# Patient Record
Sex: Female | Born: 1955 | Race: White | Hispanic: No | Marital: Single | Smoking: Current some day smoker
Health system: Southern US, Community
[De-identification: ages and names within clinical notes are randomized; demographics above are authoritative.]

## PROBLEM LIST (undated history)

## (undated) DIAGNOSIS — E785 Hyperlipidemia, unspecified: Secondary | ICD-10-CM

## (undated) DIAGNOSIS — G459 Transient cerebral ischemic attack, unspecified: Secondary | ICD-10-CM

## (undated) DIAGNOSIS — Z720 Tobacco use: Secondary | ICD-10-CM

## (undated) DIAGNOSIS — J449 Chronic obstructive pulmonary disease, unspecified: Secondary | ICD-10-CM

## (undated) DIAGNOSIS — J189 Pneumonia, unspecified organism: Secondary | ICD-10-CM

## (undated) DIAGNOSIS — F419 Anxiety disorder, unspecified: Secondary | ICD-10-CM

## (undated) DIAGNOSIS — I10 Essential (primary) hypertension: Secondary | ICD-10-CM

## (undated) HISTORY — PX: OTHER SURGICAL HISTORY: SHX169

## (undated) HISTORY — DX: Pneumonia, unspecified organism: J18.9

---

## 2005-08-22 ENCOUNTER — Emergency Department: Payer: Self-pay | Admitting: Emergency Medicine

## 2005-08-22 ENCOUNTER — Other Ambulatory Visit: Payer: Self-pay

## 2006-09-15 ENCOUNTER — Emergency Department: Payer: Self-pay | Admitting: Emergency Medicine

## 2006-11-17 ENCOUNTER — Emergency Department: Payer: Self-pay | Admitting: Unknown Physician Specialty

## 2007-06-06 ENCOUNTER — Emergency Department: Payer: Self-pay | Admitting: Emergency Medicine

## 2013-07-24 DIAGNOSIS — F17200 Nicotine dependence, unspecified, uncomplicated: Secondary | ICD-10-CM | POA: Diagnosis not present

## 2013-07-24 DIAGNOSIS — Z8673 Personal history of transient ischemic attack (TIA), and cerebral infarction without residual deficits: Secondary | ICD-10-CM | POA: Diagnosis not present

## 2013-07-24 DIAGNOSIS — J441 Chronic obstructive pulmonary disease with (acute) exacerbation: Secondary | ICD-10-CM | POA: Diagnosis not present

## 2013-07-24 DIAGNOSIS — J449 Chronic obstructive pulmonary disease, unspecified: Secondary | ICD-10-CM | POA: Diagnosis not present

## 2013-07-24 DIAGNOSIS — R079 Chest pain, unspecified: Secondary | ICD-10-CM | POA: Diagnosis not present

## 2013-07-27 DIAGNOSIS — J441 Chronic obstructive pulmonary disease with (acute) exacerbation: Secondary | ICD-10-CM | POA: Diagnosis not present

## 2013-07-27 DIAGNOSIS — F17200 Nicotine dependence, unspecified, uncomplicated: Secondary | ICD-10-CM | POA: Diagnosis not present

## 2013-07-27 DIAGNOSIS — J449 Chronic obstructive pulmonary disease, unspecified: Secondary | ICD-10-CM | POA: Diagnosis not present

## 2013-07-27 DIAGNOSIS — Z86718 Personal history of other venous thrombosis and embolism: Secondary | ICD-10-CM | POA: Diagnosis not present

## 2013-07-27 DIAGNOSIS — Z8673 Personal history of transient ischemic attack (TIA), and cerebral infarction without residual deficits: Secondary | ICD-10-CM | POA: Diagnosis not present

## 2013-07-27 DIAGNOSIS — Z86711 Personal history of pulmonary embolism: Secondary | ICD-10-CM | POA: Diagnosis not present

## 2013-10-24 DIAGNOSIS — J441 Chronic obstructive pulmonary disease with (acute) exacerbation: Secondary | ICD-10-CM | POA: Diagnosis not present

## 2013-10-24 DIAGNOSIS — J440 Chronic obstructive pulmonary disease with acute lower respiratory infection: Secondary | ICD-10-CM | POA: Diagnosis not present

## 2013-10-24 DIAGNOSIS — J449 Chronic obstructive pulmonary disease, unspecified: Secondary | ICD-10-CM | POA: Diagnosis not present

## 2013-10-24 DIAGNOSIS — F17200 Nicotine dependence, unspecified, uncomplicated: Secondary | ICD-10-CM | POA: Diagnosis not present

## 2013-10-24 DIAGNOSIS — R079 Chest pain, unspecified: Secondary | ICD-10-CM | POA: Diagnosis not present

## 2013-10-24 DIAGNOSIS — Z8673 Personal history of transient ischemic attack (TIA), and cerebral infarction without residual deficits: Secondary | ICD-10-CM | POA: Diagnosis not present

## 2013-10-24 DIAGNOSIS — Z882 Allergy status to sulfonamides status: Secondary | ICD-10-CM | POA: Diagnosis not present

## 2013-12-11 ENCOUNTER — Emergency Department: Payer: Self-pay | Admitting: Internal Medicine

## 2013-12-11 DIAGNOSIS — J449 Chronic obstructive pulmonary disease, unspecified: Secondary | ICD-10-CM | POA: Diagnosis not present

## 2013-12-11 DIAGNOSIS — J441 Chronic obstructive pulmonary disease with (acute) exacerbation: Secondary | ICD-10-CM | POA: Diagnosis not present

## 2013-12-11 DIAGNOSIS — J440 Chronic obstructive pulmonary disease with acute lower respiratory infection: Secondary | ICD-10-CM | POA: Diagnosis not present

## 2013-12-11 DIAGNOSIS — J438 Other emphysema: Secondary | ICD-10-CM | POA: Diagnosis not present

## 2013-12-11 DIAGNOSIS — R0602 Shortness of breath: Secondary | ICD-10-CM | POA: Diagnosis not present

## 2013-12-11 DIAGNOSIS — J984 Other disorders of lung: Secondary | ICD-10-CM | POA: Diagnosis not present

## 2013-12-11 DIAGNOSIS — J45901 Unspecified asthma with (acute) exacerbation: Secondary | ICD-10-CM | POA: Diagnosis not present

## 2013-12-11 LAB — CBC
HCT: 43.4 % (ref 35.0–47.0)
HGB: 15.2 g/dL (ref 12.0–16.0)
MCH: 33.5 pg (ref 26.0–34.0)
MCHC: 34.9 g/dL (ref 32.0–36.0)
MCV: 96 fL (ref 80–100)
Platelet: 267 10*3/uL (ref 150–440)
RBC: 4.52 10*6/uL (ref 3.80–5.20)
RDW: 13.7 % (ref 11.5–14.5)
WBC: 10.7 10*3/uL (ref 3.6–11.0)

## 2013-12-11 LAB — COMPREHENSIVE METABOLIC PANEL
ANION GAP: 2 — AB (ref 7–16)
AST: 34 U/L (ref 15–37)
Albumin: 4 g/dL (ref 3.4–5.0)
Alkaline Phosphatase: 89 U/L
BUN: 11 mg/dL (ref 7–18)
Bilirubin,Total: 0.3 mg/dL (ref 0.2–1.0)
CHLORIDE: 107 mmol/L (ref 98–107)
CREATININE: 0.8 mg/dL (ref 0.60–1.30)
Calcium, Total: 8.8 mg/dL (ref 8.5–10.1)
Co2: 27 mmol/L (ref 21–32)
EGFR (African American): >60
GLUCOSE: 96 mg/dL (ref 65–99)
Osmolality: 271 (ref 275–301)
POTASSIUM: 4.3 mmol/L (ref 3.5–5.1)
SGPT (ALT): 19 U/L (ref 12–78)
SODIUM: 136 mmol/L (ref 136–145)
Total Protein: 7.5 g/dL (ref 6.4–8.2)

## 2013-12-11 LAB — TROPONIN I: Troponin-I: <0.02 ng/mL

## 2013-12-17 DIAGNOSIS — Z8673 Personal history of transient ischemic attack (TIA), and cerebral infarction without residual deficits: Secondary | ICD-10-CM | POA: Diagnosis not present

## 2013-12-17 DIAGNOSIS — J441 Chronic obstructive pulmonary disease with (acute) exacerbation: Secondary | ICD-10-CM | POA: Diagnosis not present

## 2013-12-17 DIAGNOSIS — F17200 Nicotine dependence, unspecified, uncomplicated: Secondary | ICD-10-CM | POA: Diagnosis not present

## 2014-01-02 DIAGNOSIS — J438 Other emphysema: Secondary | ICD-10-CM | POA: Diagnosis not present

## 2014-01-02 DIAGNOSIS — F17200 Nicotine dependence, unspecified, uncomplicated: Secondary | ICD-10-CM | POA: Diagnosis not present

## 2014-01-02 DIAGNOSIS — J441 Chronic obstructive pulmonary disease with (acute) exacerbation: Secondary | ICD-10-CM | POA: Diagnosis not present

## 2014-01-02 DIAGNOSIS — Z8673 Personal history of transient ischemic attack (TIA), and cerebral infarction without residual deficits: Secondary | ICD-10-CM | POA: Diagnosis not present

## 2014-01-13 DIAGNOSIS — J441 Chronic obstructive pulmonary disease with (acute) exacerbation: Secondary | ICD-10-CM | POA: Diagnosis not present

## 2014-01-13 DIAGNOSIS — F17200 Nicotine dependence, unspecified, uncomplicated: Secondary | ICD-10-CM | POA: Diagnosis present

## 2014-01-13 DIAGNOSIS — J438 Other emphysema: Secondary | ICD-10-CM | POA: Diagnosis not present

## 2014-01-13 DIAGNOSIS — J984 Other disorders of lung: Secondary | ICD-10-CM | POA: Diagnosis not present

## 2014-01-13 DIAGNOSIS — R062 Wheezing: Secondary | ICD-10-CM | POA: Diagnosis not present

## 2014-01-13 DIAGNOSIS — Z882 Allergy status to sulfonamides status: Secondary | ICD-10-CM | POA: Diagnosis not present

## 2014-01-13 DIAGNOSIS — F411 Generalized anxiety disorder: Secondary | ICD-10-CM | POA: Diagnosis not present

## 2014-01-13 DIAGNOSIS — Z8673 Personal history of transient ischemic attack (TIA), and cerebral infarction without residual deficits: Secondary | ICD-10-CM | POA: Diagnosis not present

## 2014-01-13 DIAGNOSIS — R0902 Hypoxemia: Secondary | ICD-10-CM | POA: Diagnosis not present

## 2014-01-14 DIAGNOSIS — R0902 Hypoxemia: Secondary | ICD-10-CM | POA: Insufficient documentation

## 2014-01-14 DIAGNOSIS — R0689 Other abnormalities of breathing: Secondary | ICD-10-CM | POA: Insufficient documentation

## 2014-02-10 ENCOUNTER — Emergency Department: Payer: Self-pay | Admitting: Internal Medicine

## 2014-02-10 DIAGNOSIS — F17200 Nicotine dependence, unspecified, uncomplicated: Secondary | ICD-10-CM | POA: Diagnosis not present

## 2014-02-10 DIAGNOSIS — J209 Acute bronchitis, unspecified: Secondary | ICD-10-CM | POA: Diagnosis not present

## 2014-02-10 DIAGNOSIS — J441 Chronic obstructive pulmonary disease with (acute) exacerbation: Secondary | ICD-10-CM | POA: Diagnosis not present

## 2014-02-10 DIAGNOSIS — I10 Essential (primary) hypertension: Secondary | ICD-10-CM | POA: Diagnosis not present

## 2014-02-10 DIAGNOSIS — R0602 Shortness of breath: Secondary | ICD-10-CM | POA: Diagnosis not present

## 2014-02-10 DIAGNOSIS — J45909 Unspecified asthma, uncomplicated: Secondary | ICD-10-CM | POA: Diagnosis not present

## 2014-02-10 DIAGNOSIS — Z882 Allergy status to sulfonamides status: Secondary | ICD-10-CM | POA: Diagnosis not present

## 2014-02-10 DIAGNOSIS — J449 Chronic obstructive pulmonary disease, unspecified: Secondary | ICD-10-CM | POA: Diagnosis not present

## 2014-02-10 LAB — COMPREHENSIVE METABOLIC PANEL
ALBUMIN: 3.3 g/dL — AB (ref 3.4–5.0)
ANION GAP: 7 (ref 7–16)
Alkaline Phosphatase: 58 U/L
BUN: 11 mg/dL (ref 7–18)
Bilirubin,Total: 0.2 mg/dL (ref 0.2–1.0)
CALCIUM: 8.6 mg/dL (ref 8.5–10.1)
CO2: 26 mmol/L (ref 21–32)
CREATININE: 0.87 mg/dL (ref 0.60–1.30)
Chloride: 110 mmol/L — ABNORMAL HIGH (ref 98–107)
EGFR (African American): >60
GLUCOSE: 85 mg/dL (ref 65–99)
OSMOLALITY: 284 (ref 275–301)
POTASSIUM: 3.5 mmol/L (ref 3.5–5.1)
SGOT(AST): 15 U/L (ref 15–37)
SGPT (ALT): 17 U/L (ref 12–78)
Sodium: 143 mmol/L (ref 136–145)
TOTAL PROTEIN: 6.1 g/dL — AB (ref 6.4–8.2)

## 2014-02-10 LAB — CBC
HCT: 43 % (ref 35.0–47.0)
HGB: 14 g/dL (ref 12.0–16.0)
MCH: 32.1 pg (ref 26.0–34.0)
MCHC: 32.6 g/dL (ref 32.0–36.0)
MCV: 99 fL (ref 80–100)
Platelet: 306 10*3/uL (ref 150–440)
RBC: 4.36 10*6/uL (ref 3.80–5.20)
RDW: 13.6 % (ref 11.5–14.5)
WBC: 15.9 10*3/uL — ABNORMAL HIGH (ref 3.6–11.0)

## 2014-02-10 LAB — TROPONIN I

## 2014-02-20 DIAGNOSIS — F17200 Nicotine dependence, unspecified, uncomplicated: Secondary | ICD-10-CM | POA: Diagnosis not present

## 2014-02-20 DIAGNOSIS — Z8673 Personal history of transient ischemic attack (TIA), and cerebral infarction without residual deficits: Secondary | ICD-10-CM | POA: Diagnosis not present

## 2014-02-20 DIAGNOSIS — J438 Other emphysema: Secondary | ICD-10-CM | POA: Diagnosis not present

## 2014-02-20 DIAGNOSIS — J441 Chronic obstructive pulmonary disease with (acute) exacerbation: Secondary | ICD-10-CM | POA: Diagnosis not present

## 2014-03-24 ENCOUNTER — Emergency Department: Payer: Self-pay | Admitting: Emergency Medicine

## 2014-03-24 DIAGNOSIS — R079 Chest pain, unspecified: Secondary | ICD-10-CM | POA: Diagnosis not present

## 2014-03-24 DIAGNOSIS — F17200 Nicotine dependence, unspecified, uncomplicated: Secondary | ICD-10-CM | POA: Diagnosis not present

## 2014-03-24 DIAGNOSIS — R0602 Shortness of breath: Secondary | ICD-10-CM | POA: Diagnosis not present

## 2014-03-24 DIAGNOSIS — J441 Chronic obstructive pulmonary disease with (acute) exacerbation: Secondary | ICD-10-CM | POA: Diagnosis not present

## 2014-03-24 DIAGNOSIS — R062 Wheezing: Secondary | ICD-10-CM | POA: Diagnosis not present

## 2014-03-24 DIAGNOSIS — R0609 Other forms of dyspnea: Secondary | ICD-10-CM | POA: Diagnosis not present

## 2014-03-24 LAB — CBC
HCT: 41.6 % (ref 35.0–47.0)
HGB: 14.3 g/dL (ref 12.0–16.0)
MCH: 33.1 pg (ref 26.0–34.0)
MCHC: 34.3 g/dL (ref 32.0–36.0)
MCV: 97 fL (ref 80–100)
Platelet: 280 10*3/uL (ref 150–440)
RBC: 4.31 10*6/uL (ref 3.80–5.20)
RDW: 13.2 % (ref 11.5–14.5)
WBC: 10.4 10*3/uL (ref 3.6–11.0)

## 2014-03-24 LAB — BASIC METABOLIC PANEL
Anion Gap: 7 (ref 7–16)
BUN: 9 mg/dL (ref 7–18)
Calcium, Total: 8.9 mg/dL (ref 8.5–10.1)
Chloride: 107 mmol/L (ref 98–107)
Co2: 24 mmol/L (ref 21–32)
Creatinine: 0.72 mg/dL (ref 0.60–1.30)
Glucose: 114 mg/dL — ABNORMAL HIGH (ref 65–99)
Osmolality: 275 (ref 275–301)
POTASSIUM: 3.8 mmol/L (ref 3.5–5.1)
Sodium: 138 mmol/L (ref 136–145)

## 2014-03-24 LAB — TROPONIN I: Troponin-I: <0.02 ng/mL

## 2014-03-24 LAB — CK TOTAL AND CKMB (NOT AT ARMC)
CK, TOTAL: 96 U/L
CK-MB: 0.7 ng/mL (ref 0.5–3.6)

## 2014-04-09 ENCOUNTER — Inpatient Hospital Stay: Payer: Self-pay | Admitting: Internal Medicine

## 2014-04-09 DIAGNOSIS — F411 Generalized anxiety disorder: Secondary | ICD-10-CM | POA: Diagnosis not present

## 2014-04-09 DIAGNOSIS — Z6825 Body mass index (BMI) 25.0-25.9, adult: Secondary | ICD-10-CM | POA: Diagnosis not present

## 2014-04-09 DIAGNOSIS — R0609 Other forms of dyspnea: Secondary | ICD-10-CM | POA: Diagnosis not present

## 2014-04-09 DIAGNOSIS — R030 Elevated blood-pressure reading, without diagnosis of hypertension: Secondary | ICD-10-CM | POA: Diagnosis not present

## 2014-04-09 DIAGNOSIS — J9620 Acute and chronic respiratory failure, unspecified whether with hypoxia or hypercapnia: Secondary | ICD-10-CM | POA: Diagnosis not present

## 2014-04-09 DIAGNOSIS — J441 Chronic obstructive pulmonary disease with (acute) exacerbation: Secondary | ICD-10-CM | POA: Diagnosis not present

## 2014-04-09 DIAGNOSIS — J438 Other emphysema: Secondary | ICD-10-CM | POA: Diagnosis not present

## 2014-04-09 DIAGNOSIS — F17200 Nicotine dependence, unspecified, uncomplicated: Secondary | ICD-10-CM | POA: Diagnosis present

## 2014-04-09 DIAGNOSIS — Z23 Encounter for immunization: Secondary | ICD-10-CM | POA: Diagnosis not present

## 2014-04-09 DIAGNOSIS — E669 Obesity, unspecified: Secondary | ICD-10-CM | POA: Diagnosis not present

## 2014-04-09 DIAGNOSIS — R0602 Shortness of breath: Secondary | ICD-10-CM | POA: Diagnosis not present

## 2014-04-09 DIAGNOSIS — J9600 Acute respiratory failure, unspecified whether with hypoxia or hypercapnia: Secondary | ICD-10-CM | POA: Diagnosis not present

## 2014-04-09 LAB — CBC
HCT: 44.5 % (ref 35.0–47.0)
HGB: 15 g/dL (ref 12.0–16.0)
MCH: 32.5 pg (ref 26.0–34.0)
MCHC: 33.7 g/dL (ref 32.0–36.0)
MCV: 97 fL (ref 80–100)
PLATELETS: 287 10*3/uL (ref 150–440)
RBC: 4.61 10*6/uL (ref 3.80–5.20)
RDW: 13.1 % (ref 11.5–14.5)
WBC: 14.4 10*3/uL — AB (ref 3.6–11.0)

## 2014-04-09 LAB — COMPREHENSIVE METABOLIC PANEL
ALBUMIN: 3.9 g/dL (ref 3.4–5.0)
ALK PHOS: 74 U/L
ANION GAP: 10 (ref 7–16)
BILIRUBIN TOTAL: 0.3 mg/dL (ref 0.2–1.0)
BUN: 9 mg/dL (ref 7–18)
CHLORIDE: 108 mmol/L — AB (ref 98–107)
CREATININE: 0.91 mg/dL (ref 0.60–1.30)
Calcium, Total: 9.2 mg/dL (ref 8.5–10.1)
Co2: 21 mmol/L (ref 21–32)
EGFR (African American): >60
EGFR (Non-African Amer.): >60
Glucose: 115 mg/dL — ABNORMAL HIGH (ref 65–99)
Osmolality: 277 (ref 275–301)
POTASSIUM: 4.6 mmol/L (ref 3.5–5.1)
SGOT(AST): 19 U/L (ref 15–37)
SGPT (ALT): 22 U/L (ref 12–78)
SODIUM: 139 mmol/L (ref 136–145)
Total Protein: 7.7 g/dL (ref 6.4–8.2)

## 2014-04-09 LAB — CK TOTAL AND CKMB (NOT AT ARMC)
CK, Total: 104 U/L
CK-MB: 1.4 ng/mL (ref 0.5–3.6)

## 2014-04-09 LAB — TROPONIN I: Troponin-I: <0.02 ng/mL

## 2014-04-09 LAB — PRO B NATRIURETIC PEPTIDE: B-Type Natriuretic Peptide: 58 pg/mL (ref 0–125)

## 2014-04-14 LAB — CULTURE, BLOOD (SINGLE)

## 2014-05-22 DIAGNOSIS — F411 Generalized anxiety disorder: Secondary | ICD-10-CM | POA: Diagnosis not present

## 2014-05-22 DIAGNOSIS — J449 Chronic obstructive pulmonary disease, unspecified: Secondary | ICD-10-CM | POA: Diagnosis not present

## 2014-06-30 DIAGNOSIS — J40 Bronchitis, not specified as acute or chronic: Secondary | ICD-10-CM | POA: Diagnosis not present

## 2014-07-04 ENCOUNTER — Emergency Department: Payer: Self-pay | Admitting: Emergency Medicine

## 2014-07-04 DIAGNOSIS — R0602 Shortness of breath: Secondary | ICD-10-CM | POA: Diagnosis not present

## 2014-07-04 DIAGNOSIS — R5383 Other fatigue: Secondary | ICD-10-CM | POA: Diagnosis not present

## 2014-07-04 DIAGNOSIS — Z792 Long term (current) use of antibiotics: Secondary | ICD-10-CM | POA: Diagnosis not present

## 2014-07-04 DIAGNOSIS — J441 Chronic obstructive pulmonary disease with (acute) exacerbation: Secondary | ICD-10-CM | POA: Diagnosis not present

## 2014-07-04 DIAGNOSIS — J449 Chronic obstructive pulmonary disease, unspecified: Secondary | ICD-10-CM | POA: Diagnosis not present

## 2014-07-04 DIAGNOSIS — Z79899 Other long term (current) drug therapy: Secondary | ICD-10-CM | POA: Diagnosis not present

## 2014-07-04 DIAGNOSIS — IMO0002 Reserved for concepts with insufficient information to code with codable children: Secondary | ICD-10-CM | POA: Diagnosis not present

## 2014-07-04 DIAGNOSIS — R5381 Other malaise: Secondary | ICD-10-CM | POA: Diagnosis not present

## 2014-07-04 DIAGNOSIS — R079 Chest pain, unspecified: Secondary | ICD-10-CM | POA: Diagnosis not present

## 2014-07-04 DIAGNOSIS — J45909 Unspecified asthma, uncomplicated: Secondary | ICD-10-CM | POA: Diagnosis not present

## 2014-07-04 DIAGNOSIS — F17200 Nicotine dependence, unspecified, uncomplicated: Secondary | ICD-10-CM | POA: Diagnosis not present

## 2014-07-04 LAB — COMPREHENSIVE METABOLIC PANEL
ALK PHOS: 69 U/L
Albumin: 3.8 g/dL (ref 3.4–5.0)
Anion Gap: 9 (ref 7–16)
BILIRUBIN TOTAL: 0.2 mg/dL (ref 0.2–1.0)
BUN: 18 mg/dL (ref 7–18)
CREATININE: 0.97 mg/dL (ref 0.60–1.30)
Calcium, Total: 8.8 mg/dL (ref 8.5–10.1)
Chloride: 102 mmol/L (ref 98–107)
Co2: 24 mmol/L (ref 21–32)
EGFR (African American): >60
Glucose: 107 mg/dL — ABNORMAL HIGH (ref 65–99)
OSMOLALITY: 272 (ref 275–301)
POTASSIUM: 4 mmol/L (ref 3.5–5.1)
SGOT(AST): 11 U/L — ABNORMAL LOW (ref 15–37)
SGPT (ALT): 17 U/L
SODIUM: 135 mmol/L — AB (ref 136–145)
Total Protein: 7.4 g/dL (ref 6.4–8.2)

## 2014-07-04 LAB — CBC
HCT: 42.7 % (ref 35.0–47.0)
HGB: 13.5 g/dL (ref 12.0–16.0)
MCH: 30.4 pg (ref 26.0–34.0)
MCHC: 31.6 g/dL — AB (ref 32.0–36.0)
MCV: 96 fL (ref 80–100)
Platelet: 324 10*3/uL (ref 150–440)
RBC: 4.44 10*6/uL (ref 3.80–5.20)
RDW: 13.6 % (ref 11.5–14.5)
WBC: 17.9 10*3/uL — ABNORMAL HIGH (ref 3.6–11.0)

## 2014-07-04 LAB — PRO B NATRIURETIC PEPTIDE: B-TYPE NATIURETIC PEPTID: 219 pg/mL — AB (ref 0–125)

## 2014-07-04 LAB — CK TOTAL AND CKMB (NOT AT ARMC)
CK, Total: 141 U/L
CK-MB: 0.9 ng/mL (ref 0.5–3.6)

## 2014-07-04 LAB — TROPONIN I: Troponin-I: <0.02 ng/mL

## 2014-08-17 DIAGNOSIS — E785 Hyperlipidemia, unspecified: Secondary | ICD-10-CM | POA: Diagnosis not present

## 2014-08-17 DIAGNOSIS — R0602 Shortness of breath: Secondary | ICD-10-CM | POA: Diagnosis not present

## 2014-08-17 DIAGNOSIS — D6481 Anemia due to antineoplastic chemotherapy: Secondary | ICD-10-CM | POA: Diagnosis not present

## 2014-08-17 DIAGNOSIS — F17210 Nicotine dependence, cigarettes, uncomplicated: Secondary | ICD-10-CM | POA: Diagnosis not present

## 2014-08-17 DIAGNOSIS — I10 Essential (primary) hypertension: Secondary | ICD-10-CM | POA: Diagnosis not present

## 2014-08-17 DIAGNOSIS — J441 Chronic obstructive pulmonary disease with (acute) exacerbation: Secondary | ICD-10-CM | POA: Diagnosis not present

## 2014-08-17 DIAGNOSIS — Z23 Encounter for immunization: Secondary | ICD-10-CM | POA: Diagnosis not present

## 2014-08-30 DIAGNOSIS — R0602 Shortness of breath: Secondary | ICD-10-CM | POA: Diagnosis not present

## 2014-09-05 DIAGNOSIS — F17210 Nicotine dependence, cigarettes, uncomplicated: Secondary | ICD-10-CM | POA: Diagnosis not present

## 2014-09-05 DIAGNOSIS — E785 Hyperlipidemia, unspecified: Secondary | ICD-10-CM | POA: Diagnosis not present

## 2014-09-05 DIAGNOSIS — F411 Generalized anxiety disorder: Secondary | ICD-10-CM | POA: Diagnosis not present

## 2014-09-05 DIAGNOSIS — R0602 Shortness of breath: Secondary | ICD-10-CM | POA: Diagnosis not present

## 2014-09-05 DIAGNOSIS — I10 Essential (primary) hypertension: Secondary | ICD-10-CM | POA: Diagnosis not present

## 2014-09-21 DIAGNOSIS — R0602 Shortness of breath: Secondary | ICD-10-CM | POA: Diagnosis not present

## 2014-10-12 DIAGNOSIS — Z1231 Encounter for screening mammogram for malignant neoplasm of breast: Secondary | ICD-10-CM | POA: Diagnosis not present

## 2014-10-20 DIAGNOSIS — R928 Other abnormal and inconclusive findings on diagnostic imaging of breast: Secondary | ICD-10-CM | POA: Diagnosis not present

## 2014-10-20 DIAGNOSIS — R922 Inconclusive mammogram: Secondary | ICD-10-CM | POA: Diagnosis not present

## 2014-10-24 DIAGNOSIS — F17210 Nicotine dependence, cigarettes, uncomplicated: Secondary | ICD-10-CM | POA: Diagnosis not present

## 2014-10-24 DIAGNOSIS — R87613 High grade squamous intraepithelial lesion on cytologic smear of cervix (HGSIL): Secondary | ICD-10-CM | POA: Diagnosis not present

## 2014-10-24 DIAGNOSIS — R300 Dysuria: Secondary | ICD-10-CM | POA: Diagnosis not present

## 2014-10-24 DIAGNOSIS — F411 Generalized anxiety disorder: Secondary | ICD-10-CM | POA: Diagnosis not present

## 2014-10-24 DIAGNOSIS — Z0001 Encounter for general adult medical examination with abnormal findings: Secondary | ICD-10-CM | POA: Diagnosis not present

## 2014-10-24 DIAGNOSIS — J441 Chronic obstructive pulmonary disease with (acute) exacerbation: Secondary | ICD-10-CM | POA: Diagnosis not present

## 2014-10-24 DIAGNOSIS — Z124 Encounter for screening for malignant neoplasm of cervix: Secondary | ICD-10-CM | POA: Diagnosis not present

## 2014-10-24 DIAGNOSIS — R87810 Cervical high risk human papillomavirus (HPV) DNA test positive: Secondary | ICD-10-CM | POA: Diagnosis not present

## 2014-11-09 DIAGNOSIS — M8580 Other specified disorders of bone density and structure, unspecified site: Secondary | ICD-10-CM | POA: Diagnosis not present

## 2014-11-09 DIAGNOSIS — M859 Disorder of bone density and structure, unspecified: Secondary | ICD-10-CM | POA: Diagnosis not present

## 2014-12-03 DIAGNOSIS — Z7951 Long term (current) use of inhaled steroids: Secondary | ICD-10-CM | POA: Diagnosis not present

## 2014-12-03 DIAGNOSIS — R21 Rash and other nonspecific skin eruption: Secondary | ICD-10-CM | POA: Diagnosis not present

## 2014-12-03 DIAGNOSIS — J441 Chronic obstructive pulmonary disease with (acute) exacerbation: Secondary | ICD-10-CM | POA: Diagnosis not present

## 2014-12-03 DIAGNOSIS — F17210 Nicotine dependence, cigarettes, uncomplicated: Secondary | ICD-10-CM | POA: Diagnosis not present

## 2014-12-11 DIAGNOSIS — F17210 Nicotine dependence, cigarettes, uncomplicated: Secondary | ICD-10-CM | POA: Diagnosis not present

## 2014-12-11 DIAGNOSIS — J449 Chronic obstructive pulmonary disease, unspecified: Secondary | ICD-10-CM | POA: Diagnosis not present

## 2014-12-11 DIAGNOSIS — J069 Acute upper respiratory infection, unspecified: Secondary | ICD-10-CM | POA: Diagnosis not present

## 2014-12-11 DIAGNOSIS — F411 Generalized anxiety disorder: Secondary | ICD-10-CM | POA: Diagnosis not present

## 2014-12-11 DIAGNOSIS — R05 Cough: Secondary | ICD-10-CM | POA: Diagnosis not present

## 2014-12-11 DIAGNOSIS — I10 Essential (primary) hypertension: Secondary | ICD-10-CM | POA: Diagnosis not present

## 2014-12-29 DIAGNOSIS — R05 Cough: Secondary | ICD-10-CM | POA: Diagnosis not present

## 2014-12-29 DIAGNOSIS — R0602 Shortness of breath: Secondary | ICD-10-CM | POA: Diagnosis not present

## 2014-12-29 DIAGNOSIS — R079 Chest pain, unspecified: Secondary | ICD-10-CM | POA: Diagnosis not present

## 2014-12-29 DIAGNOSIS — J449 Chronic obstructive pulmonary disease, unspecified: Secondary | ICD-10-CM | POA: Diagnosis not present

## 2014-12-29 DIAGNOSIS — M940 Chondrocostal junction syndrome [Tietze]: Secondary | ICD-10-CM | POA: Diagnosis not present

## 2014-12-29 DIAGNOSIS — F17210 Nicotine dependence, cigarettes, uncomplicated: Secondary | ICD-10-CM | POA: Diagnosis not present

## 2015-01-27 NOTE — H&P (Signed)
PATIENT NAME:  Alicia Rivera, Alicia Rivera MR#:  [id number] DATE OF BIRTH:  05/13/1956  DATE OF ADMISSION:  04/09/2014.  PRIMARY CARE PHYSICIAN: None.   CHIEF COMPLAINT: Shortness of breath and wheezing. Alicia Rivera is a 59 year old Caucasian female with history of COPD, asthma, ongoing tobacco abuse, comes to the Emergency Room after she started having increasing shortness of breath today. She came in with severe shortness of breath, chest tightness and wheezing, was placed on BiPAP in the Emergency Room.  Her blood pressure on arrival was 235/133. The patient received IV Solu-Medrol, nebulizer treatment, and IV magnesium, as well. During my evaluation she was feeling better. She was placed on oxygen 4 liters and her oxygen saturation was 96%. She was able to complete a whole sentence. She denies any productive cough or any phlegm. She is being admitted for acute on chronic respiratory failure secondary to COPD exacerbation.   PAST MEDICAL HISTORY:  COPD, asthma.    ALLERGIES: SULFA DRUGS.   FAMILY HISTORY: Positive for hypertension.   SOCIAL HISTORY: She smokes about 1 pack a day. She used to smoke 3 packs a day. Denies any alcohol or drug use.   MEDICATIONS:  1.  DuoNebs 3 mL every six hours as needed.  2.  Ventolin HFA 2 puffs 4 times a day.  3.  Klonopin 0.1 - 1 mg one daily as needed for anxiety.   REVIEW OF SYSTEMS.  CONSTITUTIONAL: No fever, fatigue, weakness.  EYES: No blurred or double vision. No blurred or double vision, glaucoma or cataracts.  ENT: No tinnitus, ear pain, hearing loss or dysphagia.  RESPIRATORY: Positive for chest tightness, COPD/asthma. No productive phlegm. CARDIOVASCULAR: No chest pain, orthopnea, edema or hypertension.  GASTROINTESTINAL: No nausea, vomiting, diarrhea, abdominal pain or GERD.  GENITOURINARY: No dysuria, hematuria, frequency.  ENDOCRINE: No polyuria, nocturia or thyroid problems.  HEMATOLOGY: No anemia or easy bruising.  SKIN: No acne, rash or lesion.   MUSCULOSKELETAL: Positive for arthritis. No swelling or gout.  NEUROLOGIC: No CVA, transient ischemic attack, dysarthria or tremors.  PSYCHIATRIC: No anxiety, depression or bipolar disorder.   All other systems reviewed are negative.   EKG showed sinus tachycardia LAD.    LABORATORY DATA:  White count is 14.4. The rest of the hemogram within normal limits. Cardiac enzymes negative. Comprehensive metabolic panel within normal limits.   Chest x-ray shows no acute cardiopulmonary disease. Positive for emphysema.   ASSESSMENT: Alicia Rivera is a 59 year old with history of chronic obstructive pulmonary disease and asthma with ongoing tobacco abuse, comes in with:  1.  Acute on chronic respiratory failure secondary to chronic obstructive pulmonary disease exacerbation. The patient will be admitted on the medical floor. We will get her off the BiPAP. Continue her on nasal cannula oxygen. We will continue nebulizers around-the-clock along with oral inhalers and IV Solu-Medrol. Empiric antibiotics with IV Levaquin. Wean oxygen as tolerated.  2.  Tobacco abuse. The patient was counseled on smoking cessation, about 4 minutes spent. Does not seem patient is much motivated. She used to smoke 3 packs per day. She is asked to request for nicotine patch if she desires to do so.  3.  Elevated blood pressure without diagnosis of hypertension, likely due to chronic obstructive pulmonary disease exacerbation. Blood pressure during my evaluation was within normal limits. Continue to monitor blood pressure and consider antihypertensives as needed.  4.  DVT prophylaxis with subcutaneous heparin.   The above was discussed with the patient.   TIME SPENT: 50 minutes.  ____________________________ Wylie HailSona A. Allena KatzPatel, MD sap:lt D: 04/09/2014 13:52:15 ET T: 04/09/2014 15:11:58 ET JOB#: [id number]  cc: Isabella Roemmich A. Allena KatzPatel, MD, <Dictator> Willow OraSONA A Skylin Kennerson MD ELECTRONICALLY SIGNED 04/10/2014 10:43

## 2015-01-27 NOTE — Discharge Summary (Signed)
PATIENT NAME:  Alicia Rivera, Carroll A MR#:  [id number] DATE OF BIRTH:  13-Sep-1956  DATE OF ADMISSION:  04/09/2014 DATE OF DISCHARGE:  04/11/2014  PRIMARY CARE PHYSICIAN: None local.   DISCHARGE DIAGNOSES:  1.  Acute respiratory failure due to chronic obstructive pulmonary disease exacerbation.  2.  Tobacco abuse.   CONDITION: Stable.   CODE STATUS: FULL CODE.   HOME MEDICATIONS: Please refer to the medication reconciliation list.   DIET: Low-fat, low-cholesterol diet.   ACTIVITY: As tolerated.   FOLLOW-UP CARE: Follow-up with PCP within 1-2 weeks.   REASON FOR ADMISSION: Increasing shortness of breath with chest tightness and wheezing.   HOSPITAL COURSE: The patient is a 59 year old Caucasian female with a history of COPD,  asthma, and tobacco abuse who came to ED due to increasing shortness of breath, chest tightness, and wheezing. The patient was placed on BiPAP. In the ED, her blood pressure was high at 235 to 133. The patient received IV Solu Medrol, nebulizer, and magnesium, and admitted to medical floor.   For detailed history and physical examination, please refer to the admission note dictated by Dr. Enedina FinnerSona Patel. On admission date, the patient's WBC was 14.4.  Rest of hemogram was within normal limits.  Cardiac enzymes negative. CMP was within normal limits. The chest x-ray did not show any acute cardiopulmonary disease, but positive for emphysema.   1.  Acute respiratory failure secondary to COPD exacerbation. After admission, the patient was put on BiPAP and treated with IV steroids and nebulizer. The patient was off BiPAP yesterday and on home oxygen today. Today, patient is off oxygen by nasal cannula. The patient only has a mild cough. Denies any wheezing or shortness of breath.   2.  Elevated blood pressure is possibly due to COPD exacerbation. Blood pressure is under control.   3.  Patient's blood pressure has been in normal range after treatment of COPD exacerbation. The  patient is not on any hypertension medication.   4.  Patient's symptoms have much improved. Vital signs are stable. She is clinically stable and will be discharged to home today. I discussed the patient's discharge plan with the patient, nurse, case manager.   TIME SPENT: About 35 minutes.    ____________________________ Shaune PollackQing Alyzae Hawkey, MD qc:ts D: 04/11/2014 13:46:28 ET T: 04/11/2014 16:14:41 ET JOB#: [id number]  cc: Shaune PollackQing Edric Fetterman, MD, <Dictator> Shaune PollackQING Quinnetta Roepke MD ELECTRONICALLY SIGNED 04/11/2014 18:03

## 2015-01-29 DIAGNOSIS — F3342 Major depressive disorder, recurrent, in full remission: Secondary | ICD-10-CM | POA: Diagnosis not present

## 2015-01-29 DIAGNOSIS — I10 Essential (primary) hypertension: Secondary | ICD-10-CM | POA: Diagnosis not present

## 2015-01-29 DIAGNOSIS — J449 Chronic obstructive pulmonary disease, unspecified: Secondary | ICD-10-CM | POA: Diagnosis not present

## 2015-01-29 DIAGNOSIS — J309 Allergic rhinitis, unspecified: Secondary | ICD-10-CM | POA: Diagnosis not present

## 2015-04-03 DIAGNOSIS — I6521 Occlusion and stenosis of right carotid artery: Secondary | ICD-10-CM | POA: Diagnosis not present

## 2015-04-03 DIAGNOSIS — J449 Chronic obstructive pulmonary disease, unspecified: Secondary | ICD-10-CM | POA: Diagnosis not present

## 2015-04-03 DIAGNOSIS — R0602 Shortness of breath: Secondary | ICD-10-CM | POA: Diagnosis not present

## 2015-04-03 DIAGNOSIS — I10 Essential (primary) hypertension: Secondary | ICD-10-CM | POA: Diagnosis not present

## 2015-04-26 DIAGNOSIS — Z882 Allergy status to sulfonamides status: Secondary | ICD-10-CM | POA: Diagnosis not present

## 2015-04-26 DIAGNOSIS — J449 Chronic obstructive pulmonary disease, unspecified: Secondary | ICD-10-CM | POA: Diagnosis not present

## 2015-04-26 DIAGNOSIS — R0602 Shortness of breath: Secondary | ICD-10-CM | POA: Diagnosis not present

## 2015-04-26 DIAGNOSIS — I10 Essential (primary) hypertension: Secondary | ICD-10-CM | POA: Diagnosis not present

## 2015-04-26 DIAGNOSIS — F17210 Nicotine dependence, cigarettes, uncomplicated: Secondary | ICD-10-CM | POA: Diagnosis not present

## 2015-04-26 DIAGNOSIS — F419 Anxiety disorder, unspecified: Secondary | ICD-10-CM | POA: Diagnosis not present

## 2015-04-26 DIAGNOSIS — R079 Chest pain, unspecified: Secondary | ICD-10-CM | POA: Diagnosis not present

## 2015-04-26 DIAGNOSIS — Z7951 Long term (current) use of inhaled steroids: Secondary | ICD-10-CM | POA: Diagnosis not present

## 2015-04-26 DIAGNOSIS — Z8673 Personal history of transient ischemic attack (TIA), and cerebral infarction without residual deficits: Secondary | ICD-10-CM | POA: Diagnosis not present

## 2015-04-26 DIAGNOSIS — F410 Panic disorder [episodic paroxysmal anxiety] without agoraphobia: Secondary | ICD-10-CM | POA: Diagnosis not present

## 2015-05-30 DIAGNOSIS — I6523 Occlusion and stenosis of bilateral carotid arteries: Secondary | ICD-10-CM | POA: Diagnosis not present

## 2015-10-04 DIAGNOSIS — J449 Chronic obstructive pulmonary disease, unspecified: Secondary | ICD-10-CM | POA: Diagnosis not present

## 2015-10-04 DIAGNOSIS — F17210 Nicotine dependence, cigarettes, uncomplicated: Secondary | ICD-10-CM | POA: Diagnosis not present

## 2015-10-04 DIAGNOSIS — F411 Generalized anxiety disorder: Secondary | ICD-10-CM | POA: Diagnosis not present

## 2015-10-04 DIAGNOSIS — F3342 Major depressive disorder, recurrent, in full remission: Secondary | ICD-10-CM | POA: Diagnosis not present

## 2015-10-04 DIAGNOSIS — I6523 Occlusion and stenosis of bilateral carotid arteries: Secondary | ICD-10-CM | POA: Diagnosis not present

## 2016-01-26 DIAGNOSIS — R000 Tachycardia, unspecified: Secondary | ICD-10-CM | POA: Diagnosis not present

## 2016-01-26 DIAGNOSIS — F410 Panic disorder [episodic paroxysmal anxiety] without agoraphobia: Secondary | ICD-10-CM | POA: Diagnosis not present

## 2016-01-26 DIAGNOSIS — Z8673 Personal history of transient ischemic attack (TIA), and cerebral infarction without residual deficits: Secondary | ICD-10-CM | POA: Diagnosis not present

## 2016-01-26 DIAGNOSIS — J449 Chronic obstructive pulmonary disease, unspecified: Secondary | ICD-10-CM | POA: Diagnosis not present

## 2016-01-26 DIAGNOSIS — Z7951 Long term (current) use of inhaled steroids: Secondary | ICD-10-CM | POA: Diagnosis not present

## 2016-01-26 DIAGNOSIS — I10 Essential (primary) hypertension: Secondary | ICD-10-CM | POA: Diagnosis not present

## 2016-01-26 DIAGNOSIS — R0789 Other chest pain: Secondary | ICD-10-CM | POA: Diagnosis not present

## 2016-01-26 DIAGNOSIS — F17210 Nicotine dependence, cigarettes, uncomplicated: Secondary | ICD-10-CM | POA: Diagnosis not present

## 2016-01-29 DIAGNOSIS — F411 Generalized anxiety disorder: Secondary | ICD-10-CM | POA: Diagnosis not present

## 2016-01-29 DIAGNOSIS — F3342 Major depressive disorder, recurrent, in full remission: Secondary | ICD-10-CM | POA: Diagnosis not present

## 2016-01-29 DIAGNOSIS — I10 Essential (primary) hypertension: Secondary | ICD-10-CM | POA: Diagnosis not present

## 2016-01-29 DIAGNOSIS — F17210 Nicotine dependence, cigarettes, uncomplicated: Secondary | ICD-10-CM | POA: Diagnosis not present

## 2016-01-29 DIAGNOSIS — E860 Dehydration: Secondary | ICD-10-CM | POA: Diagnosis not present

## 2016-01-29 DIAGNOSIS — J449 Chronic obstructive pulmonary disease, unspecified: Secondary | ICD-10-CM | POA: Diagnosis not present

## 2016-02-28 DIAGNOSIS — Z23 Encounter for immunization: Secondary | ICD-10-CM | POA: Diagnosis not present

## 2016-02-28 DIAGNOSIS — F17210 Nicotine dependence, cigarettes, uncomplicated: Secondary | ICD-10-CM | POA: Diagnosis not present

## 2016-02-28 DIAGNOSIS — J449 Chronic obstructive pulmonary disease, unspecified: Secondary | ICD-10-CM | POA: Diagnosis not present

## 2016-02-28 DIAGNOSIS — F3342 Major depressive disorder, recurrent, in full remission: Secondary | ICD-10-CM | POA: Diagnosis not present

## 2016-02-28 DIAGNOSIS — I10 Essential (primary) hypertension: Secondary | ICD-10-CM | POA: Diagnosis not present

## 2016-02-28 DIAGNOSIS — Z0001 Encounter for general adult medical examination with abnormal findings: Secondary | ICD-10-CM | POA: Diagnosis not present

## 2016-02-28 DIAGNOSIS — F411 Generalized anxiety disorder: Secondary | ICD-10-CM | POA: Diagnosis not present

## 2016-04-07 DIAGNOSIS — Z1231 Encounter for screening mammogram for malignant neoplasm of breast: Secondary | ICD-10-CM | POA: Diagnosis not present

## 2016-07-09 DIAGNOSIS — F17210 Nicotine dependence, cigarettes, uncomplicated: Secondary | ICD-10-CM | POA: Diagnosis not present

## 2016-07-09 DIAGNOSIS — J441 Chronic obstructive pulmonary disease with (acute) exacerbation: Secondary | ICD-10-CM | POA: Diagnosis not present

## 2016-07-09 DIAGNOSIS — F3342 Major depressive disorder, recurrent, in full remission: Secondary | ICD-10-CM | POA: Diagnosis not present

## 2016-07-09 DIAGNOSIS — F411 Generalized anxiety disorder: Secondary | ICD-10-CM | POA: Diagnosis not present

## 2016-07-10 ENCOUNTER — Ambulatory Visit
Admission: RE | Admit: 2016-07-10 | Discharge: 2016-07-10 | Disposition: A | Discharge status: Home / Self Care | Payer: Medicare Other | Source: Ambulatory Visit | Attending: Physician Assistant | Admitting: Physician Assistant

## 2016-07-10 ENCOUNTER — Other Ambulatory Visit: Payer: Self-pay | Admitting: Physician Assistant

## 2016-07-10 DIAGNOSIS — R05 Cough: Secondary | ICD-10-CM | POA: Insufficient documentation

## 2016-07-10 DIAGNOSIS — R062 Wheezing: Secondary | ICD-10-CM

## 2016-07-10 DIAGNOSIS — R059 Cough, unspecified: Secondary | ICD-10-CM

## 2016-07-10 DIAGNOSIS — R0602 Shortness of breath: Secondary | ICD-10-CM | POA: Diagnosis not present

## 2016-07-17 DIAGNOSIS — R0602 Shortness of breath: Secondary | ICD-10-CM | POA: Diagnosis not present

## 2016-07-17 DIAGNOSIS — Z23 Encounter for immunization: Secondary | ICD-10-CM | POA: Diagnosis not present

## 2016-07-17 DIAGNOSIS — F17210 Nicotine dependence, cigarettes, uncomplicated: Secondary | ICD-10-CM | POA: Diagnosis not present

## 2016-07-17 DIAGNOSIS — J449 Chronic obstructive pulmonary disease, unspecified: Secondary | ICD-10-CM | POA: Diagnosis not present

## 2016-08-06 DIAGNOSIS — R0602 Shortness of breath: Secondary | ICD-10-CM | POA: Diagnosis not present

## 2016-08-18 DIAGNOSIS — F17210 Nicotine dependence, cigarettes, uncomplicated: Secondary | ICD-10-CM | POA: Diagnosis not present

## 2016-08-18 DIAGNOSIS — J449 Chronic obstructive pulmonary disease, unspecified: Secondary | ICD-10-CM | POA: Diagnosis not present

## 2016-08-18 DIAGNOSIS — R0602 Shortness of breath: Secondary | ICD-10-CM | POA: Diagnosis not present

## 2016-11-04 DIAGNOSIS — F17210 Nicotine dependence, cigarettes, uncomplicated: Secondary | ICD-10-CM | POA: Diagnosis not present

## 2016-11-04 DIAGNOSIS — R0602 Shortness of breath: Secondary | ICD-10-CM | POA: Diagnosis not present

## 2016-11-04 DIAGNOSIS — J449 Chronic obstructive pulmonary disease, unspecified: Secondary | ICD-10-CM | POA: Diagnosis not present

## 2016-12-11 DIAGNOSIS — Z79899 Other long term (current) drug therapy: Secondary | ICD-10-CM | POA: Diagnosis not present

## 2016-12-11 DIAGNOSIS — Z7951 Long term (current) use of inhaled steroids: Secondary | ICD-10-CM | POA: Diagnosis not present

## 2016-12-11 DIAGNOSIS — F410 Panic disorder [episodic paroxysmal anxiety] without agoraphobia: Secondary | ICD-10-CM | POA: Diagnosis not present

## 2016-12-11 DIAGNOSIS — I10 Essential (primary) hypertension: Secondary | ICD-10-CM | POA: Diagnosis not present

## 2016-12-11 DIAGNOSIS — R0789 Other chest pain: Secondary | ICD-10-CM | POA: Diagnosis not present

## 2016-12-11 DIAGNOSIS — F319 Bipolar disorder, unspecified: Secondary | ICD-10-CM | POA: Diagnosis not present

## 2016-12-11 DIAGNOSIS — R531 Weakness: Secondary | ICD-10-CM | POA: Diagnosis not present

## 2016-12-11 DIAGNOSIS — J449 Chronic obstructive pulmonary disease, unspecified: Secondary | ICD-10-CM | POA: Diagnosis not present

## 2016-12-11 DIAGNOSIS — F17210 Nicotine dependence, cigarettes, uncomplicated: Secondary | ICD-10-CM | POA: Diagnosis not present

## 2016-12-11 DIAGNOSIS — Z8673 Personal history of transient ischemic attack (TIA), and cerebral infarction without residual deficits: Secondary | ICD-10-CM | POA: Diagnosis not present

## 2017-03-04 DIAGNOSIS — Z79899 Other long term (current) drug therapy: Secondary | ICD-10-CM | POA: Diagnosis not present

## 2017-03-04 DIAGNOSIS — J441 Chronic obstructive pulmonary disease with (acute) exacerbation: Secondary | ICD-10-CM | POA: Diagnosis not present

## 2017-03-04 DIAGNOSIS — I10 Essential (primary) hypertension: Secondary | ICD-10-CM | POA: Diagnosis not present

## 2017-03-04 DIAGNOSIS — Z8673 Personal history of transient ischemic attack (TIA), and cerebral infarction without residual deficits: Secondary | ICD-10-CM | POA: Diagnosis not present

## 2017-03-04 DIAGNOSIS — F17210 Nicotine dependence, cigarettes, uncomplicated: Secondary | ICD-10-CM | POA: Diagnosis not present

## 2017-03-04 DIAGNOSIS — R0602 Shortness of breath: Secondary | ICD-10-CM | POA: Diagnosis not present

## 2017-03-16 DIAGNOSIS — J449 Chronic obstructive pulmonary disease, unspecified: Secondary | ICD-10-CM | POA: Diagnosis not present

## 2017-03-16 DIAGNOSIS — J309 Allergic rhinitis, unspecified: Secondary | ICD-10-CM | POA: Diagnosis not present

## 2017-03-16 DIAGNOSIS — F17210 Nicotine dependence, cigarettes, uncomplicated: Secondary | ICD-10-CM | POA: Diagnosis not present

## 2017-04-08 DIAGNOSIS — R0600 Dyspnea, unspecified: Secondary | ICD-10-CM | POA: Diagnosis not present

## 2017-04-08 DIAGNOSIS — R251 Tremor, unspecified: Secondary | ICD-10-CM | POA: Diagnosis not present

## 2017-04-08 DIAGNOSIS — J441 Chronic obstructive pulmonary disease with (acute) exacerbation: Secondary | ICD-10-CM | POA: Diagnosis not present

## 2017-04-08 DIAGNOSIS — Z882 Allergy status to sulfonamides status: Secondary | ICD-10-CM | POA: Diagnosis not present

## 2017-04-08 DIAGNOSIS — F17210 Nicotine dependence, cigarettes, uncomplicated: Secondary | ICD-10-CM | POA: Diagnosis not present

## 2017-04-08 DIAGNOSIS — Z7951 Long term (current) use of inhaled steroids: Secondary | ICD-10-CM | POA: Diagnosis not present

## 2017-04-08 DIAGNOSIS — F410 Panic disorder [episodic paroxysmal anxiety] without agoraphobia: Secondary | ICD-10-CM | POA: Diagnosis not present

## 2017-04-08 DIAGNOSIS — Z8673 Personal history of transient ischemic attack (TIA), and cerebral infarction without residual deficits: Secondary | ICD-10-CM | POA: Diagnosis not present

## 2017-04-08 DIAGNOSIS — I10 Essential (primary) hypertension: Secondary | ICD-10-CM | POA: Diagnosis not present

## 2017-04-08 DIAGNOSIS — R0602 Shortness of breath: Secondary | ICD-10-CM | POA: Diagnosis not present

## 2017-04-12 DIAGNOSIS — Z8673 Personal history of transient ischemic attack (TIA), and cerebral infarction without residual deficits: Secondary | ICD-10-CM | POA: Diagnosis not present

## 2017-04-12 DIAGNOSIS — R911 Solitary pulmonary nodule: Secondary | ICD-10-CM | POA: Diagnosis not present

## 2017-04-12 DIAGNOSIS — R079 Chest pain, unspecified: Secondary | ICD-10-CM | POA: Diagnosis not present

## 2017-04-12 DIAGNOSIS — F419 Anxiety disorder, unspecified: Secondary | ICD-10-CM | POA: Diagnosis not present

## 2017-04-12 DIAGNOSIS — R0602 Shortness of breath: Secondary | ICD-10-CM | POA: Diagnosis not present

## 2017-04-12 DIAGNOSIS — J441 Chronic obstructive pulmonary disease with (acute) exacerbation: Secondary | ICD-10-CM | POA: Diagnosis not present

## 2017-04-12 DIAGNOSIS — Z7951 Long term (current) use of inhaled steroids: Secondary | ICD-10-CM | POA: Diagnosis not present

## 2017-04-12 DIAGNOSIS — I10 Essential (primary) hypertension: Secondary | ICD-10-CM | POA: Diagnosis not present

## 2017-04-12 DIAGNOSIS — R0600 Dyspnea, unspecified: Secondary | ICD-10-CM | POA: Diagnosis not present

## 2017-04-12 DIAGNOSIS — R0781 Pleurodynia: Secondary | ICD-10-CM | POA: Diagnosis not present

## 2017-04-12 DIAGNOSIS — F17210 Nicotine dependence, cigarettes, uncomplicated: Secondary | ICD-10-CM | POA: Diagnosis not present

## 2017-04-12 DIAGNOSIS — Z882 Allergy status to sulfonamides status: Secondary | ICD-10-CM | POA: Diagnosis not present

## 2017-04-13 DIAGNOSIS — J449 Chronic obstructive pulmonary disease, unspecified: Secondary | ICD-10-CM | POA: Diagnosis not present

## 2017-04-13 DIAGNOSIS — Z882 Allergy status to sulfonamides status: Secondary | ICD-10-CM | POA: Diagnosis not present

## 2017-04-13 DIAGNOSIS — I10 Essential (primary) hypertension: Secondary | ICD-10-CM | POA: Diagnosis not present

## 2017-04-13 DIAGNOSIS — R0602 Shortness of breath: Secondary | ICD-10-CM | POA: Diagnosis not present

## 2017-04-13 DIAGNOSIS — R0789 Other chest pain: Secondary | ICD-10-CM | POA: Diagnosis not present

## 2017-04-13 DIAGNOSIS — F17210 Nicotine dependence, cigarettes, uncomplicated: Secondary | ICD-10-CM | POA: Diagnosis not present

## 2017-04-13 DIAGNOSIS — F419 Anxiety disorder, unspecified: Secondary | ICD-10-CM | POA: Diagnosis not present

## 2017-04-13 DIAGNOSIS — Z8673 Personal history of transient ischemic attack (TIA), and cerebral infarction without residual deficits: Secondary | ICD-10-CM | POA: Diagnosis not present

## 2017-04-13 DIAGNOSIS — I4581 Long QT syndrome: Secondary | ICD-10-CM | POA: Diagnosis not present

## 2017-04-15 ENCOUNTER — Encounter: Payer: Self-pay | Admitting: Emergency Medicine

## 2017-04-15 ENCOUNTER — Inpatient Hospital Stay
Admission: EM | Admit: 2017-04-15 | Discharge: 2017-04-23 | DRG: 208 | Disposition: A | Discharge status: Home Health | Payer: Medicare Other | Attending: Specialist | Admitting: Specialist

## 2017-04-15 DIAGNOSIS — N179 Acute kidney failure, unspecified: Secondary | ICD-10-CM | POA: Diagnosis present

## 2017-04-15 DIAGNOSIS — D72829 Elevated white blood cell count, unspecified: Secondary | ICD-10-CM | POA: Diagnosis present

## 2017-04-15 DIAGNOSIS — I10 Essential (primary) hypertension: Secondary | ICD-10-CM | POA: Diagnosis present

## 2017-04-15 DIAGNOSIS — Z01818 Encounter for other preprocedural examination: Secondary | ICD-10-CM

## 2017-04-15 DIAGNOSIS — I161 Hypertensive emergency: Secondary | ICD-10-CM | POA: Diagnosis present

## 2017-04-15 DIAGNOSIS — T4275XA Adverse effect of unspecified antiepileptic and sedative-hypnotic drugs, initial encounter: Secondary | ICD-10-CM | POA: Diagnosis not present

## 2017-04-15 DIAGNOSIS — J9601 Acute respiratory failure with hypoxia: Secondary | ICD-10-CM | POA: Diagnosis not present

## 2017-04-15 DIAGNOSIS — Z825 Family history of asthma and other chronic lower respiratory diseases: Secondary | ICD-10-CM

## 2017-04-15 DIAGNOSIS — Z7189 Other specified counseling: Secondary | ICD-10-CM

## 2017-04-15 DIAGNOSIS — Z978 Presence of other specified devices: Secondary | ICD-10-CM

## 2017-04-15 DIAGNOSIS — K219 Gastro-esophageal reflux disease without esophagitis: Secondary | ICD-10-CM | POA: Diagnosis present

## 2017-04-15 DIAGNOSIS — E861 Hypovolemia: Secondary | ICD-10-CM | POA: Diagnosis present

## 2017-04-15 DIAGNOSIS — Z452 Encounter for adjustment and management of vascular access device: Secondary | ICD-10-CM

## 2017-04-15 DIAGNOSIS — Z79899 Other long term (current) drug therapy: Secondary | ICD-10-CM

## 2017-04-15 DIAGNOSIS — E873 Alkalosis: Secondary | ICD-10-CM | POA: Diagnosis present

## 2017-04-15 DIAGNOSIS — Z792 Long term (current) use of antibiotics: Secondary | ICD-10-CM

## 2017-04-15 DIAGNOSIS — G92 Toxic encephalopathy: Secondary | ICD-10-CM | POA: Diagnosis not present

## 2017-04-15 DIAGNOSIS — J9622 Acute and chronic respiratory failure with hypercapnia: Secondary | ICD-10-CM | POA: Diagnosis not present

## 2017-04-15 DIAGNOSIS — J9621 Acute and chronic respiratory failure with hypoxia: Secondary | ICD-10-CM | POA: Diagnosis not present

## 2017-04-15 DIAGNOSIS — K5900 Constipation, unspecified: Secondary | ICD-10-CM

## 2017-04-15 DIAGNOSIS — F419 Anxiety disorder, unspecified: Secondary | ICD-10-CM | POA: Diagnosis present

## 2017-04-15 DIAGNOSIS — E876 Hypokalemia: Secondary | ICD-10-CM | POA: Diagnosis present

## 2017-04-15 DIAGNOSIS — E222 Syndrome of inappropriate secretion of antidiuretic hormone: Secondary | ICD-10-CM | POA: Diagnosis present

## 2017-04-15 DIAGNOSIS — G9340 Encephalopathy, unspecified: Secondary | ICD-10-CM

## 2017-04-15 DIAGNOSIS — R0602 Shortness of breath: Secondary | ICD-10-CM | POA: Diagnosis not present

## 2017-04-15 DIAGNOSIS — J9801 Acute bronchospasm: Secondary | ICD-10-CM | POA: Diagnosis present

## 2017-04-15 DIAGNOSIS — J441 Chronic obstructive pulmonary disease with (acute) exacerbation: Secondary | ICD-10-CM | POA: Diagnosis not present

## 2017-04-15 DIAGNOSIS — Z7951 Long term (current) use of inhaled steroids: Secondary | ICD-10-CM

## 2017-04-15 DIAGNOSIS — Y92009 Unspecified place in unspecified non-institutional (private) residence as the place of occurrence of the external cause: Secondary | ICD-10-CM

## 2017-04-15 DIAGNOSIS — F17200 Nicotine dependence, unspecified, uncomplicated: Secondary | ICD-10-CM

## 2017-04-15 DIAGNOSIS — R739 Hyperglycemia, unspecified: Secondary | ICD-10-CM | POA: Diagnosis present

## 2017-04-15 DIAGNOSIS — R4182 Altered mental status, unspecified: Secondary | ICD-10-CM

## 2017-04-15 DIAGNOSIS — Z4659 Encounter for fitting and adjustment of other gastrointestinal appliance and device: Secondary | ICD-10-CM

## 2017-04-15 DIAGNOSIS — Z7952 Long term (current) use of systemic steroids: Secondary | ICD-10-CM

## 2017-04-15 DIAGNOSIS — Y92230 Patient room in hospital as the place of occurrence of the external cause: Secondary | ICD-10-CM | POA: Diagnosis not present

## 2017-04-15 DIAGNOSIS — Z6831 Body mass index (BMI) 31.0-31.9, adult: Secondary | ICD-10-CM

## 2017-04-15 DIAGNOSIS — F329 Major depressive disorder, single episode, unspecified: Secondary | ICD-10-CM | POA: Diagnosis present

## 2017-04-15 DIAGNOSIS — Z515 Encounter for palliative care: Secondary | ICD-10-CM

## 2017-04-15 DIAGNOSIS — F17210 Nicotine dependence, cigarettes, uncomplicated: Secondary | ICD-10-CM | POA: Diagnosis present

## 2017-04-15 DIAGNOSIS — E669 Obesity, unspecified: Secondary | ICD-10-CM | POA: Diagnosis present

## 2017-04-15 DIAGNOSIS — J9690 Respiratory failure, unspecified, unspecified whether with hypoxia or hypercapnia: Secondary | ICD-10-CM

## 2017-04-15 DIAGNOSIS — Z882 Allergy status to sulfonamides status: Secondary | ICD-10-CM

## 2017-04-15 DIAGNOSIS — T380X5A Adverse effect of glucocorticoids and synthetic analogues, initial encounter: Secondary | ICD-10-CM | POA: Diagnosis present

## 2017-04-15 HISTORY — DX: Essential (primary) hypertension: I10

## 2017-04-15 HISTORY — DX: Chronic obstructive pulmonary disease, unspecified: J44.9

## 2017-04-15 HISTORY — DX: Tobacco use: Z72.0

## 2017-04-15 MED ORDER — ALBUTEROL SULFATE (2.5 MG/3ML) 0.083% IN NEBU
2.5000 mg | INHALATION_SOLUTION | Freq: Once | RESPIRATORY_TRACT | Status: AC
  Administered 2017-04-16: 2.5 mg via RESPIRATORY_TRACT

## 2017-04-15 MED ORDER — NITROGLYCERIN IN D5W 200-5 MCG/ML-% IV SOLN
0.0000 ug/min | Freq: Once | INTRAVENOUS | Status: AC
  Administered 2017-04-15: 100 ug/min via INTRAVENOUS

## 2017-04-15 MED ORDER — ALBUTEROL SULFATE (2.5 MG/3ML) 0.083% IN NEBU
INHALATION_SOLUTION | RESPIRATORY_TRACT | Status: AC
  Administered 2017-04-16: 2.5 mg via RESPIRATORY_TRACT
  Filled 2017-04-15: qty 3

## 2017-04-15 MED ORDER — NITROGLYCERIN IN D5W 200-5 MCG/ML-% IV SOLN
INTRAVENOUS | Status: AC
  Administered 2017-04-15: 100 ug/min via INTRAVENOUS
  Filled 2017-04-15: qty 250

## 2017-04-15 NOTE — ED Triage Notes (Signed)
Pt arrived to the ED [facility] from home for complaints of SOB since the 4th of July. According to EMS the Pt was in severe respiratory distress upon arrival and they treated the Pt with 125mg  of Solumedrol, 7.5mg  of Albuterol, 1mg  of Ipatropium and 1in of Nitro paste With BIPAP. Pt is AOx4 in moderate respiratory distress upon arrival, Dr. York CeriseForbach at bedside.

## 2017-04-16 ENCOUNTER — Inpatient Hospital Stay: Payer: Medicare Other

## 2017-04-16 ENCOUNTER — Encounter: Payer: Self-pay | Admitting: Internal Medicine

## 2017-04-16 ENCOUNTER — Emergency Department: Payer: Medicare Other

## 2017-04-16 DIAGNOSIS — G9340 Encephalopathy, unspecified: Secondary | ICD-10-CM | POA: Diagnosis not present

## 2017-04-16 DIAGNOSIS — E876 Hypokalemia: Secondary | ICD-10-CM | POA: Diagnosis present

## 2017-04-16 DIAGNOSIS — J9622 Acute and chronic respiratory failure with hypercapnia: Secondary | ICD-10-CM | POA: Diagnosis not present

## 2017-04-16 DIAGNOSIS — Y92009 Unspecified place in unspecified non-institutional (private) residence as the place of occurrence of the external cause: Secondary | ICD-10-CM | POA: Diagnosis not present

## 2017-04-16 DIAGNOSIS — I161 Hypertensive emergency: Secondary | ICD-10-CM | POA: Diagnosis present

## 2017-04-16 DIAGNOSIS — R4182 Altered mental status, unspecified: Secondary | ICD-10-CM | POA: Diagnosis not present

## 2017-04-16 DIAGNOSIS — R0602 Shortness of breath: Secondary | ICD-10-CM | POA: Diagnosis not present

## 2017-04-16 DIAGNOSIS — Z4682 Encounter for fitting and adjustment of non-vascular catheter: Secondary | ICD-10-CM | POA: Diagnosis not present

## 2017-04-16 DIAGNOSIS — J9600 Acute respiratory failure, unspecified whether with hypoxia or hypercapnia: Secondary | ICD-10-CM | POA: Diagnosis not present

## 2017-04-16 DIAGNOSIS — J9621 Acute and chronic respiratory failure with hypoxia: Secondary | ICD-10-CM | POA: Diagnosis not present

## 2017-04-16 DIAGNOSIS — F17200 Nicotine dependence, unspecified, uncomplicated: Secondary | ICD-10-CM | POA: Diagnosis not present

## 2017-04-16 DIAGNOSIS — Z515 Encounter for palliative care: Secondary | ICD-10-CM | POA: Diagnosis not present

## 2017-04-16 DIAGNOSIS — J9690 Respiratory failure, unspecified, unspecified whether with hypoxia or hypercapnia: Secondary | ICD-10-CM | POA: Diagnosis not present

## 2017-04-16 DIAGNOSIS — R410 Disorientation, unspecified: Secondary | ICD-10-CM | POA: Diagnosis not present

## 2017-04-16 DIAGNOSIS — N179 Acute kidney failure, unspecified: Secondary | ICD-10-CM | POA: Diagnosis not present

## 2017-04-16 DIAGNOSIS — J9601 Acute respiratory failure with hypoxia: Secondary | ICD-10-CM | POA: Diagnosis not present

## 2017-04-16 DIAGNOSIS — Z79899 Other long term (current) drug therapy: Secondary | ICD-10-CM | POA: Diagnosis not present

## 2017-04-16 DIAGNOSIS — G92 Toxic encephalopathy: Secondary | ICD-10-CM | POA: Diagnosis not present

## 2017-04-16 DIAGNOSIS — J441 Chronic obstructive pulmonary disease with (acute) exacerbation: Secondary | ICD-10-CM | POA: Diagnosis not present

## 2017-04-16 DIAGNOSIS — J9602 Acute respiratory failure with hypercapnia: Secondary | ICD-10-CM | POA: Diagnosis not present

## 2017-04-16 DIAGNOSIS — E871 Hypo-osmolality and hyponatremia: Secondary | ICD-10-CM | POA: Diagnosis not present

## 2017-04-16 DIAGNOSIS — Z825 Family history of asthma and other chronic lower respiratory diseases: Secondary | ICD-10-CM | POA: Diagnosis not present

## 2017-04-16 DIAGNOSIS — J449 Chronic obstructive pulmonary disease, unspecified: Secondary | ICD-10-CM | POA: Diagnosis not present

## 2017-04-16 DIAGNOSIS — Z792 Long term (current) use of antibiotics: Secondary | ICD-10-CM | POA: Diagnosis not present

## 2017-04-16 DIAGNOSIS — T380X5A Adverse effect of glucocorticoids and synthetic analogues, initial encounter: Secondary | ICD-10-CM | POA: Diagnosis present

## 2017-04-16 DIAGNOSIS — Z01818 Encounter for other preprocedural examination: Secondary | ICD-10-CM

## 2017-04-16 DIAGNOSIS — Z7952 Long term (current) use of systemic steroids: Secondary | ICD-10-CM | POA: Diagnosis not present

## 2017-04-16 DIAGNOSIS — Z7951 Long term (current) use of inhaled steroids: Secondary | ICD-10-CM | POA: Diagnosis not present

## 2017-04-16 DIAGNOSIS — Y92230 Patient room in hospital as the place of occurrence of the external cause: Secondary | ICD-10-CM | POA: Diagnosis not present

## 2017-04-16 DIAGNOSIS — E873 Alkalosis: Secondary | ICD-10-CM | POA: Diagnosis present

## 2017-04-16 DIAGNOSIS — G9341 Metabolic encephalopathy: Secondary | ICD-10-CM | POA: Diagnosis not present

## 2017-04-16 DIAGNOSIS — F329 Major depressive disorder, single episode, unspecified: Secondary | ICD-10-CM | POA: Diagnosis present

## 2017-04-16 DIAGNOSIS — Z882 Allergy status to sulfonamides status: Secondary | ICD-10-CM | POA: Diagnosis not present

## 2017-04-16 DIAGNOSIS — I10 Essential (primary) hypertension: Secondary | ICD-10-CM | POA: Diagnosis present

## 2017-04-16 DIAGNOSIS — K219 Gastro-esophageal reflux disease without esophagitis: Secondary | ICD-10-CM | POA: Diagnosis present

## 2017-04-16 DIAGNOSIS — Z7189 Other specified counseling: Secondary | ICD-10-CM | POA: Diagnosis not present

## 2017-04-16 DIAGNOSIS — Z452 Encounter for adjustment and management of vascular access device: Secondary | ICD-10-CM | POA: Diagnosis not present

## 2017-04-16 DIAGNOSIS — E861 Hypovolemia: Secondary | ICD-10-CM | POA: Diagnosis present

## 2017-04-16 DIAGNOSIS — F419 Anxiety disorder, unspecified: Secondary | ICD-10-CM | POA: Diagnosis present

## 2017-04-16 DIAGNOSIS — J9801 Acute bronchospasm: Secondary | ICD-10-CM | POA: Diagnosis present

## 2017-04-16 DIAGNOSIS — D72829 Elevated white blood cell count, unspecified: Secondary | ICD-10-CM | POA: Diagnosis present

## 2017-04-16 DIAGNOSIS — E222 Syndrome of inappropriate secretion of antidiuretic hormone: Secondary | ICD-10-CM | POA: Diagnosis present

## 2017-04-16 DIAGNOSIS — R109 Unspecified abdominal pain: Secondary | ICD-10-CM | POA: Diagnosis not present

## 2017-04-16 DIAGNOSIS — R739 Hyperglycemia, unspecified: Secondary | ICD-10-CM | POA: Diagnosis present

## 2017-04-16 LAB — BLOOD GAS, ARTERIAL
ACID-BASE DEFICIT: 2.2 mmol/L — AB (ref 0.0–2.0)
ACID-BASE EXCESS: 0 mmol/L (ref 0.0–2.0)
ALLENS TEST (PASS/FAIL): POSITIVE — AB
Bicarbonate: 21.8 mmol/L (ref 20.0–28.0)
Bicarbonate: 26 mmol/L (ref 20.0–28.0)
Delivery systems: POSITIVE
Expiratory PAP: 5
FIO2: 0.3
FIO2: 0.4
INSPIRATORY PAP: 15
MECHVT: 450 mL
Mechanical Rate: 8
O2 SAT: 97.4 %
O2 SAT: 99.6 %
PATIENT TEMPERATURE: 37
PCO2 ART: 28 mmHg — AB (ref 32.0–48.0)
PCO2 ART: 58 mmHg — AB (ref 32.0–48.0)
PEEP: 5 cmH2O
PH ART: 7.26 — AB (ref 7.350–7.450)
PH ART: 7.5 — AB (ref 7.350–7.450)
PO2 ART: 169 mmHg — AB (ref 83.0–108.0)
Patient temperature: 37
RATE: 15 resp/min
pO2, Arterial: 108 mmHg (ref 83.0–108.0)

## 2017-04-16 LAB — CBC WITH DIFFERENTIAL/PLATELET
Basophils Absolute: 0.1 10*3/uL (ref 0–0.1)
Basophils Relative: 1 %
Eosinophils Absolute: 0 10*3/uL (ref 0–0.7)
Eosinophils Relative: 0 %
HCT: 44.9 % (ref 35.0–47.0)
HEMOGLOBIN: 15.7 g/dL (ref 12.0–16.0)
LYMPHS PCT: 17 %
Lymphs Abs: 2.8 10*3/uL (ref 1.0–3.6)
MCH: 31.7 pg (ref 26.0–34.0)
MCHC: 34.8 g/dL (ref 32.0–36.0)
MCV: 90.9 fL (ref 80.0–100.0)
Monocytes Absolute: 0.8 10*3/uL (ref 0.2–0.9)
Monocytes Relative: 5 %
NEUTROS ABS: 12.8 10*3/uL — AB (ref 1.4–6.5)
NEUTROS PCT: 77 %
Platelets: 389 10*3/uL (ref 150–440)
RBC: 4.94 MIL/uL (ref 3.80–5.20)
RDW: 13.1 % (ref 11.5–14.5)
WBC: 16.5 10*3/uL — AB (ref 3.6–11.0)

## 2017-04-16 LAB — PROTIME-INR
INR: 0.91
Prothrombin Time: 12.2 seconds (ref 11.4–15.2)

## 2017-04-16 LAB — COMPREHENSIVE METABOLIC PANEL
ALT: 21 U/L (ref 14–54)
AST: 36 U/L (ref 15–41)
Albumin: 4.4 g/dL (ref 3.5–5.0)
Alkaline Phosphatase: 80 U/L (ref 38–126)
Anion gap: 15 (ref 5–15)
BUN: 7 mg/dL (ref 6–20)
CO2: 22 mmol/L (ref 22–32)
Calcium: 9.5 mg/dL (ref 8.9–10.3)
Chloride: 86 mmol/L — ABNORMAL LOW (ref 101–111)
Creatinine, Ser: 0.74 mg/dL (ref 0.44–1.00)
Glucose, Bld: 136 mg/dL — ABNORMAL HIGH (ref 65–99)
POTASSIUM: 3.8 mmol/L (ref 3.5–5.1)
Sodium: 123 mmol/L — ABNORMAL LOW (ref 135–145)
Total Bilirubin: 0.6 mg/dL (ref 0.3–1.2)
Total Protein: 7.8 g/dL (ref 6.5–8.1)

## 2017-04-16 LAB — GLUCOSE, CAPILLARY
Glucose-Capillary: 133 mg/dL — ABNORMAL HIGH (ref 65–99)
Glucose-Capillary: 136 mg/dL — ABNORMAL HIGH (ref 65–99)
Glucose-Capillary: 123 mg/dL — ABNORMAL HIGH (ref 65–99)

## 2017-04-16 LAB — BASIC METABOLIC PANEL
Anion gap: 13 (ref 5–15)
BUN: 12 mg/dL (ref 6–20)
CHLORIDE: 85 mmol/L — AB (ref 101–111)
CO2: 23 mmol/L (ref 22–32)
Calcium: 9.1 mg/dL (ref 8.9–10.3)
Creatinine, Ser: 0.77 mg/dL (ref 0.44–1.00)
GFR calc non Af Amer: >60 mL/min (ref >60)
Glucose, Bld: 140 mg/dL — ABNORMAL HIGH (ref 65–99)
POTASSIUM: 3.7 mmol/L (ref 3.5–5.1)
SODIUM: 121 mmol/L — AB (ref 135–145)

## 2017-04-16 LAB — TRIGLYCERIDES: Triglycerides: 76 mg/dL (ref <150)

## 2017-04-16 LAB — TSH: TSH: 0.926 u[IU]/mL (ref 0.350–4.500)

## 2017-04-16 LAB — TROPONIN I

## 2017-04-16 LAB — BRAIN NATRIURETIC PEPTIDE: B NATRIURETIC PEPTIDE 5: 61 pg/mL (ref 0.0–100.0)

## 2017-04-16 LAB — MAGNESIUM
Magnesium: 1.8 mg/dL (ref 1.7–2.4)
Magnesium: 1.8 mg/dL (ref 1.7–2.4)

## 2017-04-16 LAB — MRSA PCR SCREENING: MRSA by PCR: NEGATIVE

## 2017-04-16 LAB — PHOSPHORUS: Phosphorus: 4.9 mg/dL — ABNORMAL HIGH (ref 2.5–4.6)

## 2017-04-16 MED ORDER — MORPHINE SULFATE (PF) 2 MG/ML IV SOLN
INTRAVENOUS | Status: AC
  Filled 2017-04-16: qty 1

## 2017-04-16 MED ORDER — VECURONIUM BROMIDE 10 MG IV SOLR
10.0000 mg | Freq: Once | INTRAVENOUS | Status: AC
  Administered 2017-04-16: 10 mg via INTRAVENOUS

## 2017-04-16 MED ORDER — FENTANYL CITRATE (PF) 100 MCG/2ML IJ SOLN: 50.0000 ug | Freq: Once | INTRAMUSCULAR | Status: DC

## 2017-04-16 MED ORDER — TIOTROPIUM BROMIDE MONOHYDRATE 18 MCG IN CAPS
1.0000 | ORAL_CAPSULE | Freq: Every day | RESPIRATORY_TRACT | Status: DC
  Administered 2017-04-16: 18 ug via RESPIRATORY_TRACT
  Filled 2017-04-16: qty 5

## 2017-04-16 MED ORDER — BUSPIRONE HCL 10 MG PO TABS
10.0000 mg | ORAL_TABLET | Freq: Every day | ORAL | Status: DC
  Filled 2017-04-16: qty 1

## 2017-04-16 MED ORDER — DULOXETINE HCL 20 MG PO CPEP
40.0000 mg | ORAL_CAPSULE | ORAL | Status: DC
  Administered 2017-04-16 – 2017-04-17 (×2): 40 mg via ORAL
  Filled 2017-04-16 (×2): qty 2

## 2017-04-16 MED ORDER — SODIUM CHLORIDE 0.9 % IV BOLUS (SEPSIS)
250.0000 mL | Freq: Once | INTRAVENOUS | Status: AC
  Administered 2017-04-16: 250 mL via INTRAVENOUS

## 2017-04-16 MED ORDER — DOCUSATE SODIUM 50 MG/5ML PO LIQD
100.0000 mg | Freq: Two times a day (BID) | ORAL | Status: DC | PRN
  Filled 2017-04-16: qty 10

## 2017-04-16 MED ORDER — STERILE WATER FOR INJECTION IJ SOLN
INTRAMUSCULAR | Status: AC
  Administered 2017-04-16: 10 mL
  Filled 2017-04-16: qty 10

## 2017-04-16 MED ORDER — IPRATROPIUM-ALBUTEROL 0.5-2.5 (3) MG/3ML IN SOLN
3.0000 mL | Freq: Once | RESPIRATORY_TRACT | Status: AC
  Administered 2017-04-16: 3 mL via RESPIRATORY_TRACT

## 2017-04-16 MED ORDER — LORAZEPAM 2 MG/ML IJ SOLN: 1.0000 mg | Freq: Once | INTRAMUSCULAR | Status: DC

## 2017-04-16 MED ORDER — ACETAMINOPHEN 325 MG PO TABS
650.0000 mg | ORAL_TABLET | Freq: Four times a day (QID) | ORAL | Status: DC | PRN
  Administered 2017-04-22 – 2017-04-23 (×2): 650 mg via ORAL
  Filled 2017-04-16 (×2): qty 2

## 2017-04-16 MED ORDER — VECURONIUM BROMIDE 10 MG IV SOLR
INTRAVENOUS | Status: AC
  Filled 2017-04-16: qty 10

## 2017-04-16 MED ORDER — DEXTROSE 5 % IV SOLN
500.0000 mg | INTRAVENOUS | Status: DC
  Administered 2017-04-16: 500 mg via INTRAVENOUS
  Filled 2017-04-16 (×2): qty 500

## 2017-04-16 MED ORDER — DEXMEDETOMIDINE HCL IN NACL 400 MCG/100ML IV SOLN
0.4000 ug/kg/h | INTRAVENOUS | Status: DC
  Administered 2017-04-16: 0.5 ug/kg/h via INTRAVENOUS
  Filled 2017-04-16: qty 100

## 2017-04-16 MED ORDER — VECURONIUM BROMIDE 10 MG IV SOLR
INTRAVENOUS | Status: AC
  Administered 2017-04-16: 10 mg via INTRAVENOUS
  Filled 2017-04-16: qty 10

## 2017-04-16 MED ORDER — FAMOTIDINE IN NACL 20-0.9 MG/50ML-% IV SOLN
20.0000 mg | INTRAVENOUS | Status: DC
  Administered 2017-04-16: 20 mg via INTRAVENOUS
  Filled 2017-04-16: qty 50

## 2017-04-16 MED ORDER — MIDAZOLAM HCL 2 MG/2ML IJ SOLN
4.0000 mg | Freq: Once | INTRAMUSCULAR | Status: AC
  Administered 2017-04-16: 4 mg via INTRAVENOUS

## 2017-04-16 MED ORDER — ENOXAPARIN SODIUM 40 MG/0.4ML ~~LOC~~ SOLN: 40.0000 mg | SUBCUTANEOUS | Status: DC

## 2017-04-16 MED ORDER — LISINOPRIL 20 MG PO TABS
20.0000 mg | ORAL_TABLET | Freq: Every day | ORAL | Status: DC
  Administered 2017-04-16: 20 mg via ORAL
  Filled 2017-04-16: qty 1

## 2017-04-16 MED ORDER — AZITHROMYCIN 250 MG PO TABS
250.0000 mg | ORAL_TABLET | Freq: Every day | ORAL | Status: DC
  Administered 2017-04-16: 250 mg via ORAL
  Filled 2017-04-16: qty 1

## 2017-04-16 MED ORDER — ONDANSETRON HCL 4 MG PO TABS: 4.0000 mg | ORAL_TABLET | Freq: Four times a day (QID) | ORAL | Status: DC | PRN

## 2017-04-16 MED ORDER — HYDRALAZINE HCL 20 MG/ML IJ SOLN: 10.0000 mg | Freq: Four times a day (QID) | INTRAMUSCULAR | Status: DC | PRN

## 2017-04-16 MED ORDER — FENTANYL CITRATE (PF) 100 MCG/2ML IJ SOLN
INTRAMUSCULAR | Status: AC
  Filled 2017-04-16: qty 4

## 2017-04-16 MED ORDER — PROPOFOL 1000 MG/100ML IV EMUL
0.0000 ug/kg/min | INTRAVENOUS | Status: DC
Start: 2017-04-16 — End: 2017-04-19
  Administered 2017-04-16: 10 ug/kg/min via INTRAVENOUS
  Administered 2017-04-17: 30 ug/kg/min via INTRAVENOUS
  Administered 2017-04-17 (×3): 40 ug/kg/min via INTRAVENOUS
  Administered 2017-04-18 – 2017-04-19 (×3): 20 ug/kg/min via INTRAVENOUS
  Filled 2017-04-16 (×9): qty 100

## 2017-04-16 MED ORDER — MONTELUKAST SODIUM 10 MG PO TABS
10.0000 mg | ORAL_TABLET | Freq: Every day | ORAL | Status: DC
  Administered 2017-04-16: 10 mg via ORAL
  Filled 2017-04-16: qty 1

## 2017-04-16 MED ORDER — LORAZEPAM 2 MG/ML IJ SOLN
INTRAMUSCULAR | Status: AC
  Filled 2017-04-16: qty 1

## 2017-04-16 MED ORDER — MORPHINE SULFATE (PF) 2 MG/ML IV SOLN
1.0000 mg | INTRAVENOUS | Status: AC
  Administered 2017-04-16: 1 mg via INTRAVENOUS

## 2017-04-16 MED ORDER — MORPHINE SULFATE (PF) 2 MG/ML IV SOLN
1.0000 mg | INTRAVENOUS | Status: DC | PRN
  Administered 2017-04-16: 1 mg via INTRAVENOUS
  Filled 2017-04-16: qty 1

## 2017-04-16 MED ORDER — ROFLUMILAST 500 MCG PO TABS
500.0000 ug | ORAL_TABLET | Freq: Every day | ORAL | Status: DC
  Administered 2017-04-16: 500 ug via ORAL
  Filled 2017-04-16 (×2): qty 1

## 2017-04-16 MED ORDER — VITAL HIGH PROTEIN PO LIQD: 1000.0000 mL | ORAL | Status: DC

## 2017-04-16 MED ORDER — LORAZEPAM 1 MG PO TABS
1.0000 mg | ORAL_TABLET | Freq: Every day | ORAL | Status: DC | PRN
  Filled 2017-04-16: qty 1

## 2017-04-16 MED ORDER — DOCUSATE SODIUM 100 MG PO CAPS
100.0000 mg | ORAL_CAPSULE | Freq: Two times a day (BID) | ORAL | Status: DC
  Administered 2017-04-16: 100 mg via ORAL
  Filled 2017-04-16: qty 1

## 2017-04-16 MED ORDER — MIDAZOLAM HCL 2 MG/2ML IJ SOLN
INTRAMUSCULAR | Status: AC
  Filled 2017-04-16: qty 4

## 2017-04-16 MED ORDER — BUPROPION HCL ER (XL) 150 MG PO TB24
150.0000 mg | ORAL_TABLET | Freq: Two times a day (BID) | ORAL | Status: DC
Start: 2017-04-16 — End: 2017-04-16
  Administered 2017-04-16: 150 mg via ORAL
  Filled 2017-04-16 (×2): qty 1

## 2017-04-16 MED ORDER — ACETAMINOPHEN 650 MG RE SUPP: 650.0000 mg | Freq: Four times a day (QID) | RECTAL | Status: DC | PRN

## 2017-04-16 MED ORDER — SODIUM CHLORIDE 0.9 % IV SOLN
0.0000 ug/min | INTRAVENOUS | Status: DC
  Administered 2017-04-17: 70 ug/min via INTRAVENOUS
  Administered 2017-04-17 (×2): 80 ug/min via INTRAVENOUS
  Administered 2017-04-18: 70 ug/min via INTRAVENOUS
  Filled 2017-04-16 (×2): qty 4
  Filled 2017-04-16 (×2): qty 40

## 2017-04-16 MED ORDER — LORAZEPAM 1 MG PO TABS
1.0000 mg | ORAL_TABLET | Freq: Four times a day (QID) | ORAL | Status: DC | PRN
  Administered 2017-04-16: 1 mg via ORAL

## 2017-04-16 MED ORDER — ALBUTEROL SULFATE (2.5 MG/3ML) 0.083% IN NEBU
2.5000 mg | INHALATION_SOLUTION | RESPIRATORY_TRACT | Status: DC | PRN
  Administered 2017-04-17: 2.5 mg via RESPIRATORY_TRACT
  Filled 2017-04-16: qty 3

## 2017-04-16 MED ORDER — PRO-STAT SUGAR FREE PO LIQD: 30.0000 mL | Freq: Two times a day (BID) | ORAL | Status: DC

## 2017-04-16 MED ORDER — VITAL AF 1.2 CAL PO LIQD
1000.0000 mL | ORAL | Status: DC
  Administered 2017-04-16 – 2017-04-18 (×3): 1000 mL

## 2017-04-16 MED ORDER — BUPROPION HCL ER (SR) 150 MG PO TB12
150.0000 mg | ORAL_TABLET | Freq: Two times a day (BID) | ORAL | Status: DC
  Filled 2017-04-16: qty 1

## 2017-04-16 MED ORDER — SODIUM CHLORIDE 0.9 % IV SOLN
INTRAVENOUS | Status: DC
  Administered 2017-04-16 – 2017-04-17 (×2): via INTRAVENOUS

## 2017-04-16 MED ORDER — FENTANYL 2500MCG IN NS 250ML (10MCG/ML) PREMIX INFUSION
25.0000 ug/h | INTRAVENOUS | Status: DC
  Administered 2017-04-16: 400 ug/h via INTRAVENOUS
  Administered 2017-04-16: 50 ug/h via INTRAVENOUS
  Administered 2017-04-17 – 2017-04-18 (×4): 350 ug/h via INTRAVENOUS
  Filled 2017-04-16 (×7): qty 250

## 2017-04-16 MED ORDER — FENTANYL BOLUS VIA INFUSION
50.0000 ug | INTRAVENOUS | Status: DC | PRN
  Filled 2017-04-16: qty 50

## 2017-04-16 MED ORDER — SODIUM CHLORIDE 0.9 % IV SOLN
0.0000 ug/min | INTRAVENOUS | Status: DC
  Administered 2017-04-16: 50 ug/min via INTRAVENOUS
  Administered 2017-04-16: 10 ug/min via INTRAVENOUS
  Administered 2017-04-16: 90 ug/min via INTRAVENOUS
  Administered 2017-04-16: 100 ug/min via INTRAVENOUS
  Filled 2017-04-16 (×2): qty 1
  Filled 2017-04-16 (×2): qty 10

## 2017-04-16 MED ORDER — BUDESONIDE 0.25 MG/2ML IN SUSP
0.2500 mg | Freq: Two times a day (BID) | RESPIRATORY_TRACT | Status: DC
  Administered 2017-04-16 – 2017-04-17 (×3): 0.25 mg via RESPIRATORY_TRACT
  Filled 2017-04-16 (×3): qty 2

## 2017-04-16 MED ORDER — MIDAZOLAM HCL 2 MG/2ML IJ SOLN: 2.0000 mg | INTRAMUSCULAR | Status: DC | PRN

## 2017-04-16 MED ORDER — IPRATROPIUM-ALBUTEROL 0.5-2.5 (3) MG/3ML IN SOLN
RESPIRATORY_TRACT | Status: AC
  Filled 2017-04-16: qty 9

## 2017-04-16 MED ORDER — ONDANSETRON HCL 4 MG/2ML IJ SOLN: 4.0000 mg | Freq: Four times a day (QID) | INTRAMUSCULAR | Status: DC | PRN

## 2017-04-16 MED ORDER — CLONAZEPAM 1 MG PO TABS
1.0000 mg | ORAL_TABLET | Freq: Two times a day (BID) | ORAL | Status: DC
  Administered 2017-04-16: 1 mg via ORAL
  Filled 2017-04-16 (×2): qty 1

## 2017-04-16 MED ORDER — FENTANYL CITRATE (PF) 100 MCG/2ML IJ SOLN
200.0000 ug | Freq: Once | INTRAMUSCULAR | Status: AC
  Administered 2017-04-16: 200 ug via INTRAVENOUS

## 2017-04-16 MED ORDER — VECURONIUM BROMIDE 10 MG IV SOLR
10.0000 mg | INTRAVENOUS | Status: DC | PRN
  Administered 2017-04-16: 10 mg via INTRAVENOUS
  Filled 2017-04-16: qty 10

## 2017-04-16 MED ORDER — ENOXAPARIN SODIUM 40 MG/0.4ML ~~LOC~~ SOLN
40.0000 mg | SUBCUTANEOUS | Status: DC
  Administered 2017-04-16: 40 mg via SUBCUTANEOUS
  Filled 2017-04-16: qty 0.4

## 2017-04-16 MED ORDER — FAMOTIDINE IN NACL 20-0.9 MG/50ML-% IV SOLN: 20.0000 mg | INTRAVENOUS | Status: DC

## 2017-04-16 MED ORDER — PREDNISONE 20 MG PO TABS
40.0000 mg | ORAL_TABLET | Freq: Every day | ORAL | Status: DC
  Administered 2017-04-16: 40 mg via ORAL
  Filled 2017-04-16: qty 2

## 2017-04-16 MED ORDER — SODIUM CHLORIDE 0.9 % IV SOLN
0.4000 ug/kg/h | INTRAVENOUS | Status: DC
  Filled 2017-04-16: qty 2

## 2017-04-16 MED ORDER — MIDAZOLAM HCL 2 MG/2ML IJ SOLN
2.0000 mg | INTRAMUSCULAR | Status: DC | PRN
  Administered 2017-04-16 (×2): 2 mg via INTRAVENOUS
  Filled 2017-04-16 (×3): qty 2

## 2017-04-16 MED ORDER — HYDROCHLOROTHIAZIDE 25 MG PO TABS: 12.5000 mg | ORAL_TABLET | Freq: Every day | ORAL | Status: DC

## 2017-04-16 MED ORDER — METHYLPREDNISOLONE SODIUM SUCC 40 MG IJ SOLR
40.0000 mg | Freq: Two times a day (BID) | INTRAMUSCULAR | Status: DC
  Administered 2017-04-16: 40 mg via INTRAVENOUS
  Filled 2017-04-16: qty 1

## 2017-04-16 MED ORDER — SODIUM CHLORIDE 0.9 % IV SOLN: INTRAVENOUS | Status: DC

## 2017-04-16 MED ORDER — MOMETASONE FURO-FORMOTEROL FUM 200-5 MCG/ACT IN AERO
2.0000 | INHALATION_SPRAY | Freq: Two times a day (BID) | RESPIRATORY_TRACT | Status: DC
  Administered 2017-04-16: 2 via RESPIRATORY_TRACT
  Filled 2017-04-16: qty 8.8

## 2017-04-16 MED ORDER — IPRATROPIUM-ALBUTEROL 0.5-2.5 (3) MG/3ML IN SOLN
3.0000 mL | RESPIRATORY_TRACT | Status: DC
  Administered 2017-04-16 – 2017-04-18 (×12): 3 mL via RESPIRATORY_TRACT
  Filled 2017-04-16 (×12): qty 3

## 2017-04-16 NOTE — Consult Note (Signed)
Consultation Note Date: 04/16/2017   Patient Name: Alicia Rivera  DOB: [birth year]  MRN: [id number]  Age / Sex: 61 y.o., female  PCP: [facility] [place] Referring Physician: Hillary Bow, MD  Reason for Consultation: Establishing goals of care  HPI/Patient Profile: 61 y.o. female  with past medical history of COPD, depression, and anxiety admitted on 04/15/2017 with shortness of breath x1 week. In ED, patient with severe respiratory distress with hypoxemia and placed on BiPAP. Hypertension requiring nitro gtt. Also c/o chest pain. EKG and troponin negative. Chest xray negative to acute cardiopulmonary process. Reveals emphysema. Patient receiving nebulizers, inhalers, and steroids. Palliative medicine consultation for goals of care.   Clinical Assessment and Goals of Care: I have reviewed medical records and met with patient and son Alicia Rivera) at bedside to discuss diagnosis and GOC. Upon arrival to room, patient c/o anxiety and shortness of breath. "I need something for my nerves." Dyspnea at rest. RN notified to give prn ativan.   Introduced Palliative Medicine as specialized medical care for people living with serious illness. It focuses on providing relief from the symptoms and stress of a serious illness. The goal is to improve quality of life for both the patient and the family.  We discussed a brief life review of the patient. No spouse. Six adult children who are supportive and in the area. Prior to hospitalization, living independently. Prior to SOB x1 week, patient able to perform all ADL's independently and driving. She tells me she has had COPD for 2 years and follows pulmonology at [facility] (no notes found). She tells me she has never had an exacerbation.   Discussed hospital diagnoses and interventions. Educated on chronic, progressive nature of COPD.   Advanced directives and concepts  specific to code status were discussed. Patient tells me she has a living will and documented HCPOA--her son Alicia Rivera. No documentation on file. Encouraged her to have family bring documentation to hospital to copy to chart. Asked patient if she decompensated to the point of requiring intubation/mechanical ventilation, would she want this? Also if her heart stopped, would she want to be resuscitated? Patient tells me YES to all life-prolonging measures.   Patient anxious/irritable throughout the conversation. Short answers. Encouraged her to ask for ativan/morphine as needed for anxiety and SOB. Patient agreeable with fan at bedside, which may provide some relief.     SUMMARY OF RECOMMENDATIONS    FULL code/FULL scope treatment including intubation/mechanical ventilation if she decompensated.   May need pulmonary consult.  Fan to bedside.  Encouraged patient/family bring advanced directives/HCPOA documentation to hospital.   PMT will continue to support patient/family through hospitalization.   Code Status/Advance Care Planning:  Full code  Symptom Management:   Ativan 26m PO q6h prn anxiety  Palliative Prophylaxis:   Aspiration, Delirium Protocol, Frequent Pain Assessment and Oral Care  Additional Recommendations (Limitations, Scope, Preferences):  Full Scope Treatment  Psycho-social/Spiritual:   Desire for further Chaplaincy support: no  Additional Recommendations: Caregiving  Support/Resources  Prognosis:   Unable  to determine  Discharge Planning: To Be Determined      Primary Diagnoses: Present on Admission: . Acute on chronic respiratory failure with hypoxemia ([place])   I have reviewed the medical record, interviewed the patient and family, and examined the patient. The following aspects are pertinent.  Past Medical History:  Diagnosis Date  . COPD (chronic obstructive pulmonary disease) ([place])    Social History   Social History  . Marital status: Single     Spouse name: N/A  . Number of children: N/A  . Years of education: N/A   Social History Main Topics  . Smoking status: Light Tobacco Smoker  . Smokeless tobacco: Never Used  . Alcohol use No  . Drug use: No  . Sexual activity: No   Other Topics Concern  . None   Social History Narrative  . None   Family History  Problem Relation Age of Onset  . Emphysema Brother    Scheduled Meds: . azithromycin  250 mg Oral Daily  . buPROPion  150 mg Oral BID  . busPIRone  10 mg Oral QHS  . clonazePAM  1 mg Oral BID  . docusate sodium  100 mg Oral BID  . DULoxetine  40 mg Oral BH-q7a  . enoxaparin (LOVENOX) injection  40 mg Subcutaneous Q24H  . lisinopril  20 mg Oral Daily  . LORazepam  1 mg Intravenous Once  . mometasone-formoterol  2 puff Inhalation BID  . montelukast  10 mg Oral Daily  . predniSONE  40 mg Oral Daily  . roflumilast  500 mcg Oral Daily  . tiotropium  1 capsule Inhalation Daily   Continuous Infusions: PRN Meds:.acetaminophen **OR** acetaminophen, albuterol, hydrALAZINE, LORazepam, morphine injection, ondansetron **OR** ondansetron (ZOFRAN) IV Medications Prior to Admission:  Prior to Admission medications   Medication Sig Start Date End Date Taking? Authorizing Provider  buPROPion (WELLBUTRIN XL) 150 MG 24 hr tablet Take 1 tablet by mouth 2 (two) times daily. 04/01/17  Yes [provider]  busPIRone (BUSPAR) 10 MG tablet Take 1 tablet by mouth at bedtime. 04/09/17  Yes [provider]  clonazePAM (KLONOPIN) 1 MG tablet Take 1 tablet by mouth 2 (two) times daily. 03/06/17  Yes [provider]  DALIRESP 500 MCG TABS tablet Take 1 tablet by mouth daily. 04/02/17  Yes [provider]  DULoxetine (CYMBALTA) 20 MG capsule Take 2 capsules by mouth every morning. 04/10/17  Yes [provider]  hydrochlorothiazide (HYDRODIURIL) 12.5 MG tablet Take 1 tablet by mouth daily. 02/03/17  Yes [provider]  LORazepam (ATIVAN) 1 MG tablet  Take 1 tablet by mouth daily as needed. 04/13/17  Yes [provider]  montelukast (SINGULAIR) 10 MG tablet Take 1 tablet by mouth daily. 04/09/17  Yes [provider]  predniSONE (DELTASONE) 20 MG tablet Take 2 tablets by mouth daily. 04/12/17  Yes [provider]  PROAIR HFA 108 (90 Base) MCG/ACT inhaler Inhale 1 puff into the lungs every 4 (four) hours as needed. 03/16/17  Yes [provider]  SPIRIVA HANDIHALER 18 MCG inhalation capsule Place 1 capsule into inhaler and inhale daily. 04/02/17  Yes [provider]  SYMBICORT 160-4.5 MCG/ACT inhaler Inhale 1 puff into the lungs 2 (two) times daily. 03/06/17  Yes [provider]  azithromycin (ZITHROMAX) 250 MG tablet Take 1 tablet by mouth daily. 04/12/17   [provider]   Allergies  Allergen Reactions  . Sulfa Antibiotics    Review of Systems  Constitutional: Positive for  activity change, appetite change and fatigue.  Respiratory: Positive for chest tightness, shortness of breath and wheezing.    Physical Exam  Constitutional: She is oriented to person, place, and time. She is cooperative. She appears ill.  HENT:  Head: Normocephalic and atraumatic.  Cardiovascular: Regular rhythm.   Pulmonary/Chest: Accessory muscle usage present. She has wheezes.  Dyspnea at rest/anxiety-RN to give prn ativan  Abdominal: Normal appearance. There is no tenderness.  Neurological: She is alert and oriented to person, place, and time.  Skin: Skin is warm and dry. There is pallor.  Psychiatric: Her speech is normal and behavior is normal. Her mood appears anxious. Cognition and memory are normal.  Nursing note and vitals reviewed.  Vital Signs: BP 136/78 (BP Location: Right Arm)   Pulse 96   Temp 97.6 F (36.4 C) (Oral)   Resp (!) 24   Ht 5' 5" (1.651 m)   Wt 78.8 kg (173 lb 11.6 oz)   SpO2 96%   BMI 28.91 kg/m  Pain Assessment: No/denies pain   Pain Score: 0-No pain  SpO2: SpO2: 96 % O2  Device:SpO2: 96 % O2 Flow Rate: .O2 Flow Rate (L/min): 5 L/min  IO: Intake/output summary: No intake or output data in the 24 hours ending 04/16/17 1012  LBM:   Baseline Weight: Weight: 85.2 kg (187 lb 13.3 oz) Most recent weight: Weight: 78.8 kg (173 lb 11.6 oz)     Palliative Assessment/Data: PPS 50%   Flowsheet Rows     Most Recent Value  Intake Tab  Referral Department  Hospitalist  Unit at Time of Referral  Cardiac/Telemetry Unit  Palliative Care Primary Diagnosis  Pulmonary  Palliative Care Type  New Palliative care  Reason for referral  Clarify Goals of Care  Date first seen by Palliative Care  04/16/17  Clinical Assessment  Palliative Performance Scale Score  50%  Psychosocial & Spiritual Assessment  Palliative Care Outcomes  Patient/Family meeting held?  Yes  Who was at the meeting?  patient and son Alicia Rivera)  Palliative Care Outcomes  Clarified goals of care, Provided psychosocial or spiritual support, Improved non-pain symptom therapy, ACP counseling assistance      Time In: 0935 Time Out: 1025 Time Total: 68mn Greater than 50%  of this time was spent counseling and coordinating care related to the above assessment and plan.  Signed by:  MIhor Dow FNP-C Palliative Medicine Team  Phone: [phone]: [phone]  Please contact Palliative Medicine Team phone at [phone] questions and concerns.  For individual provider: See AShea Evans

## 2017-04-16 NOTE — Progress Notes (Signed)
[facility] - [place] [facility]   PATIENT NAME: Alicia Rivera    MR#:  [id number]  DATE OF BIRTH:  1956/09/10  SUBJECTIVE:  CHIEF COMPLAINT:   Chief Complaint  Patient presents with  . Shortness of Breath   Patient continues to have shortness of breath. Says that she is getting tired. Son at bedside.  REVIEW OF SYSTEMS:    Review of Systems  Constitutional: Positive for malaise/fatigue. Negative for chills and fever.  HENT: Negative for sore throat.   Eyes: Negative for blurred vision, double vision and pain.  Respiratory: Positive for cough and shortness of breath. Negative for hemoptysis and wheezing.   Cardiovascular: Negative for chest pain, palpitations, orthopnea and leg swelling.  Gastrointestinal: Positive for nausea. Negative for abdominal pain, constipation, diarrhea, heartburn and vomiting.  Genitourinary: Negative for dysuria and hematuria.  Musculoskeletal: Negative for back pain and joint pain.  Skin: Negative for rash.  Neurological: Positive for weakness. Negative for sensory change, speech change, focal weakness and headaches.  Endo/Heme/Allergies: Does not bruise/bleed easily.  Psychiatric/Behavioral: Negative for depression. The patient is not nervous/anxious.     DRUG ALLERGIES:   Allergies  Allergen Reactions  . Sulfa Antibiotics     VITALS:  Blood pressure 136/78, pulse 96, temperature 97.6 F (36.4 C), temperature source Oral, resp. rate (!) 24, height 5\' 5"  (1.651 m), weight 78.8 kg (173 lb 11.6 oz), SpO2 96 %.  PHYSICAL EXAMINATION:   Physical Exam  GENERAL:  61 y.o.-year-old patient lying in the bed, With conversational dyspnea. In respiratory distress. EYES: Pupils equal, round, reactive to light and accommodation. No scleral icterus. Extraocular muscles intact.  HEENT: Head atraumatic, normocephalic. Oropharynx and nasopharynx clear.  NECK:  Supple, no jugular venous distention. No thyroid enlargement, no tenderness.   LUNGS: Shallow breathing, tachypneic. Decreased air entry in bilateral wheezing CARDIOVASCULAR: S1, S2 normal. No murmurs, rubs, or gallops.  ABDOMEN: Soft, nontender, nondistended. Bowel sounds present. No organomegaly or mass.  EXTREMITIES: No cyanosis, clubbing or edema b/l.    NEUROLOGIC: Moves all 4 extremities PSYCHIATRIC: The patient is alert and oriented x 3. Anxious SKIN: No obvious rash, lesion, or ulcer.   LABORATORY PANEL:   CBC  Recent Labs Lab 04/16/17 0009  WBC 16.5*  HGB 15.7  HCT 44.9  PLT 389   ------------------------------------------------------------------------------------------------------------------ Chemistries   Recent Labs Lab 04/16/17 0009  NA 123*  K 3.8  CL 86*  CO2 22  GLUCOSE 136*  BUN 7  CREATININE 0.74  CALCIUM 9.5  MG 1.8  AST 36  ALT 21  ALKPHOS 80  BILITOT 0.6   ------------------------------------------------------------------------------------------------------------------  Cardiac Enzymes  Recent Labs Lab 04/16/17 0009  TROPONINI <0.03   ------------------------------------------------------------------------------------------------------------------  RADIOLOGY:  Dg Chest Portable 1 View  Result Date: 04/16/2017 CLINICAL DATA:  61 year old female with shortness of breath. EXAM: PORTABLE CHEST 1 VIEW COMPARISON:  Chest CT dated 04/12/2017 FINDINGS: There is severe emphysema. No focal consolidation, pleural effusion, or pneumothorax. The cardiac silhouette is within normal limits. Osteopenia with degenerative changes of the spine. No acute osseous pathology. IMPRESSION: No acute cardiopulmonary process.  Emphysema. Electronically Signed   By: Elgie Collard M.D.   On: 04/16/2017 00:28     ASSESSMENT AND PLAN:   * Acute COPD exacerbation with acute hypoxic respiratory failure Patient is worsening in spite of appropriate treatment. After getting 1 dose of Ativan and 1 dose of IV morphine patient is still very minute  to easing. Will benefit from BiPAP. Discussed with Dr.  Kasa of pulmonary regarding transfer to stepdown unit. - steroids - Scheduled Nebulizers - Inhalers -Wean O2 as tolerated Her shortness of breath is also being greatly contributed by anxiety.  * Hypertensive emergency this is improved. Stop nitro drip. We will change her hydrochlorothiazide to lisinopril due to hyponatremia.  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 Stop IV fluids. Fluid restriction.  * Leukocytosis likely from steroid use. No infiltrates on chest x-ray.  * DVT prophylaxis with Lovenox  All the records are reviewed and case discussed with Care Management/Social Worker Management plans discussed with the patient, family and they are in agreement.  CODE STATUS: FULL CODE  DVT Prophylaxis: SCDs  TOTAL CC TIME TAKING CARE OF THIS PATIENT: 35 minutes.   Milagros LollSudini, Genae Strine R M.D on 04/16/2017 at 10:55 AM  Between 7am to 6pm - Pager - [phone]  After 6pm go to [email or web address] - password [email or web address]  [facility] [place] [facility]  Office  [phone]  CC: Primary care physician; Jenne PaneLlc, [facility]  Note: This dictation was prepared with Dragon dictation along with smaller phrase technology. Any transcriptional errors that result from this process are unintentional.

## 2017-04-16 NOTE — Progress Notes (Signed)
ET tube pulled back 2 cm and taped at 21 cm at lip mark per Dr.kasa's order

## 2017-04-16 NOTE — Care Management (Signed)
Mm screen due to diagnosis.  Presents from home and prior to this admission, independent in all her adls.  No issues accessing medical care, transportation or paying for medications.  She required bipap in the ED and now that she is on 2A, she is experiencing increase in respiratory difficultly requiring transfer to icu for continuous bipap.  She has failed outpatient attempts to resolve sx.  Her current need for supplemental 02 is acute. Has been evaluated by palliative.

## 2017-04-16 NOTE — ED Notes (Signed)
BIPAP removed from Pt. Pt placed on [place] 6lpm.

## 2017-04-16 NOTE — Procedures (Signed)
Endotracheal Intubation: Patient required placement of an artificial airway secondary to Respiratory Failure  Consent: Emergent.   Hand washing performed prior to starting the procedure.   Medications administered for sedation prior to procedure:  Midazolam 4 mg IV,  Vecuronium 10 mg IV, Fentanyl 100 mcg IV.    A time out procedure was called and correct patient, name, & ID confirmed. Needed supplies and equipment were assembled and checked to include ETT, 10 ml syringe, Glidescope, Mac and Miller blades, suction, oxygen and bag mask valve, end tidal CO2 monitor.   Patient was positioned to align the mouth and pharynx to facilitate visualization of the glottis.   Heart rate, SpO2 and blood pressure was continuously monitored during the procedure. Pre-oxygenation was conducted prior to intubation and endotracheal tube was placed through the vocal cords into the trachea.     The artificial airway was placed under direct visualization via glidescope route using a 8.0 ETT on the first attempt.  ETT was secured at 23 cm mark.  Placement was confirmed by auscuitation of lungs with good breath sounds bilaterally and no stomach sounds.  Condensation was noted on endotracheal tube.   Pulse ox 98%.  CO2 detector in place with appropriate color change.   Complications: None .   Operator: Breck Hollinger.   Chest radiograph ordered and pending.   Comments: OGT placed via glidescope.  Mikhael Hendriks David Dacia Capers, M.D.  [facility]  Medical Director ICU-[facility] [place] Medical Director [facility] Cardio-Pulmonary Department       

## 2017-04-16 NOTE — ED Notes (Signed)
Dr. York CeriseForbach at bedside for evaluation.

## 2017-04-16 NOTE — ED Provider Notes (Signed)
[facility] Department Provider Note  ____________________________________________   First MD Initiated Contact with Patient 04/15/17 2345     (approximate)  I have reviewed the triage vital signs and the nursing notes.   HISTORY  Chief Complaint Shortness of Breath  Level 5 caveat:  history/ROS limited by acute/critical illness  HPI Alicia Rivera is a 61 y.o. female with history of primarily of COPD with no chronic oxygen requirement but also who reports a history of exactly who presents by EMS in severe respiratory distress.  Reportedly she has been having increasingly severe episodes of shortness of breath for the last week.  She has been to [facility] 3 times and each time they have treated her with a nebulizer treatment and reportedly with some Ativan and then discharge her.  Tonight she was short of breath and then became acutely worse.  Nothing made it better and any amount of exertion made it worse.  EMS reports that she looked critically ill when they arrived and they treated her simultaneously for COPD and CHF with BiPAP, Solu-Medrol 125 mg IV, 3 DuoNeb labs, and 1 inch of nitroglycerin paste as her initial blood pressure was over 200 systolic.  She also reportedly had an oxygen saturation in the upper 80s originally.  When she arrived in the emergency department she reportedly looked better than she did it but was still in severe distress and using accessory muscles to breathe on the CPAP.  See hospital course for additional details.  She has some vague chest discomfort but she is only able to speak in short phrases at this time.  She reports that she does not feel better and still feels very short of breath.   Past Medical History:  Diagnosis Date  . COPD (chronic obstructive pulmonary disease) (HCC)     There are no active problems to display for this patient.   History reviewed. No pertinent surgical history.  Prior to  Admission medications   Medication Sig Start Date End Date Taking? Authorizing Provider  buPROPion (WELLBUTRIN XL) 150 MG 24 hr tablet Take 1 tablet by mouth 2 (two) times daily. 04/01/17  Yes [provider]  busPIRone (BUSPAR) 10 MG tablet Take 1 tablet by mouth at bedtime. 04/09/17  Yes [provider]  clonazePAM (KLONOPIN) 1 MG tablet Take 1 tablet by mouth 2 (two) times daily. 03/06/17  Yes [provider]  DALIRESP 500 MCG TABS tablet Take 1 tablet by mouth daily. 04/02/17  Yes [provider]  DULoxetine (CYMBALTA) 20 MG capsule Take 2 capsules by mouth every morning. 04/10/17  Yes [provider]  hydrochlorothiazide (HYDRODIURIL) 12.5 MG tablet Take 1 tablet by mouth daily. 02/03/17  Yes [provider]  LORazepam (ATIVAN) 1 MG tablet Take 1 tablet by mouth daily as needed. 04/13/17  Yes [provider]  montelukast (SINGULAIR) 10 MG tablet Take 1 tablet by mouth daily. 04/09/17  Yes [provider]  predniSONE (DELTASONE) 20 MG tablet Take 2 tablets by mouth daily. 04/12/17  Yes [provider]  PROAIR HFA 108 (90 Base) MCG/ACT inhaler Inhale 1 puff into the lungs every 4 (four) hours as needed. 03/16/17  Yes [provider]  SPIRIVA HANDIHALER 18 MCG inhalation capsule Place 1 capsule into inhaler and inhale daily. 04/02/17  Yes [provider]  SYMBICORT 160-4.5 MCG/ACT inhaler Inhale 1 puff into the lungs 2 (two) times daily. 03/06/17  Yes [provider]  azithromycin (ZITHROMAX) 250 MG tablet  Take 1 tablet by mouth daily. 04/12/17   [provider]    Allergies Sulfa antibiotics  History reviewed. No pertinent family history.  Social History Social History  Substance Use Topics  . Smoking status: Light Tobacco Smoker  . Smokeless tobacco: Never Used  . Alcohol use No    Review of Systems Level 5 caveat:  history/ROS limited by acute/critical  illness ____________________________________________   PHYSICAL EXAM:  VITAL SIGNS: ED Triage Vitals  Enc Vitals Group     BP 04/15/17 2346 (!) 185/132     Pulse Rate 04/15/17 2346 (!) 121     Resp 04/15/17 2346 (!) 98     Temp 04/15/17 2346 98.6 F (37 C)     Temp Source 04/15/17 2346 Oral     SpO2 04/15/17 2346 97 %     Weight 04/15/17 2350 85.2 kg (187 lb 13.3 oz)     Height 04/15/17 2350 1.651 m (5\' 5" )     Head Circumference --      Peak Flow --      Pain Score 04/15/17 2345 8     Pain Loc --      Pain Edu? --      Excl. in GC? --     Constitutional: Alert and oriented But in severe respiratory distress Eyes: Conjunctivae are normal.  Head: Atraumatic. Neck: No stridor.  No meningeal signs.   Cardiovascular: Tachycardia, regular rhythm. Good peripheral circulation. Grossly normal heart sounds. Respiratory: Increased respiratory effort with intercostal retractions and accessory muscle usage.  Coarse breath sounds throughout with expiratory wheezing.  Lung sounds are present bilaterally Gastrointestinal: Soft and nontender. No distention.  Musculoskeletal: No lower extremity tenderness nor edema. No gross deformities of extremities. Neurologic:  Patient cannot participate in neurological exam given her respiratory distress but she is moving all 4 extremities and has no obvious facial droop Skin:  Skin is warm, dry and intact. No rash noted. Psychiatric: Mood and affect are anxious  ____________________________________________   LABS (all labs ordered are listed, but only abnormal results are displayed)  Labs Reviewed  COMPREHENSIVE METABOLIC PANEL - Abnormal; Notable for the following:       Result Value   Sodium 123 (*)    Chloride 86 (*)    Glucose, Bld 136 (*)    All other components within normal limits  CBC WITH DIFFERENTIAL/PLATELET - Abnormal; Notable for the following:    WBC 16.5 (*)    Neutro Abs 12.8 (*)    All other components within normal limits   BLOOD GAS, ARTERIAL - Abnormal; Notable for the following:    pH, Arterial 7.50 (*)    pCO2 arterial 28 (*)    pO2, Arterial 169 (*)    All other components within normal limits  BRAIN NATRIURETIC PEPTIDE  TROPONIN I  PROTIME-INR  MAGNESIUM   ____________________________________________  EKG  ED ECG REPORT I, Elois Averitt, the attending physician, personally viewed and interpreted this ECG.  Date: 04/15/2017 EKG Time: 23:51 Rate: 112 Rhythm: sinus tachycardia QRS Axis: normal Intervals: normal ST/T Wave abnormalities: Non-specific ST segment / T-wave changes, but no evidence of acute ischemia. Narrative Interpretation: unremarkable  ____________________________________________  RADIOLOGY   Dg Chest Portable 1 View  Result Date: 04/16/2017 CLINICAL DATA:  61 year old female with shortness of breath. EXAM: PORTABLE CHEST 1 VIEW COMPARISON:  Chest CT dated 04/12/2017 FINDINGS: There is severe emphysema. No focal consolidation, pleural effusion, or pneumothorax. The cardiac silhouette is within normal limits. Osteopenia with degenerative changes  of the spine. No acute osseous pathology. IMPRESSION: No acute cardiopulmonary process.  Emphysema. Electronically Signed   By: Elgie Collard M.D.   On: 04/16/2017 00:28    ____________________________________________   PROCEDURES  Critical Care performed: Yes, see critical care procedure note(s)   Procedure(s) performed:   .Critical Care Performed by: Loleta Rose Authorized by: Loleta Rose   Critical care provider statement:    Critical care time (minutes):  45   Critical care time was exclusive of:  Separately billable procedures and treating other patients   Critical care was necessary to treat or prevent imminent or life-threatening deterioration of the following conditions:  Respiratory failure   Critical care was time spent personally by me on the following activities:  Development of treatment plan with  patient or surrogate, discussions with consultants, evaluation of patient's response to treatment, examination of patient, obtaining history from patient or surrogate, ordering and performing treatments and interventions, ordering and review of laboratory studies, ordering and review of radiographic studies, pulse oximetry, re-evaluation of patient's condition and review of old charts      ____________________________________________   INITIAL IMPRESSION / ASSESSMENT AND PLAN / ED COURSE  Pertinent labs & imaging results that were available during my care of the patient were reviewed by me and considered in my medical decision making (see chart for details).     Clinical Course as of Apr 16 457  Wed Apr 15, 2017  2355 I saw the patient as she arrived by EMS.  She is in severe respiratory distress and we moved her from CPAP to BiPAP immediately.  Her initial blood pressure was about 185/133 and I believe her presentation is consistent with a mixed picture of flash pulmonary edema and COPD.  I am starting her on a nitroglycerin drip starting at 100 mcg/m and titrating appropriately for his blood pressure and symptoms.  I am ordering a full workup including lab work, portable chest x-ray, EKG which was not indicative of acute ischemia, etc.  I have ordered albuterol to go in-line with the BiPAP since we know that she has COPD although again I feel this current presentation is more consistent with CHF.  Will monitor carefully and hope that with aggressive treatment we will be able to avoid intubation.  [CF]  2357 ABG pending, discussed with respiratory therapist.  [CF]  Thu Apr 16, 2017  0021 Patient had an acute event immediately following ABG.  I suspect there was a mixed picture of vasovagal episode, anxiety due to the BiPAP (she told me after I took off the BiPAP that her anxiety is terrible and the mask makes her feel worse), and the drop in blood pressure achieved through the use of the  nitroglycerin drip.  Her blood pressure was around 100 systolic when I checked and she was staring off and not responding to me initially.  After a couple of seconds when I called her name and administered a noxious stimulus she focused back in on me and was not postictal.  She had no generalized shaking, she just seemed to pass out for a few seconds.  I dropped with a rate of nitroglycerin from 100 mics per minute to 50 mics per minute.  Her blood pressure nearly immediately return back to about 170/110, so we are now slowly titrating up the dose.  Again, in retrospect I think she most likely had a vasovagal episode combined with her blood pressure dropped from the nitroglycerin.  We took off the BiPAP because  she was starting to gag and heave and I wanted to avoid aspiration.  She reports that she actually feels better off of the BiPAP and I have her on 4 L of oxygen by nasal cannula at this point.  She is taking slow deep breaths in through her nose and out through her mouth and her oxygenation is in the upper 90s.  We continue to await the results of blood work and x-ray.  [CF]  0027 Patient has what I suspect is a respiratory alkalosis.  She has been on as a pressure ventilation and she also reports that her anxiety is very bad; she may have been hyperventilating on the BiPAP.  We have taken it off and she is not taking slow deep and easy breaths.  Continue to evaluate.  [CF]  0047 As documented in the nursing notes, the patient received Solu-Medrol 125 mg and 3 duo nebs prior to arrival, so I am not repeating the Solu-Medrol.  Continuing albuterol nebs.  [CF]  0113 Patient is feeling much better at this point.  Her nitroglycerin has been titrated so that it is at about 60 mics per minute at this point and her blood pressure is approximately 110/70.  She is breathing comfortably and slowly, and through her nose and out through her mouth.  I turned on the nasal cannula at 2 L and she is satting about 94%  (with COPD at baseline).  Son and daughter are now present and they confirmed the history that she has been to another hospital multiple times recently.  I assured them that we would keep her tonight and explained to the best my ability that I believe she is experiencing hypertensive emergency exacerbated by anxiety and her chronic COPD.  I assured them that we will continue treating all these symptoms in the hospital.  I discussed the case by phone with Dr. Anne HahnWillis who will admit.  [CF]  0114 No pulmonary edema DG Chest Portable 1 View [CF]    Clinical Course User Index [CF] Loleta RoseForbach, Nahjae Hoeg, MD    ____________________________________________  FINAL CLINICAL IMPRESSION(S) / ED DIAGNOSES  Final diagnoses:  Acute respiratory failure with hypoxemia (HCC)  Hypertensive emergency, no CHF  COPD exacerbation (HCC)     MEDICATIONS GIVEN DURING THIS VISIT:  Medications  nitroGLYCERIN 50 mg in dextrose 5 % 250 mL (0.2 mg/mL) infusion (65 mcg/min Intravenous Rate/Dose Change 04/16/17 0329)  albuterol (PROVENTIL) (2.5 MG/3ML) 0.083% nebulizer solution 2.5 mg (2.5 mg Nebulization Given 04/16/17 0030)  ipratropium-albuterol (DUONEB) 0.5-2.5 (3) MG/3ML nebulizer solution 3 mL (3 mLs Nebulization Given 04/16/17 0419)  ipratropium-albuterol (DUONEB) 0.5-2.5 (3) MG/3ML nebulizer solution 3 mL (3 mLs Nebulization Given 04/16/17 0420)  ipratropium-albuterol (DUONEB) 0.5-2.5 (3) MG/3ML nebulizer solution 3 mL (3 mLs Nebulization Given 04/16/17 0421)     NEW OUTPATIENT MEDICATIONS STARTED DURING THIS VISIT:  New Prescriptions   No medications on file    Modified Medications   No medications on file    Discontinued Medications   No medications on file     Note:  This document was prepared using Dragon voice recognition software and may include unintentional dictation errors.    Loleta RoseForbach, Fanny Agan, MD 04/16/17 [id number]

## 2017-04-16 NOTE — H&P (Signed)
Alicia Rivera is an 61 y.o. female.   Chief Complaint: Shortness of breath HPI: The patient with past medical history of COPD presents to the emergency department complaining of shortness of breath. The patient has had waxing and waning dyspnea over the last 2 weeks. She has been seen at [facility] 3 times during that time spent with the same complaint. They have treated her for COPD exacerbations and ruled out myocardial ischemia, PE and aortic dissection (although notably her thoracic aorta is borderline enlarged). Tonight the patient arrived with severe respiratory distress with hypoxemia which required BiPAP. Her blood pressure was dramatically elevated as well so she was started on a nitroglycerin drip. The patient improved and was actually alkalotic by blood gas by the time BiPAP was discontinued. She was placed on 2 L of oxygen via nasal cannula. However, she continued to complain of difficulty breathing. She also complained of intermittent chest pain during periods of tachypnea but she is quite aware that her anxiety exacerbates her perception of pain. Prior to this encounter the patient had been on Klonopin 3 times a day but has gradually weaned of the encouragement of her primary care doctor. Troponin was negative and EKG showed no indication of myocardial ischemia. Blood pressure improved. Nitroglycerin drip was titrated down and the emergency department staff called the hospitalist service for further management.  Past Medical History:  Diagnosis Date  . COPD (chronic obstructive pulmonary disease) ([place])     Past Surgical History:  Procedure Laterality Date  . none      Family History  Problem Relation Age of Onset  . Emphysema Brother    Social History:  reports that she has been smoking.  She has never used smokeless tobacco. She reports that she does not drink alcohol or use drugs.  Allergies:  Allergies  Allergen Reactions  . Sulfa Antibiotics     Prior to Admission  medications   Medication Sig Start Date End Date Taking? Authorizing Provider  buPROPion (WELLBUTRIN XL) 150 MG 24 hr tablet Take 1 tablet by mouth 2 (two) times daily. 04/01/17  Yes [provider]  busPIRone (BUSPAR) 10 MG tablet Take 1 tablet by mouth at bedtime. 04/09/17  Yes [provider]  clonazePAM (KLONOPIN) 1 MG tablet Take 1 tablet by mouth 2 (two) times daily. 03/06/17  Yes [provider]  DALIRESP 500 MCG TABS tablet Take 1 tablet by mouth daily. 04/02/17  Yes [provider]  DULoxetine (CYMBALTA) 20 MG capsule Take 2 capsules by mouth every morning. 04/10/17  Yes [provider]  hydrochlorothiazide (HYDRODIURIL) 12.5 MG tablet Take 1 tablet by mouth daily. 02/03/17  Yes [provider]  LORazepam (ATIVAN) 1 MG tablet Take 1 tablet by mouth daily as needed. 04/13/17  Yes [provider]  montelukast (SINGULAIR) 10 MG tablet Take 1 tablet by mouth daily. 04/09/17  Yes [provider]  predniSONE (DELTASONE) 20 MG tablet Take 2 tablets by mouth daily. 04/12/17  Yes [provider]  PROAIR HFA 108 (90 Base) MCG/ACT inhaler Inhale 1 puff into the lungs every 4 (four) hours as needed. 03/16/17  Yes [provider]  SPIRIVA HANDIHALER 18 MCG inhalation capsule Place 1 capsule into inhaler and inhale daily. 04/02/17  Yes [provider]  SYMBICORT 160-4.5 MCG/ACT inhaler Inhale 1 puff into the lungs 2 (two) times daily. 03/06/17  Yes [provider]  azithromycin (ZITHROMAX) 250 MG tablet Take 1 tablet by mouth daily. 04/12/17   [provider]     Results for orders placed or performed during the hospital encounter of 04/15/17 (from the past 48 hour(s))  Brain natriuretic peptide     Status: None   Collection Time: 04/16/17 12:09 AM  Result Value Ref Range   B Natriuretic Peptide 61.0 0.0 - 100.0 pg/mL  Troponin I     Status: None   Collection Time: 04/16/17 12:09 AM  Result Value Ref  Range   Troponin I <0.03 <0.03 ng/mL  Comprehensive metabolic panel     Status: Abnormal   Collection Time: 04/16/17 12:09 AM  Result Value Ref Range   Sodium 123 (L) 135 - 145 mmol/L   Potassium 3.8 3.5 - 5.1 mmol/L   Chloride 86 (L) 101 - 111 mmol/L   CO2 22 22 - 32 mmol/L   Glucose, Bld 136 (H) 65 - 99 mg/dL   BUN 7 6 - 20 mg/dL   Creatinine, Ser 0.74 0.44 - 1.00 mg/dL   Calcium 9.5 8.9 - 10.3 mg/dL   Total Protein 7.8 6.5 - 8.1 g/dL   Albumin 4.4 3.5 - 5.0 g/dL   AST 36 15 - 41 U/L   ALT 21 14 - 54 U/L   Alkaline Phosphatase 80 38 - 126 U/L   Total Bilirubin 0.6 0.3 - 1.2 mg/dL   GFR calc non Af Amer >60 >60 mL/min   GFR calc Af Amer >60 >60 mL/min    Comment: (NOTE) The eGFR has been calculated using the CKD EPI equation. This calculation has not been validated in all clinical situations. eGFR's persistently <60 mL/min signify possible Chronic Kidney Disease.    Anion gap 15 5 - 15  CBC with Differential     Status: Abnormal   Collection Time: 04/16/17 12:09 AM  Result Value Ref Range   WBC 16.5 (H) 3.6 - 11.0 K/uL   RBC 4.94 3.80 - 5.20 MIL/uL   Hemoglobin 15.7 12.0 - 16.0 g/dL   HCT 44.9 35.0 - 47.0 %   MCV 90.9 80.0 - 100.0 fL   MCH 31.7 26.0 - 34.0 pg   MCHC 34.8 32.0 - 36.0 g/dL   RDW 13.1 11.5 - 14.5 %   Platelets 389 150 - 440 K/uL   Neutrophils Relative % 77 %   Neutro Abs 12.8 (H) 1.4 - 6.5 K/uL   Lymphocytes Relative 17 %   Lymphs Abs 2.8 1.0 - 3.6 K/uL   Monocytes Relative 5 %   Monocytes Absolute 0.8 0.2 - 0.9 K/uL   Eosinophils Relative 0 %   Eosinophils Absolute 0.0 0 - 0.7 K/uL   Basophils Relative 1 %   Basophils Absolute 0.1 0 - 0.1 K/uL  Protime-INR     Status: None   Collection Time: 04/16/17 12:09 AM  Result Value Ref Range   Prothrombin Time 12.2 11.4 - 15.2 seconds   INR 0.91   Magnesium     Status: None   Collection Time: 04/16/17 12:09 AM  Result Value Ref Range   Magnesium 1.8 1.7 - 2.4 mg/dL  Blood gas, arterial     Status:  Abnormal   Collection Time: 04/16/17 12:09 AM  Result Value Ref Range   FIO2 0.30    Delivery systems BILEVEL POSITIVE AIRWAY PRESSURE    Inspiratory PAP 15    Expiratory PAP 5    pH, Arterial 7.50 (H) 7.350 - 7.450   pCO2 arterial 28 (L) 32.0 - 48.0 mmHg   pO2, Arterial 169 (H) 83.0 - 108.0 mmHg  Bicarbonate 21.8 20.0 - 28.0 mmol/L   Acid-Base Excess 0.0 0.0 - 2.0 mmol/L   O2 Saturation 99.6 %   Patient temperature 37.0    Collection site LEFT RADIAL    Sample type ARTERIAL DRAW    Allens test (pass/fail) PASS PASS   Mechanical Rate 8    Dg Chest Portable 1 View  Result Date: 04/16/2017 CLINICAL DATA:  61 year old female with shortness of breath. EXAM: PORTABLE CHEST 1 VIEW COMPARISON:  Chest CT dated 04/12/2017 FINDINGS: There is severe emphysema. No focal consolidation, pleural effusion, or pneumothorax. The cardiac silhouette is within normal limits. Osteopenia with degenerative changes of the spine. No acute osseous pathology. IMPRESSION: No acute cardiopulmonary process.  Emphysema. Electronically Signed   By: Anner Crete M.D.   On: 04/16/2017 00:28    Review of Systems  Constitutional: Negative for chills and fever.  HENT: Negative for sore throat and tinnitus.   Eyes: Negative for blurred vision and redness.  Respiratory: Positive for shortness of breath. Negative for cough.   Cardiovascular: Negative for chest pain, palpitations, orthopnea and PND.  Gastrointestinal: Negative for abdominal pain, diarrhea, nausea and vomiting.  Genitourinary: Negative for dysuria, frequency and urgency.  Musculoskeletal: Negative for joint pain and myalgias.  Skin: Negative for rash.       No lesions  Neurological: Negative for speech change, focal weakness and weakness.  Endo/Heme/Allergies: Does not bruise/bleed easily.       No temperature intolerance  Psychiatric/Behavioral: Negative for depression and suicidal ideas.    Blood pressure 134/82, pulse 91, temperature 98.6 F  (37 C), temperature source Oral, resp. rate (!) 24, height '5\' 5"'  (1.651 m), weight 85.2 kg (187 lb 13.3 oz), SpO2 95 %. Physical Exam  Vitals reviewed. Constitutional: She is oriented to person, place, and time. She appears well-developed and well-nourished. No distress.  HENT:  Head: Normocephalic and atraumatic.  Mouth/Throat: Oropharynx is clear and moist.  Eyes: Pupils are equal, round, and reactive to light. Conjunctivae and EOM are normal. No scleral icterus.  Neck: Normal range of motion. Neck supple. No JVD present. No tracheal deviation present. No thyromegaly present.  Cardiovascular: Normal rate, regular rhythm and normal heart sounds.  Exam reveals no gallop and no friction rub.   No murmur heard. Respiratory: Effort normal and breath sounds normal.  GI: Soft. Bowel sounds are normal. She exhibits no distension. There is no tenderness.  Genitourinary:  Genitourinary Comments: Deferred  Musculoskeletal: Normal range of motion. She exhibits no edema.  Lymphadenopathy:    She has no cervical adenopathy.  Neurological: She is alert and oriented to person, place, and time. No cranial nerve deficit. She exhibits normal muscle tone.  Skin: Skin is warm and dry. No rash noted. No erythema.  Psychiatric: She has a normal mood and affect. Her behavior is normal. Judgment and thought content normal.     Assessment/Plan This is a 61 year old female admitted for respiratory failure. 1. Respiratory failure: Acute on chronic; with hypoxemia. Improved on BiPAP. The patient's respiratory rate is still elevated in part due to anxiety. Ativan as needed for anxiety. Morphine for air hunger. 2. COPD: The patient has been on steroid therapy. She has not been coughing. Continue inhaled corticosteroid is well as Spiriva. Continue Daliresp. Albuterol as needed. 3. Hyponatremia: Hypovolemic; the patient drinks up to 4 pots of coffee per day. She has not been eating well due to her respiratory distress.  Hydrate with intravenous fluid. Encourage by mouth intake. Recheck sodium in 6 hours.  4. Leukocytosis: Secondary to steroid therapy. The patient may need to be on chronic low-dose prednisone going forward. 5.  Hypertension: Uncontrolled; improved on nitro drip. Continue to titrate nitroglycerin downward. Continue chlorothiazide 6. Depression/anxiety: Continue Wellbutrin and BuSpar. 7. DVT prophylaxis: Lovenox 8. GI prophylaxis: None The patient is a full code. Time spent on admission orders and critical care approximately 45 minutes  Harrie Foreman, MD 04/16/2017, 7:38 AM

## 2017-04-16 NOTE — ED Notes (Signed)
Patient having some occasional PVC's on monitor.

## 2017-04-16 NOTE — ED Notes (Signed)
Pt began to experience seizure like/anxiety like episode.

## 2017-04-16 NOTE — ED Notes (Signed)
Patient states her chest is getting tight again.

## 2017-04-16 NOTE — ED Notes (Signed)
Patient reports that she is having trouble breathing again and her chest is tight. Dr Sheryle Hailiamond notified. See MAR for intervention.

## 2017-04-16 NOTE — Progress Notes (Signed)
O2 sats on 5L [place] 96%.   RR: 24. No improvement in symptoms.  Oxygen returned to 4L 

## 2017-04-16 NOTE — Progress Notes (Signed)
Initial Nutrition Assessment  DOCUMENTATION CODES:   Obesity unspecified  INTERVENTION:   Vital AF 1.2 via OGT at goal rate of 1450ml/hr  Regimen provides 1440kcal/day, 90g/day protein, 973ml free water   Pt likely at low risk for refeeding syndrome   NUTRITION DIAGNOSIS:   Inadequate oral intake related to acute illness (COPD exacerbation ) as evidenced by NPO status. Pt sedated on vent  GOAL:   Provide needs based on ASPEN/SCCM guidelines  MONITOR:   Vent status, Labs, Weight trends, I & O's, TF tolerance  REASON FOR ASSESSMENT:   Consult Enteral/tube feeding initiation and management  ASSESSMENT:   61 yo female with a PMH of Tobacco Abuse, HTN, and COPD.  She presented to [facility] ER 07/11 with c/o worsening shortness of breath over the last 2 weeks.   Pt admitted 7/11 for COPD exacerbation. Required intubation today. Per chart, pt is weight stable. Tube feeds initiated. Pt likely at low refeeding risk.     Patient is currently intubated on ventilator support MV: 6.8 L/min Temp (24hrs), Avg:97.7 F (36.5 C), Min:97 F (36.1 C), Max:98.6 F (37 C)  Propofol: none  MAP- 80-105  Medications reviewed and include: lovenox, fentanyl, solu medrol, azithromycin, pepcid, neo-synephrine, morphine   Labs reviewed: Na 121(L), Cl 85(L), Mg 1.8 wnl  Wbc- 16.5(H) cbgs- 136, 140 x 24 hrs  Unable to complete Nutrition-Focused physical exam at this time.   Diet Order:   NPO  Skin:  Reviewed, no issues  Last BM:  7/11  Height:   Ht Readings from Last 1 Encounters:  04/16/17 5\' 5"  (1.651 m)    Weight:   Wt Readings from Last 1 Encounters:  04/16/17 190 lb 14.7 oz (86.6 kg)    Ideal Body Weight:  56.8 kg  BMI:  Body mass index is 31.77 kg/m.  Estimated Nutritional Needs:   Kcal:  1100-1300kcal/day   Protein:  86-104g/day   Fluid:  >1L/day   EDUCATION NEEDS:   Education needs no appropriate at this time  Betsey Holidayasey Quoc Tome MS, RD, LDN Pager #[phone]  [phone] After Hours Pager: [phone]

## 2017-04-16 NOTE — ED Notes (Signed)
Patient reports that her tightness is still "ok". Per the monitor her respiratory rate is in the upper 30's. When this RN entered the room patient was asleep and was breathing normal and not fast as monitor was reading/

## 2017-04-16 NOTE — Consult Note (Signed)
Name: Alicia Rivera MRN: [id number] DOB: 1956-01-31    ADMISSION DATE:  04/15/2017 CONSULTATION DATE: 04/16/2017  REFERRING MD : Dr. Elpidio Anis  CHIEF COMPLAINT: Shortness of Breath   BRIEF PATIENT DESCRIPTION:  61 yo female admitted 07/11 with acute on chronic hypoxic respiratory failure secondary to AECOPD requiring Bipap  SIGNIFICANT EVENTS  07/11-Pt admitted to the telemetry unit 07/12-Pt transferred to ICU   STUDIES:  None   HISTORY OF PRESENT ILLNESS:   This is a 61 yo female with a PMH of Tobacco Abuse, HTN, and COPD.  She presented to [facility] ER 07/11 with c/o worsening shortness of breath over the last 2 weeks.  She has been seen at [facility] 3 times due to AECOPD.  Due to current symptoms she notified EMS upon their arrival she was given iv solumedrol, 3 duoneb treatments, 1 inch nitroglycerin paste due to systolic bp of 200 and placed on CPAP.  Upon arrival to the ER it was noted she was in severe respiratory distress with use of accessory muscles to breath on CPAP.  Her blood pressure remained elevated requiring a brief period on a nitroglycerin gtt.  She was subsequently admitted to the telemetry unit by hospitalist team for further workup and treatment.  On 07/12 pt transferred to ICU due to worsening respiratory failure requiring Bipap and PCCM consulted.   PAST MEDICAL HISTORY :   has a past medical history of COPD (chronic obstructive pulmonary disease) (HCC); HTN (hypertension); and Tobacco abuse.  has a past surgical history that includes none. Prior to Admission medications   Medication Sig Start Date End Date Taking? Authorizing Provider  buPROPion (WELLBUTRIN XL) 150 MG 24 hr tablet Take 1 tablet by mouth 2 (two) times daily. 04/01/17  Yes [provider]  busPIRone (BUSPAR) 10 MG tablet Take 1 tablet by mouth at bedtime. 04/09/17  Yes [provider]  clonazePAM (KLONOPIN) 1 MG tablet Take 1 tablet by mouth 2 (two) times daily. 03/06/17  Yes [provider]  DALIRESP 500 MCG TABS tablet Take 1 tablet by mouth daily. 04/02/17  Yes [provider]  DULoxetine (CYMBALTA) 20 MG capsule Take 2 capsules by mouth every morning. 04/10/17  Yes [provider]  hydrochlorothiazide (HYDRODIURIL) 12.5 MG tablet Take 1 tablet by mouth daily. 02/03/17  Yes [provider]  LORazepam (ATIVAN) 1 MG tablet Take 1 tablet by mouth daily as needed. 04/13/17  Yes [provider]  montelukast (SINGULAIR) 10 MG tablet Take 1 tablet by mouth daily. 04/09/17  Yes [provider]  predniSONE (DELTASONE) 20 MG tablet Take 2 tablets by mouth daily. 04/12/17  Yes [provider]  PROAIR HFA 108 (90 Base) MCG/ACT inhaler Inhale 1 puff into the lungs every 4 (four) hours as needed. 03/16/17  Yes [provider]  SPIRIVA HANDIHALER 18 MCG inhalation capsule Place 1 capsule into inhaler and inhale daily. 04/02/17  Yes [provider]  SYMBICORT 160-4.5 MCG/ACT inhaler Inhale 1 puff into the lungs 2 (two) times daily. 03/06/17  Yes [provider]  azithromycin (ZITHROMAX) 250 MG tablet Take 1 tablet by mouth daily. 04/12/17   [provider]   Allergies  Allergen Reactions  . Sulfa Antibiotics     FAMILY HISTORY:  family history includes Emphysema in her brother. SOCIAL HISTORY:  reports that she has been smoking.  She has never used smokeless tobacco. She reports that she does not drink alcohol or use drugs.  REVIEW OF SYSTEMS:  Unable to assess pt in respiratory distress   SUBJECTIVE:  Pt states she is short of breath and states "I am getting tired"  VITAL SIGNS: Temp:  [97.6 F (36.4 C)-98.6 F (37 C)] 97.6 F (36.4 C) (07/12 0808) Pulse Rate:  [81-121] 96 (07/12 0808) Resp:  [21-98] 24 (07/12 0859) BP: (97-185)/(51-132) 136/78 (07/12 0808) SpO2:  [90 %-97 %] 96 % (07/12 0859) FiO2 (%):  [30 %] 30 % (07/11 2346) Weight:  [78.8 kg (173 lb 11.6 oz)-85.2 kg (187 lb 13.3 oz)]  78.8 kg (173 lb 11.6 oz) (07/12 16100808)  PHYSICAL EXAMINATION: General: acutely ill appearing Caucasian female in respiratory distress  Neuro: alert and oriented, follows commands  HEENT: JVD present, supple Cardiovascular: s1s2, tachy, no M/R/G  Lungs: inspiratory and expiratory wheezes throughout, labored, use of accessory wheezes throughout  Abdomen: +BS x4, soft, obese, non tender, non distended  Musculoskeletal: normal bulk and tone, no edema  Skin: intact no rashes or lesions    Recent Labs Lab 04/16/17 0009  NA 123*  K 3.8  CL 86*  CO2 22  BUN 7  CREATININE 0.74  GLUCOSE 136*    Recent Labs Lab 04/16/17 0009  HGB 15.7  HCT 44.9  WBC 16.5*  PLT 389   Dg Chest Portable 1 View  Result Date: 04/16/2017 CLINICAL DATA:  61 year old female with shortness of breath. EXAM: PORTABLE CHEST 1 VIEW COMPARISON:  Chest CT dated 04/12/2017 FINDINGS: There is severe emphysema. No focal consolidation, pleural effusion, or pneumothorax. The cardiac silhouette is within normal limits. Osteopenia with degenerative changes of the spine. No acute osseous pathology. IMPRESSION: No acute cardiopulmonary process.  Emphysema. Electronically Signed   By: Elgie CollardArash  Radparvar M.D.   On: 04/16/2017 00:28    ASSESSMENT / PLAN: Acute hypoxic respiratory failure secondary to AECOPD  Severe hyponatremia secondary to hypovolemia  Hypertensive Emergency-resolved  Leukocytosis likely secondary to steroids  Hx: Tobacco Abuse  P: Continuous Bipap for now wean as tolerated Maintain O2 sats 88% to 92% Will start scheduled bronchodilator therapy and nebulized steroids Will add iv solumedrol Prn ABG's and CXR  Continue azithromycin  Precedex gtt for anxiety while on Bipap  Continue prn hydralazine and scheduled lisinopril for hypertension  Prn morphine for air hunger  NS @75  ml/hr  Palliative consulted appreciate input  Will need smoking cessation counseling once stabilized  Trend WBC and monitor  fever curve HIGH RISK FOR INTUBATION   Sonda Rumbleana Crewe Heathman, AGNP  Pulmonary/Critical Care Pager [phone] (please enter 7 digits) PCCM Consult Pager [phone] (please enter 7 digits)

## 2017-04-16 NOTE — ED Notes (Signed)
Patient reported she was having difficulty breathing. This RN got her previous RN to see if this was what she was doing earlier. Patient did have wheezing in bilateral lower lobes. Verbal order by Dr Sheryle Hailiamond for 3 duonebs.

## 2017-04-16 NOTE — ED Notes (Signed)
Pt resting in bed, resp even and unlabored, family at bedside, pt aware of pending admission

## 2017-04-16 NOTE — ED Notes (Signed)
Rapid response called.

## 2017-04-16 NOTE — Procedures (Signed)
Central Venous Catheter Insertion Procedure Note Alicia Rivera [id number] 08/04/1956  Procedure: Insertion of Central Venous Catheter Indications: Assessment of intravascular volume, Drug and/or fluid administration and Frequent blood sampling  Procedure Details Consent: Risks of procedure as well as the alternatives and risks of each were explained to the (patient/caregiver).  Consent for procedure obtained. Time Out: Verified patient identification, verified procedure, site/side was marked, verified correct patient position, special equipment/implants available, medications/allergies/relevent history reviewed, required imaging and test results available.  Performed  Maximum sterile technique was used including antiseptics, cap, gloves, gown, hand hygiene, mask and sheet. Skin prep: Chlorhexidine; local anesthetic administered A antimicrobial bonded/coated triple lumen catheter was placed in the left internal jugular vein using the Seldinger technique.  Evaluation Blood flow good Complications: No apparent complications Patient did tolerate procedure well. Chest X-ray ordered to verify placement.  CXR: pending.  Left internal jugular central line placed by Acute Care Nurse Practitioner Student Alicia Rivera under direct supervision utilizing ultrasound no complications noted during or following procedure.  Alicia Rivera, AGNP  Pulmonary/Critical Care Pager [phone] (please enter 7 digits) PCCM Consult Pager [phone] (please enter 7 digits)

## 2017-04-16 NOTE — ED Notes (Signed)
Patient feels much better after breathing treatment. Head of bed lowered per patient request. VSS. Patient on 4L [place].

## 2017-04-16 NOTE — Progress Notes (Signed)
Talked with Dr. Sheryle Hailiamond regarding concerns voiced by staff with Nitro gtt. being decreased at the same time that morphine was given. Concerns voiced about having to move the pt. to the ICU once the morphine wears off. Dr. Sheryle Hailiamond shared that he believes most of her pain is related to breathing/anxiety.

## 2017-04-16 NOTE — ED Notes (Addendum)
Patient reports her breathing is better and her chest tightness is better as well. Patient looks more comfortable.

## 2017-04-16 NOTE — Progress Notes (Signed)
Patient unable to tolerate bipap, Alicia Harmanana, NP assessed patient and patient was intubated. Central line was placed. Family updated on plan of care. Foley placed per order. Patient still asynchronous with ventilator, propofol added per verbal from Alicia Harmanana, NP. [name]

## 2017-04-16 NOTE — Progress Notes (Signed)
Admitted from the ED with resp distress.  Resp rate on arrival to unit was 32.  O2 sat 94%.   O2 via [place] 4L.  O2 increased to 5L to see if her RR / SOB would improve.

## 2017-04-17 DIAGNOSIS — J9622 Acute and chronic respiratory failure with hypercapnia: Secondary | ICD-10-CM

## 2017-04-17 DIAGNOSIS — J9621 Acute and chronic respiratory failure with hypoxia: Secondary | ICD-10-CM

## 2017-04-17 LAB — BLOOD GAS, ARTERIAL
Acid-base deficit: 2.1 mmol/L — ABNORMAL HIGH (ref 0.0–2.0)
Bicarbonate: 25.4 mmol/L (ref 20.0–28.0)
FIO2: 0.4
LHR: 15 {breaths}/min
O2 Saturation: 94.8 %
PEEP/CPAP: 5 cmH2O
Patient temperature: 37
Pressure control: 30 cmH2O
pCO2 arterial: 54 mmHg — ABNORMAL HIGH (ref 32.0–48.0)
pH, Arterial: 7.28 — ABNORMAL LOW (ref 7.350–7.450)
pO2, Arterial: 84 mmHg (ref 83.0–108.0)

## 2017-04-17 LAB — CBC WITH DIFFERENTIAL/PLATELET
BASOS PCT: 0 %
Basophils Absolute: 0.1 10*3/uL (ref 0–0.1)
EOS ABS: 0 10*3/uL (ref 0–0.7)
Eosinophils Relative: 0 %
HEMATOCRIT: 36.4 % (ref 35.0–47.0)
Hemoglobin: 12.5 g/dL (ref 12.0–16.0)
LYMPHS ABS: 1.5 10*3/uL (ref 1.0–3.6)
Lymphocytes Relative: 5 %
MCH: 31.7 pg (ref 26.0–34.0)
MCHC: 34.3 g/dL (ref 32.0–36.0)
MCV: 92.4 fL (ref 80.0–100.0)
Monocytes Absolute: 1.9 10*3/uL — ABNORMAL HIGH (ref 0.2–0.9)
Monocytes Relative: 6 %
NEUTROS ABS: 28.7 10*3/uL — AB (ref 1.4–6.5)
NEUTROS PCT: 89 %
Platelets: 350 10*3/uL (ref 150–440)
RBC: 3.94 MIL/uL (ref 3.80–5.20)
RDW: 13.3 % (ref 11.5–14.5)
WBC: 32.2 10*3/uL — AB (ref 3.6–11.0)

## 2017-04-17 LAB — BASIC METABOLIC PANEL
Anion gap: 7 (ref 5–15)
BUN: 20 mg/dL (ref 6–20)
CHLORIDE: 92 mmol/L — AB (ref 101–111)
CO2: 25 mmol/L (ref 22–32)
Calcium: 8.1 mg/dL — ABNORMAL LOW (ref 8.9–10.3)
Creatinine, Ser: 1.3 mg/dL — ABNORMAL HIGH (ref 0.44–1.00)
GFR calc non Af Amer: 43 mL/min — ABNORMAL LOW (ref >60)
GFR, EST AFRICAN AMERICAN: 50 mL/min — AB (ref >60)
Glucose, Bld: 136 mg/dL — ABNORMAL HIGH (ref 65–99)
POTASSIUM: 4.4 mmol/L (ref 3.5–5.1)
SODIUM: 124 mmol/L — AB (ref 135–145)

## 2017-04-17 LAB — HIV ANTIBODY (ROUTINE TESTING W REFLEX): HIV Screen 4th Generation wRfx: NONREACTIVE

## 2017-04-17 LAB — GLUCOSE, CAPILLARY
Glucose-Capillary: 104 mg/dL — ABNORMAL HIGH (ref 65–99)
Glucose-Capillary: 105 mg/dL — ABNORMAL HIGH (ref 65–99)
Glucose-Capillary: 121 mg/dL — ABNORMAL HIGH (ref 65–99)
Glucose-Capillary: 128 mg/dL — ABNORMAL HIGH (ref 65–99)
Glucose-Capillary: 130 mg/dL — ABNORMAL HIGH (ref 65–99)
Glucose-Capillary: 134 mg/dL — ABNORMAL HIGH (ref 65–99)
Glucose-Capillary: 145 mg/dL — ABNORMAL HIGH (ref 65–99)

## 2017-04-17 LAB — MAGNESIUM: MAGNESIUM: 2 mg/dL (ref 1.7–2.4)

## 2017-04-17 LAB — PHOSPHORUS: PHOSPHORUS: 5.4 mg/dL — AB (ref 2.5–4.6)

## 2017-04-17 MED ORDER — ORAL CARE MOUTH RINSE
15.0000 mL | OROMUCOSAL | Status: DC
  Administered 2017-04-18 – 2017-04-22 (×42): 15 mL via OROMUCOSAL

## 2017-04-17 MED ORDER — CHLORHEXIDINE GLUCONATE 0.12% ORAL RINSE (MEDLINE KIT)
15.0000 mL | Freq: Two times a day (BID) | OROMUCOSAL | Status: DC
  Administered 2017-04-17 – 2017-04-21 (×8): 15 mL via OROMUCOSAL

## 2017-04-17 MED ORDER — VECURONIUM BROMIDE 10 MG IV SOLR
10.0000 mg | INTRAVENOUS | Status: DC | PRN
  Administered 2017-04-17 (×2): 10 mg via INTRAVENOUS
  Filled 2017-04-17 (×2): qty 10

## 2017-04-17 MED ORDER — METHYLPREDNISOLONE SODIUM SUCC 125 MG IJ SOLR
80.0000 mg | Freq: Three times a day (TID) | INTRAMUSCULAR | Status: DC
  Administered 2017-04-17 – 2017-04-18 (×3): 80 mg via INTRAVENOUS
  Filled 2017-04-17 (×3): qty 2

## 2017-04-17 MED ORDER — SODIUM CHLORIDE 0.9 % IV SOLN
INTRAVENOUS | Status: DC
  Administered 2017-04-17: 19:00:00 via INTRAVENOUS

## 2017-04-17 MED ORDER — BISACODYL 10 MG RE SUPP
10.0000 mg | Freq: Every day | RECTAL | Status: DC | PRN
  Administered 2017-04-19 – 2017-04-20 (×2): 10 mg via RECTAL
  Filled 2017-04-17 (×9): qty 1

## 2017-04-17 MED ORDER — SENNOSIDES-DOCUSATE SODIUM 8.6-50 MG PO TABS
1.0000 | ORAL_TABLET | Freq: Two times a day (BID) | ORAL | Status: DC
  Administered 2017-04-17 – 2017-04-19 (×4): 1
  Filled 2017-04-17 (×4): qty 1

## 2017-04-17 MED ORDER — BUDESONIDE 0.25 MG/2ML IN SUSP
0.2500 mg | Freq: Four times a day (QID) | RESPIRATORY_TRACT | Status: DC
  Administered 2017-04-17 – 2017-04-22 (×20): 0.25 mg via RESPIRATORY_TRACT
  Filled 2017-04-17 (×20): qty 2

## 2017-04-17 MED ORDER — FAMOTIDINE 20 MG PO TABS
20.0000 mg | ORAL_TABLET | Freq: Every day | ORAL | Status: DC
  Administered 2017-04-17 – 2017-04-19 (×2): 20 mg
  Filled 2017-04-17 (×2): qty 1

## 2017-04-17 MED ORDER — HEPARIN SODIUM (PORCINE) 5000 UNIT/ML IJ SOLN
5000.0000 [IU] | Freq: Three times a day (TID) | INTRAMUSCULAR | Status: DC
  Administered 2017-04-17 – 2017-04-23 (×18): 5000 [IU] via SUBCUTANEOUS
  Filled 2017-04-17 (×17): qty 1

## 2017-04-17 NOTE — Progress Notes (Signed)
Patient's UOP has decreased during shift. Bladder scan preformed, >200 mL shown. Foley irrigated with no return. Alicia Harmanana, NP notified. IVF started. [name]

## 2017-04-17 NOTE — Progress Notes (Signed)
Name: Alicia Rivera MRN: [id number] DOB: [birth year]    ADMISSION DATE:  04/15/2017 CONSULTATION DATE: 04/16/2017  REFERRING MD : Dr. Elpidio Anis  CHIEF COMPLAINT: Shortness of Breath   BRIEF PATIENT DESCRIPTION:  61 yo female admitted 07/11 with acute on chronic hypoxic respiratory failure secondary to AECOPD requiring Bipap.  Failed BiPAP and was intubated on 7/12  SIGNIFICANT EVENTS  07/11-Pt admitted to the telemetry unit 07/12-Pt transferred to ICU  7/12 Intubated for severe respiratory distress  STUDIES:  None   HISTORY OF PRESENT ILLNESS:   This is a 61 yo female with a PMH of Tobacco Abuse, HTN, and COPD.  She presented to [facility] ER 07/11 with c/o worsening shortness of breath over the last 2 weeks.  She has been seen at [facility] 3 times due to AECOPD.  Due to current symptoms she notified EMS upon their arrival she was given iv solumedrol, 3 duoneb treatments, 1 inch nitroglycerin paste due to systolic bp of 200 and placed on CPAP.  Upon arrival to the ER it was noted she was in severe respiratory distress with use of accessory muscles to breath on CPAP.  Her blood pressure remained elevated requiring a brief period on a nitroglycerin gtt.  She was subsequently admitted to the telemetry unit by hospitalist team for further workup and treatment.  On 07/12 pt transferred to ICU due to worsening respiratory failure requiring Bipap and PCCM consulted.   PAST MEDICAL HISTORY :   has a past medical history of COPD (chronic obstructive pulmonary disease) (HCC); HTN (hypertension); and Tobacco abuse.  has a past surgical history that includes none. Prior to Admission medications   Medication Sig Start Date End Date Taking? Authorizing Provider  buPROPion (WELLBUTRIN XL) 150 MG 24 hr tablet Take 1 tablet by mouth 2 (two) times daily. 04/01/17  Yes [provider]  busPIRone (BUSPAR) 10 MG tablet Take 1 tablet by mouth at bedtime. 04/09/17  Yes [provider]  clonazePAM  (KLONOPIN) 1 MG tablet Take 1 tablet by mouth 2 (two) times daily. 03/06/17  Yes [provider]  DALIRESP 500 MCG TABS tablet Take 1 tablet by mouth daily. 04/02/17  Yes [provider]  DULoxetine (CYMBALTA) 20 MG capsule Take 2 capsules by mouth every morning. 04/10/17  Yes [provider]  hydrochlorothiazide (HYDRODIURIL) 12.5 MG tablet Take 1 tablet by mouth daily. 02/03/17  Yes [provider]  LORazepam (ATIVAN) 1 MG tablet Take 1 tablet by mouth daily as needed. 04/13/17  Yes [provider]  montelukast (SINGULAIR) 10 MG tablet Take 1 tablet by mouth daily. 04/09/17  Yes [provider]  predniSONE (DELTASONE) 20 MG tablet Take 2 tablets by mouth daily. 04/12/17  Yes [provider]  PROAIR HFA 108 (90 Base) MCG/ACT inhaler Inhale 1 puff into the lungs every 4 (four) hours as needed. 03/16/17  Yes [provider]  SPIRIVA HANDIHALER 18 MCG inhalation capsule Place 1 capsule into inhaler and inhale daily. 04/02/17  Yes [provider]  SYMBICORT 160-4.5 MCG/ACT inhaler Inhale 1 puff into the lungs 2 (two) times daily. 03/06/17  Yes [provider]  azithromycin (ZITHROMAX) 250 MG tablet Take 1 tablet by mouth daily. 04/12/17   [provider]   Allergies  Allergen Reactions  . Sulfa Antibiotics     FAMILY HISTORY:  family history includes Emphysema in her brother. SOCIAL HISTORY:  reports that she has been smoking.  She has never used smokeless tobacco. She reports that  she does not drink alcohol or use drugs.  REVIEW OF SYSTEMS:   Unable to obtain as the patient is intubated and on mechanical ventilation  SUBJECTIVE:  Unable to obtain as the patient is intubated and on mechanical ventilation  VITAL SIGNS: Temp:  [97 F (36.1 C)-98.8 F (37.1 C)] 98.3 F (36.8 C) (07/13 0000) Pulse Rate:  [55-107] 57 (07/13 0300) Resp:  [9-34] 14 (07/13 0300) BP: (78-168)/(48-106) 98/50 (07/13 0300) SpO2:  [64  %-100 %] 98 % (07/13 0410) FiO2 (%):  [30 %-40 %] 40 % (07/13 0410) Weight:  [173 lb 11.6 oz (78.8 kg)-190 lb 14.7 oz (86.6 kg)] 190 lb 14.7 oz (86.6 kg) (07/12 1232)  PHYSICAL EXAMINATION: General: acutely ill appearing Caucasian female, intubated and on mechanical ventilation Neuro: sedated HEENT: AT,[place],No JVD Cardiovascular:S1S2, no m/r/g Lungs: coarse,wheezes throughout Abdomen: +BS x4, soft, obese, non tender, non distended  Musculoskeletal: no edema,cyanosis noted Skin: warm,dry and intact   Recent Labs Lab 04/16/17 0009 04/16/17 1321  NA 123* 121*  K 3.8 3.7  CL 86* 85*  CO2 22 23  BUN 7 12  CREATININE 0.74 0.77  GLUCOSE 136* 140*    Recent Labs Lab 04/16/17 0009  HGB 15.7  HCT 44.9  WBC 16.5*  PLT 389   Dg Abd 1 View  Result Date: 04/16/2017 CLINICAL DATA:  Evaluate NG tube placement EXAM: ABDOMEN - 1 VIEW COMPARISON:  None. FINDINGS: The distal tip of the NG tube is within the antrum of the stomach. IMPRESSION: NG tube placement as above. Electronically Signed   By: Gerome Sam III M.D   On: 04/16/2017 17:09   Dg Chest Port 1 View  Result Date: 04/16/2017 CLINICAL DATA:  Evaluate NG and ET tube placements EXAM: PORTABLE CHEST 1 VIEW COMPARISON:  Chest x-ray April 16, 2017 FINDINGS: The ETT terminates in the origin of the right mainstem bronchus. Recommend withdrawing 3 cm. The NG tube appears to terminate below the left hemidiaphragm was poorly visualized. A new left central line terminates in the central SVC. No pneumothorax. No pulmonary nodules or masses. Mild bibasilar atelectasis. No overt edema. The cardiomediastinal silhouette is stable. IMPRESSION: 1. The distal tip of the ET tube is within the mainstem bronchus on the right. Recommend withdrawing 3 cm. 2. Other support apparatus as above.  No pneumothorax. 3. Mild bibasilar atelectasis. Findings called to the patient's nurse, Gearldine Bienenstock. Electronically Signed   By: Gerome Sam III M.D   On: 04/16/2017  17:08   Dg Chest Portable 1 View  Result Date: 04/16/2017 CLINICAL DATA:  61 year old female with shortness of breath. EXAM: PORTABLE CHEST 1 VIEW COMPARISON:  Chest CT dated 04/12/2017 FINDINGS: There is severe emphysema. No focal consolidation, pleural effusion, or pneumothorax. The cardiac silhouette is within normal limits. Osteopenia with degenerative changes of the spine. No acute osseous pathology. IMPRESSION: No acute cardiopulmonary process.  Emphysema. Electronically Signed   By: Elgie Collard M.D.   On: 04/16/2017 00:28    ASSESSMENT / PLAN: Acute hypoxic respiratory failure secondary to AECOPD  Hypovolemic Hyponatremia Leukocytosis likely secondary to steroids  Hx: Tobacco Abuse   PLAN - Vent settings established,Changed to PS 40/5/18/ -VAP bundle implemented - Bronchodilators -Fentanyl/Propofol for sedation -Continue Steroids -Continue Azithromycin -  Added veccuronium if patient is dysynchrons with the ventilator - neosynephrine gtt to maintain MAP goal>65 - Palliative care consulted  Bincy Varughese,AG-ACNP Pulmonary & Critical Care   PCCM ATTENDING ATTESTATION:  I have evaluated patient with the APP Varughese, reviewed database in  its entirety and discussed care plan in detail. In addition, this patient was discussed on multidisciplinary rounds.   Important exam findings: Sedated, intubated, synchronous Prolonged expiratory wheezes Reg, no M NABS Ext warm, no edema  UJW:[id number]:Hyperinflated, NACPD  Major problems addressed by PCCM team: VDRF COPD ex Severe bronchospasm Hyponatremia   PLAN/REC: Cont full vent support - settings reviewed and/or adjusted Cont vent bundle Daily SBT if/when meets criteria Systemic steroids - dose adjusted Cont nebulized steroids and bronchodilators Empiric antibiotics Family updated @ bedside   CCM X 35 mins  Billy Fischeravid Helyne Genther, MD PCCM service Mobile [phone] Pager [phone] 04/17/2017 3:25 PM

## 2017-04-17 NOTE — Progress Notes (Signed)
Patient has warm/redness to chest, Alicia Harmanana, NP notified. Orders to just continue to assess at this time. [name]

## 2017-04-17 NOTE — Progress Notes (Signed)
[facility] - [place] at [facility]   PATIENT NAME: Alicia Rivera    MR#:  [id number]  DATE OF BIRTH:  01/30/1956  SUBJECTIVE:  CHIEF COMPLAINT:   Chief Complaint  Patient presents with  . Shortness of Breath   Patient transferred to ICU yestrday and had to be intubated after Bipap. Today she is on full vent support.  REVIEW OF SYSTEMS:    Review of Systems  Constitutional: Positive for malaise/fatigue. Negative for chills and fever.  HENT: Negative for sore throat.   Eyes: Negative for blurred vision, double vision and pain.  Respiratory: Positive for cough and shortness of breath. Negative for hemoptysis and wheezing.   Cardiovascular: Negative for chest pain, palpitations, orthopnea and leg swelling.  Gastrointestinal: Positive for nausea. Negative for abdominal pain, constipation, diarrhea, heartburn and vomiting.  Genitourinary: Negative for dysuria and hematuria.  Musculoskeletal: Negative for back pain and joint pain.  Skin: Negative for rash.  Neurological: Positive for weakness. Negative for sensory change, speech change, focal weakness and headaches.  Endo/Heme/Allergies: Does not bruise/bleed easily.  Psychiatric/Behavioral: Negative for depression. The patient is not nervous/anxious.     DRUG ALLERGIES:   Allergies  Allergen Reactions  . Sulfa Antibiotics     VITALS:  Blood pressure (!) 98/50, pulse (!) 57, temperature 97.6 F (36.4 C), temperature source Oral, resp. rate 14, height 5\' 5"  (1.651 m), weight 83.8 kg (184 lb 11.9 oz), SpO2 94 %.  PHYSICAL EXAMINATION:   Physical Exam  GENERAL:  61 y.o.-year-old patient lying in the bed, intubated. sedated EYES: Pupils equal, round, reactive to light and accommodation. No scleral icterus. Extraocular muscles intact.  HEENT: Head atraumatic, normocephalic. Oropharynx and nasopharynx clear.  NECK:  Supple, no jugular venous distention. No thyroid enlargement, no tenderness.  LUNGS:  Decreased  air entry in bilateral wheezing CARDIOVASCULAR: S1, S2 normal. No murmurs, rubs, or gallops.  ABDOMEN: Soft,  nondistended. Bowel sounds present. Marland Kitchen.  EXTREMITIES: No cyanosis, clubbing or edema b/l.    PSYCHIATRIC: The patient is sedated SKIN: No obvious rash, lesion, or ulcer.   LABORATORY PANEL:   CBC  Recent Labs Lab 04/17/17 0447  WBC 32.2*  HGB 12.5  HCT 36.4  PLT 350   ------------------------------------------------------------------------------------------------------------------ Chemistries   Recent Labs Lab 04/16/17 0009  04/17/17 0447  NA 123*  < > 124*  K 3.8  < > 4.4  CL 86*  < > 92*  CO2 22  < > 25  GLUCOSE 136*  < > 136*  BUN 7  < > 20  CREATININE 0.74  < > 1.30*  CALCIUM 9.5  < > 8.1*  MG 1.8  < > 2.0  AST 36  --   --   ALT 21  --   --   ALKPHOS 80  --   --   BILITOT 0.6  --   --   < > = values in this interval not displayed. ------------------------------------------------------------------------------------------------------------------  Cardiac Enzymes  Recent Labs Lab 04/16/17 0009  TROPONINI <0.03   ------------------------------------------------------------------------------------------------------------------  RADIOLOGY:  Dg Abd 1 View  Result Date: 04/16/2017 CLINICAL DATA:  Evaluate NG tube placement EXAM: ABDOMEN - 1 VIEW COMPARISON:  None. FINDINGS: The distal tip of the NG tube is within the antrum of the stomach. IMPRESSION: NG tube placement as above. Electronically Signed   By: Gerome Samavid  Williams III M.D   On: 04/16/2017 17:09   Dg Chest Port 1 View  Result Date: 04/16/2017 CLINICAL DATA:  Evaluate NG and ET  tube placements EXAM: PORTABLE CHEST 1 VIEW COMPARISON:  Chest x-ray April 16, 2017 FINDINGS: The ETT terminates in the origin of the right mainstem bronchus. Recommend withdrawing 3 cm. The NG tube appears to terminate below the left hemidiaphragm was poorly visualized. A new left central line terminates in the central SVC. No  pneumothorax. No pulmonary nodules or masses. Mild bibasilar atelectasis. No overt edema. The cardiomediastinal silhouette is stable. IMPRESSION: 1. The distal tip of the ET tube is within the mainstem bronchus on the right. Recommend withdrawing 3 cm. 2. Other support apparatus as above.  No pneumothorax. 3. Mild bibasilar atelectasis. Findings called to the patient's nurse, Gearldine Bienenstock. Electronically Signed   By: Gerome Sam III M.D   On: 04/16/2017 17:08   Dg Chest Portable 1 View  Result Date: 04/16/2017 CLINICAL DATA:  61 year old female with shortness of breath. EXAM: PORTABLE CHEST 1 VIEW COMPARISON:  Chest CT dated 04/12/2017 FINDINGS: There is severe emphysema. No focal consolidation, pleural effusion, or pneumothorax. The cardiac silhouette is within normal limits. Osteopenia with degenerative changes of the spine. No acute osseous pathology. IMPRESSION: No acute cardiopulmonary process.  Emphysema. Electronically Signed   By: Elgie Collard M.D.   On: 04/16/2017 00:28     ASSESSMENT AND PLAN:   * Acute COPD exacerbation with acute hypoxic respiratory failure Steroids, nebs. Intubated. Continue full vent support. Discussed with Dr. Sung Amabile  * Hypertensive emergency this is improved. Stop nitro drip. Changed her hydrochlorothiazide to lisinopril due to hyponatremia.  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 Stopped IV fluids. Fluid restriction.  * Leukocytosis likely from steroid use. No infiltrates on chest x-ray.  * DVT prophylaxis with Lovenox  All the records are reviewed and case discussed with Care Management/Social Worker Management plans discussed with the patient, family and they are in agreement.  CODE STATUS: FULL CODE  DVT Prophylaxis: SCDs  TOTAL TIME TAKING CARE OF THIS PATIENT: 25 minutes.   Milagros Loll R M.D on 04/17/2017 at 11:52 AM  Between 7am to 6pm - Pager - [phone]  After 6pm go to [email or web address] - password [email or web address]  [facility]  [facility] [place] [facility]  Office  [phone]  CC: Primary care physician; [facility]  Note: This dictation was prepared with Dragon dictation along with smaller phrase technology. Any transcriptional errors that result from this process are unintentional.

## 2017-04-18 ENCOUNTER — Inpatient Hospital Stay: Payer: Medicare Other

## 2017-04-18 DIAGNOSIS — F17200 Nicotine dependence, unspecified, uncomplicated: Secondary | ICD-10-CM

## 2017-04-18 DIAGNOSIS — J9602 Acute respiratory failure with hypercapnia: Secondary | ICD-10-CM

## 2017-04-18 DIAGNOSIS — N179 Acute kidney failure, unspecified: Secondary | ICD-10-CM

## 2017-04-18 LAB — PHOSPHORUS: PHOSPHORUS: 11.4 mg/dL — AB (ref 2.5–4.6)

## 2017-04-18 LAB — BASIC METABOLIC PANEL
ANION GAP: 14 (ref 5–15)
BUN: 45 mg/dL — ABNORMAL HIGH (ref 6–20)
CO2: 22 mmol/L (ref 22–32)
Calcium: 7.8 mg/dL — ABNORMAL LOW (ref 8.9–10.3)
Chloride: 92 mmol/L — ABNORMAL LOW (ref 101–111)
Creatinine, Ser: 2.07 mg/dL — ABNORMAL HIGH (ref 0.44–1.00)
GFR calc Af Amer: 28 mL/min — ABNORMAL LOW (ref >60)
GFR calc non Af Amer: 24 mL/min — ABNORMAL LOW (ref >60)
GLUCOSE: 133 mg/dL — AB (ref 65–99)
POTASSIUM: 5.3 mmol/L — AB (ref 3.5–5.1)
Sodium: 128 mmol/L — ABNORMAL LOW (ref 135–145)

## 2017-04-18 LAB — CBC
HEMATOCRIT: 37.7 % (ref 35.0–47.0)
HEMOGLOBIN: 12.6 g/dL (ref 12.0–16.0)
MCH: 31.5 pg (ref 26.0–34.0)
MCHC: 33.4 g/dL (ref 32.0–36.0)
MCV: 94.4 fL (ref 80.0–100.0)
Platelets: 339 10*3/uL (ref 150–440)
RBC: 3.99 MIL/uL (ref 3.80–5.20)
RDW: 13.6 % (ref 11.5–14.5)
WBC: 25 10*3/uL — ABNORMAL HIGH (ref 3.6–11.0)

## 2017-04-18 LAB — GLUCOSE, CAPILLARY
Glucose-Capillary: 133 mg/dL — ABNORMAL HIGH (ref 65–99)
Glucose-Capillary: 134 mg/dL — ABNORMAL HIGH (ref 65–99)
Glucose-Capillary: 135 mg/dL — ABNORMAL HIGH (ref 65–99)
Glucose-Capillary: 137 mg/dL — ABNORMAL HIGH (ref 65–99)
Glucose-Capillary: 139 mg/dL — ABNORMAL HIGH (ref 65–99)
Glucose-Capillary: 154 mg/dL — ABNORMAL HIGH (ref 65–99)

## 2017-04-18 LAB — MAGNESIUM: Magnesium: 2.3 mg/dL (ref 1.7–2.4)

## 2017-04-18 MED ORDER — DOXYCYCLINE HYCLATE 100 MG IV SOLR
100.0000 mg | Freq: Two times a day (BID) | INTRAVENOUS | Status: AC
  Administered 2017-04-18 – 2017-04-22 (×9): 100 mg via INTRAVENOUS
  Filled 2017-04-18 (×11): qty 100

## 2017-04-18 MED ORDER — LACTATED RINGERS IV SOLN
INTRAVENOUS | Status: DC
  Administered 2017-04-18 – 2017-04-19 (×2): via INTRAVENOUS

## 2017-04-18 MED ORDER — IPRATROPIUM-ALBUTEROL 0.5-2.5 (3) MG/3ML IN SOLN
3.0000 mL | Freq: Four times a day (QID) | RESPIRATORY_TRACT | Status: DC
  Administered 2017-04-18 – 2017-04-22 (×16): 3 mL via RESPIRATORY_TRACT
  Filled 2017-04-18 (×16): qty 3

## 2017-04-18 MED ORDER — FENTANYL CITRATE (PF) 100 MCG/2ML IJ SOLN
12.5000 ug | INTRAMUSCULAR | Status: DC | PRN
  Administered 2017-04-19: 25 ug via INTRAVENOUS
  Filled 2017-04-18: qty 2

## 2017-04-18 MED ORDER — METHYLPREDNISOLONE SODIUM SUCC 40 MG IJ SOLR
40.0000 mg | Freq: Two times a day (BID) | INTRAMUSCULAR | Status: DC
  Administered 2017-04-18 – 2017-04-19 (×2): 40 mg via INTRAVENOUS
  Filled 2017-04-18 (×2): qty 1

## 2017-04-18 MED ORDER — ALBUTEROL SULFATE (2.5 MG/3ML) 0.083% IN NEBU: 2.5000 mg | INHALATION_SOLUTION | RESPIRATORY_TRACT | Status: DC | PRN

## 2017-04-18 NOTE — Progress Notes (Signed)
[facility] - [place] at [facility]   PATIENT NAME: Alicia Rivera    MR#:  [id number]  DATE OF BIRTH:  07/06/1956  Intubated/sedated.  CHIEF COMPLAINT:   Chief Complaint  Patient presents with  . Shortness of Breath   Patient transferred to ICU yestrday and had to be intubated after Bipap. Today she is on full vent support.  REVIEW OF SYSTEMS:    Review of Systems  Unable to perform ROS: Critical illness  Constitutional: Positive for malaise/fatigue. Negative for chills and fever.  HENT: Negative for sore throat.   Eyes: Negative for blurred vision, double vision and pain.  Respiratory: Positive for cough and shortness of breath. Negative for hemoptysis and wheezing.   Cardiovascular: Negative for chest pain, palpitations, orthopnea and leg swelling.  Gastrointestinal: Positive for nausea. Negative for abdominal pain, constipation, diarrhea, heartburn and vomiting.  Genitourinary: Negative for dysuria and hematuria.  Musculoskeletal: Negative for back pain and joint pain.  Skin: Negative for rash.  Neurological: Positive for weakness. Negative for sensory change, speech change, focal weakness and headaches.  Endo/Heme/Allergies: Does not bruise/bleed easily.  Psychiatric/Behavioral: Negative for depression. The patient is not nervous/anxious.     DRUG ALLERGIES:   Allergies  Allergen Reactions  . Sulfa Antibiotics     VITALS:  Blood pressure (!) 105/58, pulse 74, temperature 98.1 F (36.7 C), temperature source Oral, resp. rate 15, height 5\' 5"  (1.651 m), weight 84.2 kg (185 lb 10 oz), SpO2 94 %.  PHYSICAL EXAMINATION:   Physical Exam  GENERAL:  61 y.o.-year-old patient lying in the bed, intubated. sedated EYES: Pupils equal, round, reactive to light  . No scleral icterus. Extraocular muscles intact.  HEENT: Head atraumatic, normocephalic. Oropharynx and nasopharynx clear.  NECK:  Supple, no jugular venous distention. No thyroid enlargement, no  tenderness.  LUNGS:  Decreased air entry,, less wheezing. CARDIOVASCULAR: S1, S2 normal. No murmurs, rubs, or gallops.  ABDOMEN: Soft,  nondistended. Bowel sounds present. Marland Kitchen.  EXTREMITIES: No cyanosis, clubbing or edema b/l.    PSYCHIATRIC: The patient is sedated SKIN: No obvious rash, lesion, or ulcer.   LABORATORY PANEL:   CBC  Recent Labs Lab 04/18/17 0541  WBC 25.0*  HGB 12.6  HCT 37.7  PLT 339   ------------------------------------------------------------------------------------------------------------------ Chemistries   Recent Labs Lab 04/16/17 0009  04/18/17 0541  NA 123*  < > 128*  K 3.8  < > 5.3*  CL 86*  < > 92*  CO2 22  < > 22  GLUCOSE 136*  < > 133*  BUN 7  < > 45*  CREATININE 0.74  < > 2.07*  CALCIUM 9.5  < > 7.8*  MG 1.8  < > 2.3  AST 36  --   --   ALT 21  --   --   ALKPHOS 80  --   --   BILITOT 0.6  --   --   < > = values in this interval not displayed. ------------------------------------------------------------------------------------------------------------------  Cardiac Enzymes  Recent Labs Lab 04/16/17 0009  TROPONINI <0.03   ------------------------------------------------------------------------------------------------------------------  RADIOLOGY:  Dg Abd 1 View  Result Date: 04/16/2017 CLINICAL DATA:  Evaluate NG tube placement EXAM: ABDOMEN - 1 VIEW COMPARISON:  None. FINDINGS: The distal tip of the NG tube is within the antrum of the stomach. IMPRESSION: NG tube placement as above. Electronically Signed   By: Gerome Samavid  Williams III M.D   On: 04/16/2017 17:09   Dg Chest Port 1 View  Result Date: 04/18/2017 CLINICAL DATA:  Respiratory failure.  Subsequent encounter. EXAM: PORTABLE CHEST 1 VIEW COMPARISON:  04/16/2017 FINDINGS: Cardiac silhouette is normal in size. No mediastinal or hilar masses. Lungs are hyperexpanded. Right greater than left lung base streaky type opacity is noted that is likely combination of chronic bronchitic  change in atelectasis. Bronchopneumonia on the right should be considered in the proper clinical setting. There is no evidence of pulmonary edema. No convincing pleural effusion and no pneumothorax. Endotracheal tube tip now projects 4 cm above the carina. Left internal jugular central venous line tip projects in the mid superior vena cava. Nasogastric tube passes below the diaphragm into the stomach. IMPRESSION: 1. Findings are similar to the prior exam allowing for differences in lung volume and patient positioning. 2. Lung base opacity is noted more evident on the right, likely due to combination atelectasis and chronic bronchitic change. Consider right lower lobe bronchopneumonia if there are consistent clinical symptoms. Lungs are hyperexpanded consistent with underlying COPD. 3. Support apparatus is well positioned. Electronically Signed   By: Amie Portland M.D.   On: 04/18/2017 07:58   Dg Chest Port 1 View  Result Date: 04/16/2017 CLINICAL DATA:  Evaluate NG and ET tube placements EXAM: PORTABLE CHEST 1 VIEW COMPARISON:  Chest x-ray April 16, 2017 FINDINGS: The ETT terminates in the origin of the right mainstem bronchus. Recommend withdrawing 3 cm. The NG tube appears to terminate below the left hemidiaphragm was poorly visualized. A new left central line terminates in the central SVC. No pneumothorax. No pulmonary nodules or masses. Mild bibasilar atelectasis. No overt edema. The cardiomediastinal silhouette is stable. IMPRESSION: 1. The distal tip of the ET tube is within the mainstem bronchus on the right. Recommend withdrawing 3 cm. 2. Other support apparatus as above.  No pneumothorax. 3. Mild bibasilar atelectasis. Findings called to the patient's nurse, Gearldine Bienenstock. Electronically Signed   By: Gerome Sam III M.D   On: 04/16/2017 17:08     ASSESSMENT AND PLAN:   * Acute COPD exacerbation with acute hypoxic respiratory failure Steroids, nebs. Intubated. Continue full vent support.  *  Hypertensive emergency this is improved.   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 Stopped IV fluids. Fluid restriction. Sodium is improving.  * Leukocytosis likely from steroid use. No infiltrates on chest x-ray.  * DVT prophylaxis with Lovenox  All the records are reviewed and case discussed with Care Management/Social Worker Management plans discussed with the patient, family and they are in agreement.  CODE STATUS: FULL CODE  DVT Prophylaxis: SCDs  TOTAL TIME TAKING CARE OF THIS PATIENT: 25 minutes.   Katha Hamming M.D on 04/18/2017 at 9:07 AM  Between 7am to 6pm - Pager - [phone]  After 6pm go to [email or web address] - password [email or web address]  [facility] [place] [facility]  Office  [phone]  CC: Primary care physician; [facility]  Note: This dictation was prepared with Dragon dictation along with smaller phrase technology. Any transcriptional errors that result from this process are unintentional.

## 2017-04-18 NOTE — Progress Notes (Signed)
   Name: Alicia Rivera MRN: [id number] DOB: 02/10/1956    ADMISSION DATE:  04/15/2017 CONSULTATION DATE: 04/16/2017  REFERRING MD : Dr. Elpidio AnisSudini  CHIEF COMPLAINT: Shortness of Breath   BRIEF PATIENT DESCRIPTION:  61 yo female admitted 07/11 with acute on chronic hypoxic respiratory failure secondary to AECOPD requiring Bipap.  Failed BiPAP and was intubated on 7/12  MAJOR EVENTS/TEST RESULTS: 07/11-Pt admitted to the telemetry unit 07/12-Pt transferred to ICU  07/12 Intubated for severe respiratory distress  INDWELLING DEVICES:: ETT 07/12 >>  L IJ CVL 07/12 >>   MICRO DATA: MRSA PCR 07/12 >> NEG Resp 07/14 >>   ANTIMICROBIALS:  Azithromycin 07/12 >> 07/13 Doxycycline 07/14 >>    STUDIES:     SUBJ: RASS -2, -3. + F/C. Tolerates PSV 5 cm H2O but periods of apnea   VITAL SIGNS: Temp:  [97.6 F (36.4 C)-99.1 F (37.3 C)] 98.1 F (36.7 C) (07/14 0800) Pulse Rate:  [61-93] 87 (07/14 1030) Resp:  [11-18] 12 (07/14 1030) BP: (87-120)/(48-75) 114/60 (07/14 1030) SpO2:  [93 %-98 %] 95 % (07/14 1030) FiO2 (%):  [40 %] 40 % (07/14 0730) Weight:  [185 lb 10 oz (84.2 kg)] 185 lb 10 oz (84.2 kg) (07/14 0313)  PHYSICAL EXAMINATION: General: intubated, sedated, RASS -3, + F/C Neuro: CNs intact, MAEs HEENT: NCAT, sclerae white Cardiovascular: Reg, no M Lungs: improved wheezes, prolonged expiration Abdomen: Soft, +BS  Ext: warm, no edema   Recent Labs Lab 04/16/17 1321 04/17/17 0447 04/18/17 0541  NA 121* 124* 128*  K 3.7 4.4 5.3*  CL 85* 92* 92*  CO2 23 25 22   BUN 12 20 45*  CREATININE 0.77 1.30* 2.07*  GLUCOSE 140* 136* 133*    Recent Labs Lab 04/16/17 0009 04/17/17 0447 04/18/17 0541  HGB 15.7 12.5 12.6  HCT 44.9 36.4 37.7  WBC 16.5* 32.2* 25.0*  PLT 389 350 339   CXR: RLL infiltrate vs atx  ASSESSMENT / PLAN: Acute hypoxic/hypercarbic respiratory failure  AECOPD Current smoker Mild hyponatremia AKI, nonoliguric Mild steroid induced  hyperglycemia Excessive sedation  PLAN Cont vent support - settings reviewed and/or adjusted Cont vent bundle Daily SBT if/when meets criteria Cont systemic steroids Cont nebulized steroids and bronchodilators Monitor BMET intermittently Monitor I/Os Correct electrolytes as indicated Monitor temp, WBC count Micro and abx as above Holding all sedating meds Possible extubation later today if more awake Son and daughter updated @ bedside   CCM X 35 mins  Billy Fischeravid Joellen Tullos, MD PCCM service Mobile [phone] Pager [phone] 04/18/2017 11:08 AM

## 2017-04-19 ENCOUNTER — Inpatient Hospital Stay: Payer: Medicare Other

## 2017-04-19 DIAGNOSIS — R410 Disorientation, unspecified: Secondary | ICD-10-CM

## 2017-04-19 LAB — CBC
HCT: 36.1 % (ref 35.0–47.0)
Hemoglobin: 12.3 g/dL (ref 12.0–16.0)
MCH: 32.2 pg (ref 26.0–34.0)
MCHC: 34.1 g/dL (ref 32.0–36.0)
MCV: 94.4 fL (ref 80.0–100.0)
PLATELETS: 250 10*3/uL (ref 150–440)
RBC: 3.82 MIL/uL (ref 3.80–5.20)
RDW: 13.7 % (ref 11.5–14.5)
WBC: 24.2 10*3/uL — AB (ref 3.6–11.0)

## 2017-04-19 LAB — BLOOD GAS, ARTERIAL
Acid-Base Excess: 6.1 mmol/L — ABNORMAL HIGH (ref 0.0–2.0)
Bicarbonate: 29.6 mmol/L — ABNORMAL HIGH (ref 20.0–28.0)
DELIVERY SYSTEMS: POSITIVE
Expiratory PAP: 6
FIO2: 0.35
Inspiratory PAP: 12
O2 Saturation: 98.3 %
PCO2 ART: 38 mmHg (ref 32.0–48.0)
PH ART: 7.5 — AB (ref 7.350–7.450)
Patient temperature: 37
pO2, Arterial: 100 mmHg (ref 83.0–108.0)

## 2017-04-19 LAB — GLUCOSE, CAPILLARY
Glucose-Capillary: 118 mg/dL — ABNORMAL HIGH (ref 65–99)
Glucose-Capillary: 119 mg/dL — ABNORMAL HIGH (ref 65–99)
Glucose-Capillary: 136 mg/dL — ABNORMAL HIGH (ref 65–99)
Glucose-Capillary: 137 mg/dL — ABNORMAL HIGH (ref 65–99)
Glucose-Capillary: 138 mg/dL — ABNORMAL HIGH (ref 65–99)
Glucose-Capillary: 145 mg/dL — ABNORMAL HIGH (ref 65–99)

## 2017-04-19 LAB — BASIC METABOLIC PANEL
ANION GAP: 6 (ref 5–15)
BUN: 54 mg/dL — ABNORMAL HIGH (ref 6–20)
CO2: 25 mmol/L (ref 22–32)
Calcium: 8.5 mg/dL — ABNORMAL LOW (ref 8.9–10.3)
Chloride: 99 mmol/L — ABNORMAL LOW (ref 101–111)
Creatinine, Ser: 1.47 mg/dL — ABNORMAL HIGH (ref 0.44–1.00)
GFR calc Af Amer: 43 mL/min — ABNORMAL LOW (ref >60)
GFR calc non Af Amer: 37 mL/min — ABNORMAL LOW (ref >60)
GLUCOSE: 129 mg/dL — AB (ref 65–99)
Potassium: 4.6 mmol/L (ref 3.5–5.1)
Sodium: 130 mmol/L — ABNORMAL LOW (ref 135–145)

## 2017-04-19 LAB — PROCALCITONIN

## 2017-04-19 MED ORDER — HYDRALAZINE HCL 20 MG/ML IJ SOLN
INTRAMUSCULAR | Status: AC
  Administered 2017-04-19: 10 mg via INTRAVENOUS
  Filled 2017-04-19: qty 1

## 2017-04-19 MED ORDER — NALOXONE HCL 0.4 MG/ML IJ SOLN
0.4000 mg | Freq: Once | INTRAMUSCULAR | Status: AC
  Administered 2017-04-19: 0.4 mg via INTRAVENOUS

## 2017-04-19 MED ORDER — NALOXONE HCL 0.4 MG/ML IJ SOLN
INTRAMUSCULAR | Status: AC
  Administered 2017-04-19: 0.4 mg via INTRAVENOUS
  Filled 2017-04-19: qty 1

## 2017-04-19 MED ORDER — NALOXONE HCL 0.4 MG/ML IJ SOLN: 0.4000 mg | INTRAMUSCULAR | Status: DC | PRN

## 2017-04-19 MED ORDER — FUROSEMIDE 10 MG/ML IJ SOLN
40.0000 mg | Freq: Once | INTRAMUSCULAR | Status: AC
  Administered 2017-04-19: 40 mg via INTRAVENOUS
  Filled 2017-04-19: qty 4

## 2017-04-19 MED ORDER — METHYLPREDNISOLONE SODIUM SUCC 40 MG IJ SOLR
40.0000 mg | INTRAMUSCULAR | Status: DC
  Administered 2017-04-20 – 2017-04-21 (×2): 40 mg via INTRAVENOUS
  Filled 2017-04-19 (×2): qty 1

## 2017-04-19 MED ORDER — HYDRALAZINE HCL 20 MG/ML IJ SOLN
10.0000 mg | INTRAMUSCULAR | Status: DC | PRN
  Administered 2017-04-19 – 2017-04-20 (×3): 10 mg via INTRAVENOUS
  Administered 2017-04-20 – 2017-04-21 (×2): 20 mg via INTRAVENOUS
  Filled 2017-04-19 (×3): qty 1

## 2017-04-19 MED ORDER — BLISTEX MEDICATED EX OINT
TOPICAL_OINTMENT | CUTANEOUS | Status: DC | PRN
  Administered 2017-04-19: 1 via TOPICAL
  Filled 2017-04-19 (×2): qty 6.3

## 2017-04-19 NOTE — Progress Notes (Signed)
eLink Physician-Brief Progress Note Patient Name: Alicia Rivera DOB: 08/24/1956 MRN: [id number]   Date of Service  04/19/2017  HPI/Events of Note  Patient postextubation. Camera check shows patient with staff at bedside. Patient is being placed back on BiPAP.   eICU Interventions  Continuing close monitoring on BiPAP.      Intervention Category Major Interventions: Respiratory failure - evaluation and management  [name] 04/19/2017, 11:57 PM

## 2017-04-19 NOTE — Progress Notes (Signed)
   Name: Alicia Rivera MRN: [id number] DOB: 04/14/1956    ADMISSION DATE:  04/15/2017 CONSULTATION DATE: 04/16/2017  REFERRING MD : Dr. Elpidio AnisSudini  CHIEF COMPLAINT: Shortness of Breath   BRIEF PATIENT DESCRIPTION:  61 yo female admitted 07/11 with acute on chronic hypoxic respiratory failure secondary to AECOPD requiring Bipap.  Failed BiPAP and was intubated on 7/12  MAJOR EVENTS/TEST RESULTS: 07/11-Pt admitted to the telemetry unit 07/12-Pt transferred to ICU  07/12 Intubated for severe respiratory distress 07/14 Too sedated to extubated. Changed to propofol infusion 07/15 Passed SBT and extubated. Hypoxemia post extubation > received naloxone with improvement in LOC and cough. Copious mucopurulent sputum. Received Lasix X 1 dose. PRN BiPAP initiated  INDWELLING DEVICES:: ETT 07/12 >> 07/15 L IJ CVL 07/12 >>   MICRO DATA: MRSA PCR 07/12 >> NEG Resp 07/14 >>   ANTIMICROBIALS:  Azithromycin 07/12 >> 07/13 Doxycycline 07/14 >>    STUDIES:     SUBJ: RASS -2, + F/C intermittently. Passed SBT and extubated. Hypoxemia without significant distress. Cough with copious purulent sputum.    VITAL SIGNS: Temp:  [97.8 F (36.6 C)-99.1 F (37.3 C)] 99.1 F (37.3 C) (07/15 0800) Pulse Rate:  [74-119] 95 (07/15 1000) Resp:  [8-21] 19 (07/15 1000) BP: (103-177)/(53-84) 169/84 (07/15 1000) SpO2:  [84 %-98 %] 84 % (07/15 1000) FiO2 (%):  [40 %] 40 % (07/15 0700) Weight:  [182 lb 5.1 oz (82.7 kg)] 182 lb 5.1 oz (82.7 kg) (07/15 0211)  PHYSICAL EXAMINATION: General: intubated, sedated, RASS -2, + F/C Neuro: CNs intact, MAEs HEENT: NCAT, sclerae white Cardiovascular: Reg, no M Lungs: scattered rhonchi and wheezes Abdomen: Soft, +BS  Ext: warm, no edema   Recent Labs Lab 04/17/17 0447 04/18/17 0541 04/19/17 0429  NA 124* 128* 130*  K 4.4 5.3* 4.6  CL 92* 92* 99*  CO2 25 22 25   BUN 20 45* 54*  CREATININE 1.30* 2.07* 1.47*  GLUCOSE 136* 133* 129*    Recent Labs Lab  04/17/17 0447 04/18/17 0541 04/19/17 0429  HGB 12.5 12.6 12.3  HCT 36.4 37.7 36.1  WBC 32.2* 25.0* 24.2*  PLT 350 339 250   CXR: RLL infiltrate vs atx  ASSESSMENT / PLAN: Acute hypoxic/hypercarbic respiratory failure  AECOPD, resolving Current smoker Mild hyponatremia - improving AKI, nonoliguric - improving Mild steroid induced hyperglycemia Delirium  PLAN: Extubated under my supervision this AM Monitor in ICU post extubation Supplemental O2 to maintain SpO2 > 88% PRN BiPAP Cont systemic steroids Cont nebulized steroids and bronchodilators Furosemide X 1 07/15 REcheck CXR in AM 07/16 Monitor BMET intermittently Monitor I/Os Correct electrolytes as indicated Monitor temp, WBC count Micro and abx as above Hold all sedating meds Son updated @ bedside   CCM X 35 mins  Billy Fischeravid Kanoa Phillippi, MD PCCM service Mobile [phone] Pager [phone] 04/19/2017 11:25 AM

## 2017-04-19 NOTE — Progress Notes (Addendum)
[employer] - [place] at [facility]   PATIENT NAME: Alicia Rivera    MR#:  [id number]  DATE OF BIRTH:  01/21/1956  Extubated today. Patient has COPD exacerbation, intubated, extubated today.   CHIEF COMPLAINT:   Chief Complaint  Patient presents with  . Shortness of Breath   .  REVIEW OF SYSTEMS:    Review of Systems  Unable to perform ROS: Critical illness  Constitutional: Positive for malaise/fatigue. Negative for chills and fever.  HENT: Negative for sore throat.   Eyes: Negative for blurred vision, double vision and pain.  Respiratory: Positive for cough and shortness of breath. Negative for hemoptysis and wheezing.   Cardiovascular: Negative for chest pain, palpitations, orthopnea and leg swelling.  Gastrointestinal: Positive for nausea. Negative for abdominal pain, constipation, diarrhea, heartburn and vomiting.  Genitourinary: Negative for dysuria and hematuria.  Musculoskeletal: Negative for back pain and joint pain.  Skin: Negative for rash.  Neurological: Positive for weakness. Negative for sensory change, speech change, focal weakness and headaches.  Endo/Heme/Allergies: Does not bruise/bleed easily.  Psychiatric/Behavioral: Negative for depression. The patient is not nervous/anxious.     DRUG ALLERGIES:   Allergies  Allergen Reactions  . Sulfa Antibiotics     VITALS:  Blood pressure (!) 169/84, pulse 95, temperature 99.1 F (37.3 C), temperature source Oral, resp. rate 19, height 5\' 5"  (1.651 m), weight 82.7 kg (182 lb 5.1 oz), SpO2 (!) 84 %.  PHYSICAL EXAMINATION:   Physical Exam  GENERAL:  61 y.o.-year-old patient lying in the bed, Extubated just now, still sedated  EYES: Pupils equal, round, reactive to light  . No scleral icterus. Extraocular muscles intact.  HEENT: Head atraumatic, normocephalic. Oropharynx and nasopharynx clear.  NECK:  Supple, no jugular venous distention. No thyroid enlargement, no tenderness.  LUNGS:  Decreased  air entry,, less wheezing. CARDIOVASCULAR: S1, S2 normal. No murmurs, rubs, or gallops.  ABDOMEN: Soft,  nondistended. Bowel sounds present. Marland Kitchen.  EXTREMITIES: No cyanosis, clubbing or edema b/l.    PSYCHIATRIC: The patient is sedated SKIN: No obvious rash, lesion, or ulcer.   LABORATORY PANEL:   CBC  Recent Labs Lab 04/19/17 0429  WBC 24.2*  HGB 12.3  HCT 36.1  PLT 250   ------------------------------------------------------------------------------------------------------------------ Chemistries   Recent Labs Lab 04/16/17 0009  04/18/17 0541 04/19/17 0429  NA 123*  < > 128* 130*  K 3.8  < > 5.3* 4.6  CL 86*  < > 92* 99*  CO2 22  < > 22 25  GLUCOSE 136*  < > 133* 129*  BUN 7  < > 45* 54*  CREATININE 0.74  < > 2.07* 1.47*  CALCIUM 9.5  < > 7.8* 8.5*  MG 1.8  < > 2.3  --   AST 36  --   --   --   ALT 21  --   --   --   ALKPHOS 80  --   --   --   BILITOT 0.6  --   --   --   < > = values in this interval not displayed. ------------------------------------------------------------------------------------------------------------------  Cardiac Enzymes  Recent Labs Lab 04/16/17 0009  TROPONINI <0.03   ------------------------------------------------------------------------------------------------------------------  RADIOLOGY:  Dg Chest Port 1 View  Result Date: 04/18/2017 CLINICAL DATA:  Respiratory failure.  Subsequent encounter. EXAM: PORTABLE CHEST 1 VIEW COMPARISON:  04/16/2017 FINDINGS: Cardiac silhouette is normal in size. No mediastinal or hilar masses. Lungs are hyperexpanded. Right greater than left lung base streaky type opacity is noted  that is likely combination of chronic bronchitic change in atelectasis. Bronchopneumonia on the right should be considered in the proper clinical setting. There is no evidence of pulmonary edema. No convincing pleural effusion and no pneumothorax. Endotracheal tube tip now projects 4 cm above the carina. Left internal jugular  central venous line tip projects in the mid superior vena cava. Nasogastric tube passes below the diaphragm into the stomach. IMPRESSION: 1. Findings are similar to the prior exam allowing for differences in lung volume and patient positioning. 2. Lung base opacity is noted more evident on the right, likely due to combination atelectasis and chronic bronchitic change. Consider right lower lobe bronchopneumonia if there are consistent clinical symptoms. Lungs are hyperexpanded consistent with underlying COPD. 3. Support apparatus is well positioned. Electronically Signed   By: Amie Portland M.D.   On: 04/18/2017 07:58     ASSESSMENT AND PLAN:   * Acute COPD exacerbation with acute hypoxic respiratory failure Steroids, nebs. Extubated today.  * Hypertensive emergency; this is improved.   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 Stopped IV fluids. Fluid restriction. Sodium is improving.  * Leukocytosis likely from steroid use. No infiltrates on chest x-ray.   Acute renal failure'improving slowly.   * DVT prophylaxis with Lovenox  All the records are reviewed and case discussed with Care Management/Social Worker Management plans discussed with the patient, family and they are in agreement.  CODE STATUS: FULL CODE  DVT Prophylaxis: SCDs  TOTAL TIME TAKING CARE OF THIS PATIENT: 25 minutes.   Katha Hamming M.D on 04/19/2017 at 10:54 AM  Between 7am to 6pm - Pager - [phone]  After 6pm go to [email or web address] - password [email or web address]  [facility]  Office  [phone]  CC: Primary care physician; [facility]  Note: This dictation was prepared with Dragon dictation along with smaller phrase technology. Any transcriptional errors that result from this process are unintentional.

## 2017-04-19 NOTE — Progress Notes (Signed)
Extubated without complications to 6lnc 

## 2017-04-20 ENCOUNTER — Inpatient Hospital Stay: Payer: Medicare Other

## 2017-04-20 ENCOUNTER — Inpatient Hospital Stay (HOSPITAL_COMMUNITY): Payer: Medicare Other

## 2017-04-20 DIAGNOSIS — G9340 Encephalopathy, unspecified: Secondary | ICD-10-CM

## 2017-04-20 LAB — BASIC METABOLIC PANEL
Anion gap: 10 (ref 5–15)
BUN: 42 mg/dL — AB (ref 6–20)
CALCIUM: 9.4 mg/dL (ref 8.9–10.3)
CO2: 27 mmol/L (ref 22–32)
Chloride: 99 mmol/L — ABNORMAL LOW (ref 101–111)
Creatinine, Ser: 0.87 mg/dL (ref 0.44–1.00)
GFR calc Af Amer: >60 mL/min (ref >60)
GLUCOSE: 137 mg/dL — AB (ref 65–99)
Potassium: 3.6 mmol/L (ref 3.5–5.1)
Sodium: 136 mmol/L (ref 135–145)

## 2017-04-20 LAB — BLOOD GAS, ARTERIAL
Acid-Base Excess: 6.6 mmol/L — ABNORMAL HIGH (ref 0.0–2.0)
Bicarbonate: 30.5 mmol/L — ABNORMAL HIGH (ref 20.0–28.0)
FIO2: 0.28
O2 Saturation: 93 %
PCO2 ART: 40 mmHg (ref 32.0–48.0)
PH ART: 7.49 — AB (ref 7.350–7.450)
Patient temperature: 37
pO2, Arterial: 61 mmHg — ABNORMAL LOW (ref 83.0–108.0)

## 2017-04-20 LAB — CBC
HCT: 39.6 % (ref 35.0–47.0)
Hemoglobin: 13.7 g/dL (ref 12.0–16.0)
MCH: 32.2 pg (ref 26.0–34.0)
MCHC: 34.6 g/dL (ref 32.0–36.0)
MCV: 93 fL (ref 80.0–100.0)
PLATELETS: 374 10*3/uL (ref 150–440)
RBC: 4.25 MIL/uL (ref 3.80–5.20)
RDW: 13.9 % (ref 11.5–14.5)
WBC: 21.7 10*3/uL — ABNORMAL HIGH (ref 3.6–11.0)

## 2017-04-20 LAB — GLUCOSE, CAPILLARY
Glucose-Capillary: 130 mg/dL — ABNORMAL HIGH (ref 65–99)
Glucose-Capillary: 147 mg/dL — ABNORMAL HIGH (ref 65–99)
Glucose-Capillary: 152 mg/dL — ABNORMAL HIGH (ref 65–99)
Glucose-Capillary: 151 mg/dL — ABNORMAL HIGH (ref 65–99)
Glucose-Capillary: 147 mg/dL — ABNORMAL HIGH (ref 65–99)

## 2017-04-20 LAB — POTASSIUM: POTASSIUM: 3.6 mmol/L (ref 3.5–5.1)

## 2017-04-20 LAB — PHOSPHORUS: PHOSPHORUS: 1.6 mg/dL — AB (ref 2.5–4.6)

## 2017-04-20 LAB — PROCALCITONIN: Procalcitonin: <0.1 ng/mL

## 2017-04-20 LAB — LACTIC ACID, PLASMA: LACTIC ACID, VENOUS: 1.2 mmol/L (ref 0.5–1.9)

## 2017-04-20 LAB — MAGNESIUM: MAGNESIUM: 1.8 mg/dL (ref 1.7–2.4)

## 2017-04-20 MED ORDER — MAGNESIUM SULFATE 2 GM/50ML IV SOLN
2.0000 g | Freq: Once | INTRAVENOUS | Status: AC
  Administered 2017-04-20: 2 g via INTRAVENOUS
  Filled 2017-04-20: qty 50

## 2017-04-20 MED ORDER — FLEET ENEMA 7-19 GM/118ML RE ENEM
1.0000 | ENEMA | Freq: Once | RECTAL | Status: AC
  Administered 2017-04-20: 1 via RECTAL

## 2017-04-20 MED ORDER — NALOXONE HCL 2 MG/2ML IJ SOSY
0.5000 ug/kg/h | PREFILLED_SYRINGE | INTRAVENOUS | Status: DC
  Administered 2017-04-20: 0.5 ug/kg/h via INTRAVENOUS
  Filled 2017-04-20: qty 2

## 2017-04-20 MED ORDER — CLONIDINE HCL 0.1 MG/24HR TD PTWK
0.1000 mg | MEDICATED_PATCH | TRANSDERMAL | Status: DC
  Administered 2017-04-20: 0.1 mg via TRANSDERMAL
  Filled 2017-04-20: qty 1

## 2017-04-20 MED ORDER — PANTOPRAZOLE SODIUM 40 MG IV SOLR
40.0000 mg | INTRAVENOUS | Status: DC
  Administered 2017-04-20: 40 mg via INTRAVENOUS
  Filled 2017-04-20: qty 40

## 2017-04-20 MED ORDER — POTASSIUM PHOSPHATES 15 MMOLE/5ML IV SOLN
30.0000 mmol | Freq: Once | INTRAVENOUS | Status: AC
  Administered 2017-04-20: 30 mmol via INTRAVENOUS
  Filled 2017-04-20: qty 10

## 2017-04-20 MED ORDER — SODIUM CHLORIDE 0.9% FLUSH: 10.0000 mL | INTRAVENOUS | Status: DC | PRN

## 2017-04-20 NOTE — Plan of Care (Signed)
Soap suds enema administered.

## 2017-04-20 NOTE — Progress Notes (Signed)
PT Cancellation Note  Patient Details Name: Alicia Rivera MRN: [id number] DOB: 02/22/1956   Cancelled Treatment:    Reason Eval/Treat Not Completed: Patient's level of consciousness.  PT consult received.  Chart reviewed.  Nursing reports pt lethargic/decreased level of arousal and not appropriate for PT at this time.  Will re-attempt PT eval at a later date/time.  Hendricks LimesEmily Margurette Brener, PT 04/20/17, 1:37 PM [phone]

## 2017-04-20 NOTE — Progress Notes (Signed)
Pt B/p 194/93 at 2000 Informed MD, gave hydralazine 20mg , b/p 165/80 Pt taken off bipap at 2200, maintained O2 sats at 95% on [place] 3L Put pt back on bipap at 0000

## 2017-04-20 NOTE — Progress Notes (Signed)
CH responded to a request from RN to a family consultation with MD. MD informed the family of the Pt condition. The family appeared educated on their options for the present time. MD will keep them informed should the condition or options change. CH offered a calming presence and prayer. CH is available for follow up as needed.    04/20/17 1400  Clinical Encounter Type  Visited With Family;Health care provider  Visit Type Initial;Spiritual support;Critical Care  Referral From Nurse  Consult/Referral To Chaplain  Spiritual Encounters  Spiritual Needs Prayer;Emotional

## 2017-04-20 NOTE — Progress Notes (Signed)
04/20/17 0800  Charting Type  Charting Type Shift assessment  Orders Chart Check (once per shift) Completed  Neurological  Neuro (WDL) X  Level of Consciousness Responds to Voice  Orientation Level Oriented to person;Other (comment) (open eyes more )  Cognition Follows commands  Speech Nods/gestures appropriately  Pupil Assessment  Yes  R Pupil Size (mm) 3  R Pupil Shape Round  R Pupil Reaction Sluggish  L Pupil Size (mm) 2  L Pupil Shape Round  L Pupil Reaction Sluggish  Additional Pupil Assessments No  Motor Function/Sensation Assessment Grip;Motor response  R Hand Grip Weak  L Hand Grip Weak   RUE Motor Response Non-purposeful movment (some movement to command )  LUE Motor Response Non-purposeful movement (some movement to command )  RLE Motor Response Non-purposeful movement (some movement to command )  LLE Motor Response Non-purposeful movement (some movement to command )  Glasgow Coma Scale  Eye Opening 3  Best Verbal Response (NON-intubated) 4  Best Motor Response 4  Glasgow Coma Scale Score 11  Richmond Agitation Sedation Scale  Richmond Agitation Sedation Scale (RASS) -1  RASS Goal 0  CAM-ICU Feature 1:  Acute onset of fluctuating course  Is the patient different from his/her baseline mental status? Yes  Has the patient had any fluctuation in mental status in the past 24 hours? Yes  CAM-ICU Feature 2:  Inattention  Number of errors greater than 2? Yes  CAM-ICU Feature 3:  Altered level of consciousness  RASS anything other than zero? No  CAM-ICU Feature 4:  Disorganized thinking  Combined number of errors greater than 1? Yes  CAM-ICU Positive? Yes  Delirium Prevention:  Universal Requirements (Complete for all ICU patients)  Universal Precautions Initiated *See Row Information* Yes  Interventions for patient with positive CAM  CAM Positive:  Interventions Utilize bed alarms;Continue Universal (preventative) measures  HEENT  HEENT (WDL) X  Vision Check  No  Teeth Missing (Comment)  Tongue Pink;Moist  Mucous Membrane(s) Moist;Pink  Voice Other (Comment) (incomprehensible)  Respiratory  Respiratory (WDL) X  Cough None  Respiratory Pattern Regular;Unlabored;Symmetrical  Chest Assessment Chest expansion symmetrical  Bilateral Breath Sounds Diminished  Cardiac  Cardiac (WDL) X  Pulse Regular  Heart Sounds S1, S2  Jugular Venous Distention (JVD) No  Cardiac Rhythm NSR;ST  Vascular  Vascular (WDL) X  Cyanosis None  Capillary Refill Less than 3 seconds  Pulses R radial;L radial;R dorsalis pedis;L dorsalis pedis  Edema Right upper extremity;Left upper extremity;Right lower extremity;Left lower extremity  RUE Edema Non-pitting  LUE Edema Non-pitting  RLE Edema +1  LLE Edema +1  RUE Neurovascular Assessment  R Radial Pulse +2  LUE Neurovascular Assessment  L Radial Pulse +2  RLE Neurovascular Assessment  R Dorsalis Pedis Pulse +1  LLE Neurovascular Assessment  L Dorsalis Pedis Pulse +1  Integumentary  Integumentary (WDL) X  Skin Color Appropriate for ethnicity  Skin Condition Dry  Skin Integrity Intact  Skin Turgor Non-tenting  Sacral Foam Prophylactic Dressing Protocol  Dressing Interventions Dressing changed  Dressing Change Due 04/25/17  Braden Scale (Ages 8 and up)  Sensory Perceptions 3  Moisture 4  Activity 1  Mobility 3  Nutrition 2  Friction and Shear 3  Braden Scale Score 16  Braden Interventions  Braden Scale Interventions Reposition q2h;Pillow;Heels  Musculoskeletal  Musculoskeletal (WDL) X  Assistive Device MaxiSlide  Generalized Weakness Yes  Weight Bearing Restrictions No  Gastrointestinal  Gastrointestinal (WDL) X  Abdomen Inspection Soft;Obese  Bowel Sounds Assessment Hypoactive  Tenderness Nontender  Last BM Date 04/20/17  Passing Flatus No  GI Symptoms Constipation  Gastrointestinal Additional Assessments None  Constipation  Constipation interventions Disimpaction  Enema  Enema Type Soap  suds  GU Assessment  Genitourinary (WDL) X  Genitourinary Symptoms Urinary Catheter  Genitalia  Female Genitalia Intact  Urine Characteristics  Hygiene Peri care;Foley care  Urethral Catheter Luther ParodyBrandi M, RN 14 Fr.  Placement Date/Time: 04/16/17 1700   Perineal care performed prior to insertion?: Yes  Person Inserting Catheter: Luther ParodyBrandi M, RN  Person Assisting with Catheter Insertion: Chasity B, RN  Patient Location at Time of Insertion: ICU 3  Tube Size (Fr.): 14 ...  Indication for Insertion or Continuance of Catheter Unstable critical patients (first 24-48 hours)  Site Assessment Clean;Intact;Dry  Catheter Maintenance Bag below level of bladder;Catheter secured;Drainage bag/tubing not touching floor;Seal intact;No dependent loops;Insertion date on drainage bag;Bag emptied prior to Industrial/product designertransport  Collection Container Standard drainage bag  Securement Method Securing device (Describe)  Urinary Catheter Interventions Unclamped  Psychosocial  Psychosocial (WDL) X  Patient Behaviors Calm;Not interactive  Family Behavior Supportive;Anxious  Emotional support given Given to patient;Given to patient's family

## 2017-04-20 NOTE — Progress Notes (Signed)
EEG reviewed and shows no evidence of seizure activity.  Full report to follow.  Thana FarrLeslie Lem Peary, MD Neurology [phone]

## 2017-04-20 NOTE — Progress Notes (Signed)
[employer] - [place] at [facility]   PATIENT NAME: Alicia Rivera    MR#:  [id number]  DATE OF BIRTH:  16-Feb-1956  SUBJECTIVE:   Patient here due to shortness of breath from acute COPD exacerbation, extubated but mental status still not back to baseline.  given some Narcan and mental status improved and then started on a Narcan drip but mental status still not improving. CT head/MRI Brain negative.  REVIEW OF SYSTEMS:    Review of Systems  Unable to perform ROS: Mental acuity    Nutrition: NPO Tolerating Diet: NO Tolerating PT: Await Eval.   DRUG ALLERGIES:   Allergies  Allergen Reactions  . Sulfa Antibiotics     VITALS:  Blood pressure (!) 176/84, pulse (!) 115, temperature 99.2 F (37.3 C), temperature source Oral, resp. rate (!) 23, height 5\' 5"  (1.651 m), weight 83.9 kg (184 lb 15.5 oz), SpO2 95 %.  PHYSICAL EXAMINATION:   Physical Exam  GENERAL:  61 y.o.-year-old patient lying in bed lethargic/Encephalopathic.  EYES: Pupils equal, round, reactive to light and accommodation. No scleral icterus. Extraocular muscles intact.  HEENT: Head atraumatic, normocephalic. Oropharynx and nasopharynx clear.  NECK:  Supple, no jugular venous distention. No thyroid enlargement, no tenderness.  LUNGS: Normal breath sounds bilaterally, no wheezing, rales, rhonchi. No use of accessory muscles of respiration.  CARDIOVASCULAR: S1, S2 normal. No murmurs, rubs, or gallops.  ABDOMEN: Soft, nontender, nondistended. Bowel sounds present. No organomegaly or mass.  EXTREMITIES: No cyanosis, clubbing or edema b/l.    NEUROLOGIC: Cranial nerves II through XII are intact. No focal Motor or sensory deficits b/l.  Globally weak.  PSYCHIATRIC: The patient is alert and oriented x 1.  SKIN: No obvious rash, lesion, or ulcer.    LABORATORY PANEL:   CBC  Recent Labs Lab 04/20/17 0521  WBC 21.7*  HGB 13.7  HCT 39.6  PLT 374    ------------------------------------------------------------------------------------------------------------------  Chemistries   Recent Labs Lab 04/16/17 0009  04/20/17 0521 04/20/17 1331  NA 123*  < > 136  --   K 3.8  < > 3.6 3.6  CL 86*  < > 99*  --   CO2 22  < > 27  --   GLUCOSE 136*  < > 137*  --   BUN 7  < > 42*  --   CREATININE 0.74  < > 0.87  --   CALCIUM 9.5  < > 9.4  --   MG 1.8  < >  --  1.8  AST 36  --   --   --   ALT 21  --   --   --   ALKPHOS 80  --   --   --   BILITOT 0.6  --   --   --   < > = values in this interval not displayed. ------------------------------------------------------------------------------------------------------------------  Cardiac Enzymes  Recent Labs Lab 04/16/17 0009  TROPONINI <0.03   ------------------------------------------------------------------------------------------------------------------  RADIOLOGY:  Dg Abd 1 View  Result Date: 04/19/2017 CLINICAL DATA:  61 year old female unable to hold her breath. Abdominal discomfort. Constipation. EXAM: ABDOMEN - 1 VIEW COMPARISON:  04/16/2017. FINDINGS: Gas and stool are noted throughout the colon and rectum. No pathologic dilatation of small bowel. No pneumoperitoneum. IMPRESSION: 1. Nonspecific, nonobstructive bowel gas pattern. 2. No pneumoperitoneum. Electronically Signed   By: Trudie Reed M.D.   On: 04/19/2017 22:19   Ct Head Wo Contrast  Result Date: 04/20/2017 CLINICAL DATA:  Acute encephalopathy EXAM: CT HEAD WITHOUT CONTRAST  TECHNIQUE: Contiguous axial images were obtained from the base of the skull through the vertex without intravenous contrast. COMPARISON:  CT head 09/15/2006 FINDINGS: Brain: No evidence of acute infarction, hemorrhage, hydrocephalus, extra-axial collection or mass lesion/mass effect. Vascular: Negative for hyperdense vessel Skull: Negative Sinuses/Orbits: Negative Other: None IMPRESSION: Negative CT head Electronically Signed   By: Marlan Palauharles  Clark  M.D.   On: 04/20/2017 10:51   Mr Brain Wo Contrast  Result Date: 04/20/2017 CLINICAL DATA:  Shortness of breath for 1 week. Respiratory distress. Hypoxemia. Anxiety. Altered mental status. EXAM: MRI HEAD WITHOUT CONTRAST TECHNIQUE: Multiplanar, multiecho pulse sequences of the brain and surrounding structures were obtained without intravenous contrast. COMPARISON:  CT head 04/20/2017. FINDINGS: The patient was unable to remain motionless for the exam. Small or subtle lesions could be overlooked. Brain: No acute infarction, hemorrhage, hydrocephalus, extra-axial collection or mass lesion. Normal for age cerebral volume. No significant white matter disease. Vascular: Flow voids are maintained throughout the carotid, basilar, and vertebral arteries. There are no areas of chronic hemorrhage. Skull and upper cervical spine: Normal marrow signal. Suspected cervical spondylosis, possible spinal stenosis at C3-C4. No definite tonsillar herniation. Sinuses/Orbits: Negative. Other: None. Compared with earlier CT, good general agreement. IMPRESSION: Motion degraded exam demonstrating no definite acute intracranial abnormality. Electronically Signed   By: Elsie StainJohn T Curnes M.D.   On: 04/20/2017 13:22   Dg Chest Port 1 View  Result Date: 04/20/2017 CLINICAL DATA:  Respiratory failure. EXAM: PORTABLE CHEST 1 VIEW COMPARISON:  04/18/2017.  04/16/2017.  04/12/2017 .  CT 04/12/2017. FINDINGS: Interim extubation and removal of NG tube. Left IJ line in stable position. Heart size normal. Persistent bibasilar subsegmental atelectasis. Slight increase in interstitial markings in the left mid lung field. Pneumonitis cannot be excluded. No pleural effusion or pneumothorax. Heart size stable. IMPRESSION: 1. Interim extubation removal of NG tube. Left IJ line stable position. 2. Persistent bibasilar subsegmental atelectasis. Slight increase in interstitial markings in the left mid lung field. Pneumonitis cannot be excluded.  Electronically Signed   By: Maisie Fushomas  Register   On: 04/20/2017 06:47     ASSESSMENT AND PLAN:   61 year old female with past medical history of COPD, hypertension, who presented to the hospital due to shortness of breath and noted to be in acute respiratory failure with hypoxia secondary to COPD exacerbation.  1. COPD exacerbation-this the cause of patient's acute respiratory failure with hypoxia -Improved, and extubated yesterday. -Respiratory status stable, continue scheduled DuoNeb's, Pulmicort nebs, IV steroids. Chest x-ray negative for acute pneumonia. Continue empiric doxycycline. Appreciate pulmonary input.   2. Altered mental status/encephalopathy-etiology unclear presently. CT head, MRI of the brain is negative for acute pathology. Initially patient's mental status improved with Narcan but despite being on Narcan drip patient remains quite altered. Await neurology input.  3. 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 -Unlikely contributing to the patient's mental status change.  4. GERD-continue Protonix.  5. Essential hypertension-continue clonidine patch, as needed IV hydralazine.  All the records are reviewed and case discussed with Care Management/Social Worker. Management plans discussed with the patient, family and they are in agreement.  CODE STATUS: Full code  DVT Prophylaxis: Hep. SQ  TOTAL TIME TAKING CARE OF THIS PATIENT: 30 minutes.   POSSIBLE D/C unclear, DEPENDING ON CLINICAL CONDITION.   Houston SirenSAINANI,Ariyanna Oien J M.D on 04/20/2017 at 3:42 PM  Between 7am to 6pm - Pager - [phone]  After 6pm go to [email or web address] - Therapist, nutritionalpassword [email or web address]  [facility] [place] [facility]  Office  [phone]  CC: Primary care physician; [facility]

## 2017-04-20 NOTE — Progress Notes (Signed)
   Name: Alicia Rivera MRN: [id number] DOB: 02/01/1956    ADMISSION DATE:  04/15/2017 CONSULTATION DATE: 04/16/2017  REFERRING MD : Dr. Elpidio AnisSudini  CHIEF COMPLAINT: Shortness of Breath   BRIEF PATIENT DESCRIPTION:  61 yo female admitted 07/11 with acute on chronic hypoxic respiratory failure secondary to AECOPD requiring Bipap.  Failed BiPAP and was intubated on 7/12  MAJOR EVENTS/TEST RESULTS: 07/11-Pt admitted to the telemetry unit 07/12-Pt transferred to ICU  07/12 Intubated for severe respiratory distress 07/14 Too sedated to extubated. Changed to propofol infusion 07/15 Passed SBT and extubated. Hypoxemia post extubation > received naloxone with improvement in LOC and cough. Copious mucopurulent sputum. Received Lasix X 1 dose. PRN BiPAP initiated 7/16 -remains lethargic, high risk for intubation  INDWELLING DEVICES:: ETT 07/12 >> 07/15 L IJ CVL 07/12 >>   MICRO DATA: MRSA PCR 07/12 >> NEG Resp 07/14 >>   ANTIMICROBIALS:  Azithromycin 07/12 >> 07/13 Doxycycline 07/14 >>    STUDIES:     SUBJ: Patient extubated yesterday Patient increased work of breathing this a.m. Was on BiPAP last night High risk for intubation Will plan for Narcan infusion   VITAL SIGNS: Temp:  [98.2 F (36.8 C)-99.8 F (37.7 C)] 99.8 F (37.7 C) (07/16 0400) Pulse Rate:  [95-115] 115 (07/16 0400) Resp:  [13-24] 23 (07/16 0500) BP: (154-194)/(66-103) 178/92 (07/16 0513) SpO2:  [84 %-100 %] 100 % (07/16 0500) FiO2 (%):  [35 %] 35 % (07/16 0400) Weight:  [184 lb 15.5 oz (83.9 kg)] 184 lb 15.5 oz (83.9 kg) (07/16 0402)  PHYSICAL EXAMINATION: General: + Restaurant distress  Neuro: Lethargic and obtunded HEENT: NCAT, sclerae white Cardiovascular: Reg, no M Lungs: scattered rhonchi and wheezes Abdomen: Soft, +BS  Ext: warm, no edema   Recent Labs Lab 04/18/17 0541 04/19/17 0429 04/20/17 0521  NA 128* 130* 136  K 5.3* 4.6 3.6  CL 92* 99* 99*  CO2 22 25 27   BUN 45* 54* 42*    CREATININE 2.07* 1.47* 0.87  GLUCOSE 133* 129* 137*    Recent Labs Lab 04/18/17 0541 04/19/17 0429 04/20/17 0521  HGB 12.6 12.3 13.7  HCT 37.7 36.1 39.6  WBC 25.0* 24.2* 21.7*  PLT 339 250 374   CXR: RLL infiltrate vs atx  ASSESSMENT / PLAN: Acute hypoxic/hypercarbic respiratory failure  AECOPD-high risk for reintubation Current smoker Delirium-unknown etiology  After further assessment patient has increased work of breathing with end expiratory wheezes in the setting of lethargic and obtundation high risk for reintubation  PLAN:  Supplemental O2 to maintain SpO2 > 88% PRN BiPAP Cont systemic steroids Cont nebulized steroids and bronchodilators Furosemide X 1 07/15 REcheck CXR in AM 07/16 Monitor BMET intermittently Monitor I/Os Correct electrolytes as indicated Monitor temp, WBC count Micro and abx as above Hold all sedating meds-will start Narcan infusion    Critical Care Time devoted to patient care services described in this note is 34 minutes.   Overall, patient is critically ill, prognosis is guarded.    Lucie LeatherKurian David Kamariyah Timberlake, M.D.  [facility]  Medical Director [facility] Medical Director [facility]

## 2017-04-20 NOTE — Plan of Care (Signed)
Fleet enema giben

## 2017-04-20 NOTE — Progress Notes (Signed)
MEDICATION RELATED CONSULT NOTE   Pharmacy Consult for electrolyte management   Pharmacy consulted for electrolyte management for 61 yo female ICU patient.   Plan:  Will order potassium phosphate 30mmol IV x 1 and magnesium 2g IV x 1. Will obtain follow up electrolytes with am labs.   Allergies  Allergen Reactions  . Sulfa Antibiotics     Patient Measurements: Height: 5\' 5"  (165.1 cm) Weight: 184 lb 15.5 oz (83.9 kg) IBW/kg (Calculated) : 57  Vital Signs: Temp: 99.2 F (37.3 C) (07/16 0800) Temp Source: Oral (07/16 0800) BP: 176/84 (07/16 1200) Intake/Output from previous day: 07/15 0701 - 07/16 0700 In: 2241.8 [I.V.:1153.4; NG/GT:838.3; IV Piggyback:250] Out: 2875 [Urine:2875] Intake/Output from this shift: Total I/O In: 333.6 [P.O.:240; I.V.:93.6] Out: 800 [Urine:800]  Labs:  Recent Labs  04/18/17 0541 04/19/17 0429 04/20/17 0521 04/20/17 1331  WBC 25.0* 24.2* 21.7*  --   HGB 12.6 12.3 13.7  --   HCT 37.7 36.1 39.6  --   PLT 339 250 374  --   CREATININE 2.07* 1.47* 0.87  --   MG 2.3  --   --  1.8  PHOS 11.4*  --   --  1.6*   Estimated Creatinine Clearance: 70.8 mL/min (by C-G formula based on SCr of 0.87 mg/dL).   Microbiology: Recent Results (from the past 720 hour(s))  MRSA PCR Screening     Status: None   Collection Time: 04/16/17 12:26 PM  Result Value Ref Range Status   MRSA by PCR NEGATIVE NEGATIVE Final    Comment:        The GeneXpert MRSA Assay (FDA approved for NASAL specimens only), is one component of a comprehensive MRSA colonization surveillance program. It is not intended to diagnose MRSA infection nor to guide or monitor treatment for MRSA infections.   Culture, respiratory (NON-Expectorated)     Status: None (Preliminary result)   Collection Time: 04/18/17 11:11 AM  Result Value Ref Range Status   Specimen Description TRACHEAL ASPIRATE  Final   Special Requests NONE  Final   Gram Stain   Final    FEW WBC PRESENT,  PREDOMINANTLY MONONUCLEAR RARE GRAM POSITIVE COCCI IN PAIRS AND CHAINS    Culture   Final    CULTURE REINCUBATED FOR BETTER GROWTH Performed at [facility], [address] [place], [place] [address]    Report Status PENDING  Incomplete    Medical History: Past Medical History:  Diagnosis Date  . COPD (chronic obstructive pulmonary disease) (HCC)   . HTN (hypertension)   . Tobacco abuse     Pharmacy will continue to monitor and adjust per consult.   Aleen Marston L 04/20/2017,5:04 PM

## 2017-04-21 DIAGNOSIS — G9340 Encephalopathy, unspecified: Secondary | ICD-10-CM

## 2017-04-21 DIAGNOSIS — G9341 Metabolic encephalopathy: Secondary | ICD-10-CM

## 2017-04-21 LAB — BASIC METABOLIC PANEL
ANION GAP: 12 (ref 5–15)
BUN: 32 mg/dL — ABNORMAL HIGH (ref 6–20)
CHLORIDE: 101 mmol/L (ref 101–111)
CO2: 29 mmol/L (ref 22–32)
CREATININE: 0.85 mg/dL (ref 0.44–1.00)
Calcium: 8.7 mg/dL — ABNORMAL LOW (ref 8.9–10.3)
GFR calc non Af Amer: >60 mL/min (ref >60)
GLUCOSE: 144 mg/dL — AB (ref 65–99)
Potassium: 2.8 mmol/L — ABNORMAL LOW (ref 3.5–5.1)
Sodium: 142 mmol/L (ref 135–145)

## 2017-04-21 LAB — CULTURE, RESPIRATORY W GRAM STAIN: Culture: NORMAL

## 2017-04-21 LAB — GLUCOSE, CAPILLARY
Glucose-Capillary: 119 mg/dL — ABNORMAL HIGH (ref 65–99)
Glucose-Capillary: 123 mg/dL — ABNORMAL HIGH (ref 65–99)
Glucose-Capillary: 141 mg/dL — ABNORMAL HIGH (ref 65–99)
Glucose-Capillary: 143 mg/dL — ABNORMAL HIGH (ref 65–99)
Glucose-Capillary: 145 mg/dL — ABNORMAL HIGH (ref 65–99)
Glucose-Capillary: 147 mg/dL — ABNORMAL HIGH (ref 65–99)
Glucose-Capillary: 149 mg/dL — ABNORMAL HIGH (ref 65–99)

## 2017-04-21 LAB — MAGNESIUM: Magnesium: 1.9 mg/dL (ref 1.7–2.4)

## 2017-04-21 LAB — CULTURE, RESPIRATORY

## 2017-04-21 LAB — POTASSIUM: POTASSIUM: 3.8 mmol/L (ref 3.5–5.1)

## 2017-04-21 LAB — PROCALCITONIN: Procalcitonin: <0.1 ng/mL

## 2017-04-21 LAB — PHOSPHORUS: Phosphorus: 2.8 mg/dL (ref 2.5–4.6)

## 2017-04-21 MED ORDER — HYDRALAZINE HCL 20 MG/ML IJ SOLN: 10.0000 mg | INTRAMUSCULAR | Status: DC | PRN

## 2017-04-21 MED ORDER — POTASSIUM CHLORIDE 2 MEQ/ML IV SOLN
INTRAVENOUS | Status: DC
  Administered 2017-04-21: 14:00:00 via INTRAVENOUS
  Filled 2017-04-21 (×2): qty 1000

## 2017-04-21 MED ORDER — POTASSIUM CHLORIDE 10 MEQ/50ML IV SOLN
10.0000 meq | INTRAVENOUS | Status: DC
Start: 2017-04-21 — End: 2017-04-21
  Filled 2017-04-21 (×4): qty 50

## 2017-04-21 MED ORDER — POTASSIUM CHLORIDE 10 MEQ/50ML IV SOLN
10.0000 meq | INTRAVENOUS | Status: AC
  Administered 2017-04-21 (×6): 10 meq via INTRAVENOUS
  Filled 2017-04-21 (×6): qty 50

## 2017-04-21 NOTE — Progress Notes (Signed)
Pharmacy Consult for electrolyte management   Pharmacy consulted for electrolyte management for 61 yo female ICU patient.   Plan:  K=3.8 No supplementation needed. Repeat electrolytes in the AM.  Allergies  Allergen Reactions  . Sulfa Antibiotics    Patient Measurements: Height: 5\' 5"  (165.1 cm) Weight: 174 lb 6.1 oz (79.1 kg) IBW/kg (Calculated) : 57  Vital Signs: Temp: 98.1 F (36.7 C) (07/17 1400) Temp Source: Oral (07/17 1400) BP: 161/108 (07/17 1800) Intake/Output from previous day: 07/16 0701 - 07/17 0700 In: 1033.6 [P.O.:240; I.V.:283.6; IV Piggyback:510] Out: 1450 [Urine:1450] Intake/Output from this shift: No intake/output data recorded.  Labs:  Recent Labs  04/19/17 0429 04/20/17 0521 04/20/17 1331 04/21/17 0715  WBC 24.2* 21.7*  --   --   HGB 12.3 13.7  --   --   HCT 36.1 39.6  --   --   PLT 250 374  --   --   CREATININE 1.47* 0.87  --  0.85  MG  --   --  1.8 1.9  PHOS  --   --  1.6* 2.8   Estimated Creatinine Clearance: 70.4 mL/min (by C-G formula based on SCr of 0.85 mg/dL).  Potassium (mmol/L)  Date Value  04/21/2017 3.8  07/04/2014 4.0   Calcium (mg/dL)  Date Value  25/95/638707/17/2018 8.7 (L)   Calcium, Total (mg/dL)  Date Value  56/43/329509/29/2015 8.8   Albumin (g/dL)  Date Value  18/84/166007/09/2017 4.4  07/04/2014 3.8   Sodium (mmol/L)  Date Value  04/21/2017 142  07/04/2014 135 (L)   Medical History: Past Medical History:  Diagnosis Date  . COPD (chronic obstructive pulmonary disease) (HCC)   . HTN (hypertension)   . Tobacco abuse     Pharmacy will continue to monitor and adjust per consult.   Olene FlossMelissa D Bedie Dominey, Pharm.D, BCPS Clinical Pharmacist  04/21/2017,7:08 PM

## 2017-04-21 NOTE — Progress Notes (Signed)
[facility] [place] [facility]   PATIENT NAME: Alicia Rivera    MR#:  [id number]  DATE OF BIRTH:  09-29-1956  SUBJECTIVE:   Mental status improving and following simple commands now.  Resp. Status stable. No other acute events overnight.    REVIEW OF SYSTEMS:    Review of Systems  Constitutional: Negative for chills and fever.  HENT: Negative for congestion and tinnitus.   Eyes: Negative for blurred vision and double vision.  Respiratory: Negative for cough, shortness of breath and wheezing.   Cardiovascular: Negative for chest pain, orthopnea and PND.  Gastrointestinal: Negative for abdominal pain, diarrhea, nausea and vomiting.  Genitourinary: Negative for dysuria and hematuria.  Neurological: Negative for dizziness, sensory change and focal weakness.  All other systems reviewed and are negative.   Nutrition: Clear liquids Tolerating Diet: Yes Tolerating PT: Await Eval.   DRUG ALLERGIES:   Allergies  Allergen Reactions  . Sulfa Antibiotics     VITALS:  Blood pressure (!) 157/95, pulse (!) 104, temperature 98.1 F (36.7 C), temperature source Oral, resp. rate 19, height 5\' 5"  (1.651 m), weight 79.1 kg (174 lb 6.1 oz), SpO2 94 %.  PHYSICAL EXAMINATION:   Physical Exam  GENERAL:  61 y.o.-year-old patient lying in bed Lethargic but following commands.  EYES: Pupils equal, round, reactive to light and accommodation. No scleral icterus. Extraocular muscles intact.  HEENT: Head atraumatic, normocephalic. Oropharynx and nasopharynx clear.  NECK:  Supple, no jugular venous distention. No thyroid enlargement, no tenderness.  LUNGS: Normal breath sounds bilaterally, no wheezing, rales, rhonchi. No use of accessory muscles of respiration.  CARDIOVASCULAR: S1, S2 normal. No murmurs, rubs, or gallops.  ABDOMEN: Soft, nontender, nondistended. Bowel sounds present. No organomegaly or mass.  EXTREMITIES: No cyanosis, clubbing or edema b/l.    NEUROLOGIC:  Cranial nerves II through XII are intact. No focal Motor or sensory deficits b/l.  Globally weak.  PSYCHIATRIC: The patient is alert and oriented x 1.  SKIN: No obvious rash, lesion, or ulcer.    LABORATORY PANEL:   CBC  Recent Labs Lab 04/20/17 0521  WBC 21.7*  HGB 13.7  HCT 39.6  PLT 374   ------------------------------------------------------------------------------------------------------------------  Chemistries   Recent Labs Lab 04/16/17 0009  04/21/17 0715  NA 123*  < > 142  K 3.8  < > 2.8*  CL 86*  < > 101  CO2 22  < > 29  GLUCOSE 136*  < > 144*  BUN 7  < > 32*  CREATININE 0.74  < > 0.85  CALCIUM 9.5  < > 8.7*  MG 1.8  < > 1.9  AST 36  --   --   ALT 21  --   --   ALKPHOS 80  --   --   BILITOT 0.6  --   --   < > = values in this interval not displayed. ------------------------------------------------------------------------------------------------------------------  Cardiac Enzymes  Recent Labs Lab 04/16/17 0009  TROPONINI <0.03   ------------------------------------------------------------------------------------------------------------------  RADIOLOGY:  Dg Abd 1 View  Result Date: 04/19/2017 CLINICAL DATA:  61 year old female unable to hold her breath. Abdominal discomfort. Constipation. EXAM: ABDOMEN - 1 VIEW COMPARISON:  04/16/2017. FINDINGS: Gas and stool are noted throughout the colon and rectum. No pathologic dilatation of small bowel. No pneumoperitoneum. IMPRESSION: 1. Nonspecific, nonobstructive bowel gas pattern. 2. No pneumoperitoneum. Electronically Signed   By: Trudie Reed M.D.   On: 04/19/2017 22:19   Ct Head Wo Contrast  Result Date: 04/20/2017 CLINICAL  DATA:  Acute encephalopathy EXAM: CT HEAD WITHOUT CONTRAST TECHNIQUE: Contiguous axial images were obtained from the base of the skull through the vertex without intravenous contrast. COMPARISON:  CT head 09/15/2006 FINDINGS: Brain: No evidence of acute infarction, hemorrhage,  hydrocephalus, extra-axial collection or mass lesion/mass effect. Vascular: Negative for hyperdense vessel Skull: Negative Sinuses/Orbits: Negative Other: None IMPRESSION: Negative CT head Electronically Signed   By: Marlan Palau M.D.   On: 04/20/2017 10:51   Mr Brain Wo Contrast  Result Date: 04/20/2017 CLINICAL DATA:  Shortness of breath for 1 week. Respiratory distress. Hypoxemia. Anxiety. Altered mental status. EXAM: MRI HEAD WITHOUT CONTRAST TECHNIQUE: Multiplanar, multiecho pulse sequences of the brain and surrounding structures were obtained without intravenous contrast. COMPARISON:  CT head 04/20/2017. FINDINGS: The patient was unable to remain motionless for the exam. Small or subtle lesions could be overlooked. Brain: No acute infarction, hemorrhage, hydrocephalus, extra-axial collection or mass lesion. Normal for age cerebral volume. No significant white matter disease. Vascular: Flow voids are maintained throughout the carotid, basilar, and vertebral arteries. There are no areas of chronic hemorrhage. Skull and upper cervical spine: Normal marrow signal. Suspected cervical spondylosis, possible spinal stenosis at C3-C4. No definite tonsillar herniation. Sinuses/Orbits: Negative. Other: None. Compared with earlier CT, good general agreement. IMPRESSION: Motion degraded exam demonstrating no definite acute intracranial abnormality. Electronically Signed   By: Elsie Stain M.D.   On: 04/20/2017 13:22   Dg Chest Port 1 View  Result Date: 04/20/2017 CLINICAL DATA:  Respiratory failure. EXAM: PORTABLE CHEST 1 VIEW COMPARISON:  04/18/2017.  04/16/2017.  04/12/2017 .  CT 04/12/2017. FINDINGS: Interim extubation and removal of NG tube. Left IJ line in stable position. Heart size normal. Persistent bibasilar subsegmental atelectasis. Slight increase in interstitial markings in the left mid lung field. Pneumonitis cannot be excluded. No pleural effusion or pneumothorax. Heart size stable. IMPRESSION: 1.  Interim extubation removal of NG tube. Left IJ line stable position. 2. Persistent bibasilar subsegmental atelectasis. Slight increase in interstitial markings in the left mid lung field. Pneumonitis cannot be excluded. Electronically Signed   By: Maisie Fus  Register   On: 04/20/2017 06:47     ASSESSMENT AND PLAN:   61 year old female with past medical history of COPD, hypertension, who presented to the hospital due to shortness of breath and noted to be in acute respiratory failure with hypoxia secondary to COPD exacerbation.  1. COPD exacerbation-this is the cause of patient's acute respiratory failure with hypoxia -Improved, and extubated two days ago.  -Respiratory status stable, continue scheduled DuoNeb's, Pulmicort nebs, taken off IV steroids as per Pulm.  - chest x-ray negative for acute pneumonia. Continue empiric doxycycline. Appreciate pulmonary input.   2. Altered mental status/encephalopathy-etiology unclear presently. CT head, MRI of the brain is negative for acute pathology.  -EEG also negative for any acute seizures. Mental status much improved today patient following simple commands. Appreciate neurology input.  3. 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  4. Hypokalemia-continue to supplement. Repeat level in the morning. Check Mg.   5. GERD-continue Protonix.  6. Essential hypertension-continue clonidine patch, as needed IV hydralazine.  All the records are reviewed and case discussed with Care Management/Social Worker. Management plans discussed with the patient, family and they are in agreement.  CODE STATUS: Full code  DVT Prophylaxis: Hep. SQ  TOTAL TIME TAKING CARE OF THIS PATIENT: 25 minutes.   POSSIBLE D/C unclear, DEPENDING ON CLINICAL CONDITION.   Houston Siren M.D on 04/21/2017 at 3:51 PM  Between  7am to 6pm - Pager - [phone]  After 6pm go to [email or web address] - Therapist, nutritionalpassword [email or web address]  [facility]   Office  [phone]  CC: Primary care physician; Jenne PaneLlc, [facility]

## 2017-04-21 NOTE — Progress Notes (Signed)
   Name: Alicia Rivera MRN: [id number] DOB: 09/13/1956    ADMISSION DATE:  04/15/2017 CONSULTATION DATE: 04/16/2017  REFERRING MD : Dr. Elpidio AnisSudini  CHIEF COMPLAINT: Shortness of Breath   BRIEF PATIENT DESCRIPTION:  61 yo female admitted 07/11 with acute on chronic hypoxic respiratory failure secondary to AECOPD requiring Bipap.  Failed BiPAP and was intubated on 7/12  MAJOR EVENTS/TEST RESULTS: 07/11-Pt admitted to the telemetry unit 07/12-Pt transferred to ICU  07/12 Intubated for severe respiratory distress 07/14 Too sedated to extubated. Changed to propofol infusion 07/15 Passed SBT and extubated. Hypoxemia post extubation > received naloxone with improvement in LOC and cough. Copious mucopurulent sputum. Received Lasix X 1 dose. PRN BiPAP initiated 07/16 Remains lethargic, high risk for intubation -naloxone infusion administered 07/17 off of naloxone. Remains mildly lethargic but responsive and follows commands. No respiratory distress  INDWELLING DEVICES:: ETT 07/12 >> 07/15 L IJ CVL 07/12 >>   MICRO DATA: MRSA PCR 07/12 >> NEG Resp 07/14 >>   ANTIMICROBIALS:  Azithromycin 07/12 >> 07/13 Doxycycline 07/14 >>    STUDIES:     SUBJ:  Off of naloxone. Remains mildly lethargic but responsive and follows commands. No respiratory distress   VITAL SIGNS: Temp:  [98.1 F (36.7 C)-98.9 F (37.2 C)] 98.1 F (36.7 C) (07/17 0600) Pulse Rate:  [91-104] 104 (07/17 0600) Resp:  [17-23] 22 (07/17 0600) BP: (141-191)/(74-104) 160/102 (07/17 0600) SpO2:  [93 %-97 %] 96 % (07/17 0600) Weight:  [174 lb 6.1 oz (79.1 kg)] 174 lb 6.1 oz (79.1 kg) (07/17 0500)  PHYSICAL EXAMINATION: General: No distress Neuro: RASS -1, follows commands, no focal deficits HEENT: NCAT, sclerae white, OP normal Cardiovascular: Reg, no M Lungs: Few scattered wheezes Abdomen: Soft, +BS  Ext: warm, no edema   Recent Labs Lab 04/19/17 0429 04/20/17 0521 04/20/17 1331 04/21/17 0715  NA 130* 136  --   142  K 4.6 3.6 3.6 2.8*  CL 99* 99*  --  101  CO2 25 27  --  29  BUN 54* 42*  --  32*  CREATININE 1.47* 0.87  --  0.85  GLUCOSE 129* 137*  --  144*    Recent Labs Lab 04/18/17 0541 04/19/17 0429 04/20/17 0521  HGB 12.6 12.3 13.7  HCT 37.7 36.1 39.6  WBC 25.0* 24.2* 21.7*  PLT 339 250 374   CXR: No new film  ASSESSMENT / PLAN: Acute hypoxic/hypercarbic respiratory failure  AECOPD Current smoker Hyponatremia, resolved AKI, resolved Hypoactive delirium, improving  PLAN: Watch in ICU/SDU through today Continue supplemental O2 to maintain SpO2 > 88% Continue PRN BiPAP Discontinue systemic steroids Cont nebulized steroids and bronchodilators Monitor BMET intermittently Monitor I/Os Correct electrolytes as indicated Monitor temp, WBC count Micro and abx as above Hold all sedating meds   Billy Fischeravid Jammy Stlouis, MD PCCM service Mobile [phone] Pager [phone] 04/21/2017 1:57 PM

## 2017-04-21 NOTE — Consult Note (Signed)
Reason for Consult:Ated on MS Referring Physician: Sainani  CC: AMS  HPI: Alicia Rivera is an 61 y.o. female admitted with COPD and chest pain requiring intubation.  Was extutubated on 7/15.  Patient remained altered and did not return to baseline mental status.  Was given Narcan but despite the Narcan continued to be altered.  Consult called for further recommendations.    Past Medical History:  Diagnosis Date  . COPD (chronic obstructive pulmonary disease) ([facility])   . HTN (hypertension)   . Tobacco abuse     Past Surgical History:  Procedure Laterality Date  . none      Family History  Problem Relation Age of Onset  . Emphysema Brother     Social History:  reports that she has been smoking.  She has never used smokeless tobacco. She reports that she does not drink alcohol or use drugs.  Allergies  Allergen Reactions  . Sulfa Antibiotics     Medications:  I have reviewed the patient's current medications. Prior to Admission:  Prescriptions Prior to Admission  Medication Sig Dispense Refill Last Dose  . buPROPion (WELLBUTRIN XL) 150 MG 24 hr tablet Take 1 tablet by mouth 2 (two) times daily.  3   . busPIRone (BUSPAR) 10 MG tablet Take 1 tablet by mouth at bedtime.  0   . clonazePAM (KLONOPIN) 1 MG tablet Take 1 tablet by mouth 2 (two) times daily.  3   . DALIRESP 500 MCG TABS tablet Take 1 tablet by mouth daily.  0   . DULoxetine (CYMBALTA) 20 MG capsule Take 2 capsules by mouth every morning.  3   . hydrochlorothiazide (HYDRODIURIL) 12.5 MG tablet Take 1 tablet by mouth daily.  7   . LORazepam (ATIVAN) 1 MG tablet Take 1 tablet by mouth daily as needed.     . montelukast (SINGULAIR) 10 MG tablet Take 1 tablet by mouth daily.  5   . predniSONE (DELTASONE) 20 MG tablet Take 2 tablets by mouth daily.  0   . PROAIR HFA 108 (90 Base) MCG/ACT inhaler Inhale 1 puff into the lungs every 4 (four) hours as needed.  0   . SPIRIVA HANDIHALER 18 MCG inhalation capsule Place 1 capsule  into inhaler and inhale daily.  5   . SYMBICORT 160-4.5 MCG/ACT inhaler Inhale 1 puff into the lungs 2 (two) times daily.  1   . azithromycin (ZITHROMAX) 250 MG tablet Take 1 tablet by mouth daily.  0 Not Taking at Unknown time   Scheduled: . budesonide (PULMICORT) nebulizer solution  0.25 mg Nebulization Q6H  . chlorhexidine gluconate (MEDLINE KIT)  15 mL Mouth Rinse BID  . cloNIDine  0.1 mg Transdermal Weekly  . heparin subcutaneous  5,000 Units Subcutaneous Q8H  . ipratropium-albuterol  3 mL Nebulization Q6H  . mouth rinse  15 mL Mouth Rinse 10 times per day    ROS: Patient unable to provide due to mental status  Physical Examination: Blood pressure (!) 161/108, pulse (!) 104, temperature 98.1 F (36.7 C), temperature source Oral, resp. rate (!) 23, height '5\' 5"'$  (1.651 m), weight 79.1 kg (174 lb 6.1 oz), SpO2 96 %.  HEENT-  Normocephalic, no lesions, without obvious abnormality.  Normal external eye and conjunctiva.  Normal TM's bilaterally.  Normal auditory canals and external ears. Normal external nose, mucus membranes and septum.  Normal pharynx. Cardiovascular- S1, S2 normal, pulses palpable throughout   Lungs- chest clear, no wheezing, rales, normal symmetric air entry Abdomen- soft, non-tender;  bowel sounds normal; no masses,  no organomegaly Extremities- LE edema Lymph-no adenopathy palpable Musculoskeletal-no joint tenderness, deformity or swelling Skin-warm and dry, no hyperpigmentation, vitiligo, or suspicious lesions  Neurological Examination   Mental Status: Lethargic but able to be aroused by calling her name.  Minimal speech but appropriate to questions being asked.  Oriented to name only.  Able to follow 3 simple commands. Cranial Nerves: II: Discs flat bilaterally; Blinks to bilateral confrontation, pupils equal, round, reactive to light and accommodation III,IV, VI: ptosis not present, extra-ocular motions intact bilaterally V,VII: smile symmetric, facial light  touch sensation normal bilaterally VIII: hearing normal bilaterally IX,X: gag reflex present XI: bilateral shoulder shrug XII: midline tongue extension Motor: Lifts all extremities against gravity with no focal weakness noted.  Sensory: Responds to light noxious stimuli throughout Deep Tendon Reflexes: 3+ throughout with sustained RLE ankle clonus Plantars: Right: upgoing   Left: mute Cerebellar: Normal finger-to-nose testing bilaterally Gait: not tested due to safety concerns    Laboratory Studies:   Basic Metabolic Panel:  Recent Labs Lab 04/16/17 1815 04/17/17 0447 04/18/17 0541 04/19/17 0429 04/20/17 0521 04/20/17 1331 04/21/17 0715 04/21/17 1815  NA  --  124* 128* 130* 136  --  142  --   K  --  4.4 5.3* 4.6 3.6 3.6 2.8* 3.8  CL  --  92* 92* 99* 99*  --  101  --   CO2  --  _0 --  29  --   GLUCOSE  --  136* 133* 129* 137*  --  144*  --   BUN  --  20 45* 54* 42*  --  32*  --   CREATININE  --  1.30* 2.07* 1.47* 0.87  --  0.85  --   CALCIUM  --  8.1* 7.8* 8.5* 9.4  --  8.7*  --   MG 1.8 2.0 2.3  --   --  1.8 1.9  --   PHOS 4.9* 5.4* 11.4*  --   --  1.6* 2.8  --     Liver Function Tests:  Recent Labs Lab 04/16/17 0009  AST 36  ALT 21  ALKPHOS 80  BILITOT 0.6  PROT 7.8  ALBUMIN 4.4   No results for input(s): LIPASE, AMYLASE in the last 168 hours. No results for input(s): AMMONIA in the last 168 hours.  CBC:  Recent Labs Lab 04/16/17 0009 04/17/17 0447 04/18/17 0541 04/19/17 0429 04/20/17 0521  WBC 16.5* 32.2* 25.0* 24.2* 21.7*  NEUTROABS 12.8* 28.7*  --   --   --   HGB 15.7 12.5 12.6 12.3 13.7  HCT 44.9 36.4 37.7 36.1 39.6  MCV 90.9 92.4 94.4 94.4 93.0  PLT 389 350 339 250 374    Cardiac Enzymes:  Recent Labs Lab 04/16/17 0009  TROPONINI <0.03    BNP: Invalid input(s): POCBNP  CBG:  Recent Labs Lab 04/21/17 0009 04/21/17 0412 04/21/17 0742 04/21/17 1135 04/21/17 1559  GLUCAP 119* 141* 149* 147* 145*     Microbiology: Results for orders placed or performed during the hospital encounter of 04/15/17  MRSA PCR Screening     Status: None   Collection Time: 04/16/17 12:26 PM  Result Value Ref Range Status   MRSA by PCR NEGATIVE NEGATIVE Final    Comment:        The GeneXpert MRSA Assay (FDA approved for NASAL specimens only), is one component of a comprehensive MRSA colonization surveillance program. It is not intended to diagnose  MRSA infection nor to guide or monitor treatment for MRSA infections.   Culture, respiratory (NON-Expectorated)     Status: None   Collection Time: 04/18/17 11:11 AM  Result Value Ref Range Status   Specimen Description TRACHEAL ASPIRATE  Final   Special Requests NONE  Final   Gram Stain   Final    FEW WBC PRESENT, PREDOMINANTLY MONONUCLEAR RARE GRAM POSITIVE COCCI IN PAIRS AND CHAINS    Culture   Final    Consistent with normal respiratory flora. Performed at [facility], [place] [address] [place],  [address]    Report Status 04/21/2017 FINAL  Final    Coagulation Studies: No results for input(s): LABPROT, INR in the last 72 hours.  Urinalysis: No results for input(s): COLORURINE, LABSPEC, PHURINE, GLUCOSEU, HGBUR, BILIRUBINUR, KETONESUR, PROTEINUR, UROBILINOGEN, NITRITE, LEUKOCYTESUR in the last 168 hours.  Invalid input(s): APPERANCEUR  Lipid Panel:     Component Value Date/Time   TRIG 76 04/16/2017 1815    HgbA1C: No results found for: HGBA1C  Urine Drug Screen:  No results found for: LABOPIA, COCAINSCRNUR, LABBENZ, AMPHETMU, THCU, LABBARB  Alcohol Level: No results for input(s): ETH in the last 168 hours.  Other results: EKG: sinus tachycardia at 112 bpm.  Imaging: Dg Abd 1 View  Result Date: 04/19/2017 CLINICAL DATA:  61 year old female unable to hold her breath. Abdominal discomfort. Constipation. EXAM: ABDOMEN - 1 VIEW COMPARISON:  04/16/2017. FINDINGS: Gas and stool are noted throughout the colon and rectum. No  pathologic dilatation of small bowel. No pneumoperitoneum. IMPRESSION: 1. Nonspecific, nonobstructive bowel gas pattern. 2. No pneumoperitoneum. Electronically Signed   By: Vinnie Langton M.D.   On: 04/19/2017 22:19   Ct Head Wo Contrast  Result Date: 04/20/2017 CLINICAL DATA:  Acute encephalopathy EXAM: CT HEAD WITHOUT CONTRAST TECHNIQUE: Contiguous axial images were obtained from the base of the skull through the vertex without intravenous contrast. COMPARISON:  CT head 09/15/2006 FINDINGS: Brain: No evidence of acute infarction, hemorrhage, hydrocephalus, extra-axial collection or mass lesion/mass effect. Vascular: Negative for hyperdense vessel Skull: Negative Sinuses/Orbits: Negative Other: None IMPRESSION: Negative CT head Electronically Signed   By: Franchot Gallo M.D.   On: 04/20/2017 10:51   Mr Brain Wo Contrast  Result Date: 04/20/2017 CLINICAL DATA:  Shortness of breath for 1 week. Respiratory distress. Hypoxemia. Anxiety. Altered mental status. EXAM: MRI HEAD WITHOUT CONTRAST TECHNIQUE: Multiplanar, multiecho pulse sequences of the brain and surrounding structures were obtained without intravenous contrast. COMPARISON:  CT head 04/20/2017. FINDINGS: The patient was unable to remain motionless for the exam. Small or subtle lesions could be overlooked. Brain: No acute infarction, hemorrhage, hydrocephalus, extra-axial collection or mass lesion. Normal for age cerebral volume. No significant white matter disease. Vascular: Flow voids are maintained throughout the carotid, basilar, and vertebral arteries. There are no areas of chronic hemorrhage. Skull and upper cervical spine: Normal marrow signal. Suspected cervical spondylosis, possible spinal stenosis at C3-C4. No definite tonsillar herniation. Sinuses/Orbits: Negative. Other: None. Compared with earlier CT, good general agreement. IMPRESSION: Motion degraded exam demonstrating no definite acute intracranial abnormality. Electronically Signed    By: Staci Righter M.D.   On: 04/20/2017 13:22   Dg Chest Port 1 View  Result Date: 04/20/2017 CLINICAL DATA:  Respiratory failure. EXAM: PORTABLE CHEST 1 VIEW COMPARISON:  04/18/2017.  04/16/2017.  04/12/2017 .  CT 04/12/2017. FINDINGS: Interim extubation and removal of NG tube. Left IJ line in stable position. Heart size normal. Persistent bibasilar subsegmental atelectasis. Slight increase in interstitial markings  in the left mid lung field. Pneumonitis cannot be excluded. No pleural effusion or pneumothorax. Heart size stable. IMPRESSION: 1. Interim extubation removal of NG tube. Left IJ line stable position. 2. Persistent bibasilar subsegmental atelectasis. Slight increase in interstitial markings in the left mid lung field. Pneumonitis cannot be excluded. Electronically Signed   By: Marcello Moores  Register   On: 04/20/2017 06:47     Assessment/Plan: 61 year old female admitted with COPD exacerbation who after extubation has altered mental status.  MRI reviewed and shows no acute changes.  EEG reviewed and shows no evidence of epileptiform activity.  After review of lab work patient with multiple metabolic abnormalities.  Patient improving as metabolic abnormalities improve as well.  Suspect mental status is due to a metabolic encephalopathy.  Expect continued improvement.   No further neurologic intervention is recommended at this time.  If further questions arise, please call or page at that time.  Thank you for allowing neurology to participate in the care of this patient.   Alexis Goodell, MD Neurology [phone] 04/21/2017, 7:19 PM

## 2017-04-21 NOTE — Progress Notes (Signed)
PT Cancellation Note  Patient Details Name: Alicia Rivera MRN: [id number] DOB: 08/18/1956   Cancelled Treatment:    Reason Eval/Treat Not Completed: Other (comment).  Pt's most recent lab results note decreased potassium of 2.8 and per nursing staff pt still with decreased level of arousal.  Will hold therapy at this time and re-attempt PT eval at a later date/time.  Hendricks LimesEmily Alex Leahy, PT 04/21/17, 11:40 AM [phone]

## 2017-04-21 NOTE — Evaluation (Addendum)
Clinical/Bedside Swallow Evaluation Patient Details  Name: Alicia Rivera MRN: [id number] Date of Birth: 11/10/1955  Today's Date: 04/21/2017 Time: SLP Start Time (ACUTE ONLY): 1545 SLP Stop Time (ACUTE ONLY): 1630 SLP Time Calculation (min) (ACUTE ONLY): 45 min  Past Medical History:  Past Medical History:  Diagnosis Date  . COPD (chronic obstructive pulmonary disease) ([facility])   . HTN (hypertension)   . Tobacco abuse    Past Surgical History:  Past Surgical History:  Procedure Laterality Date  . none     HPI:   Pt is a 61 y/o female w/ with past medical history of COPD presents to the emergency department complaining of shortness of breath. The patient has had waxing and waning dyspnea over the last 2 weeks. [facility] during that time spent with the same complaint. They have treated her for COPD exacerbations and ruled out myocardial ischemia, PE and aortic dissection (although notably her thoracic aorta is borderline enlarged). Tonight the patient arrived with severe respiratory distress with hypoxemia which required BiPAP. Her blood pressure was dramatically elevated as well so she was started on a nitroglycerin drip. The patient improved and was actually alkalotic by blood gas by the time BiPAP was discontinued. She was placed on 2 L of oxygen via nasal cannula. However, she continued to complain of difficulty breathing. She also complained of intermittent chest pain during periods of tachypnea but she is quite aware that her anxiety exacerbates her perception of pain. Pt was orally intubated post admission, now extubated and tolerating [place] O2 support. Pt has been tolerating ice chips per MD w/ NSG.   Assessment / Plan / Recommendation Clinical Impression  Pt appears to present w/ no gross oropharyngeal phase dysphagia during po trials of thin liquids and purees today. Pt appears at reduced risk for aspiration w/ po's when following aspiration precautions.  Pt consumed po trials given w/ no immediate, overt s/s of aspiration; clear vocal quality followed trials when assessed, no decline in O2 sats w/ po trials. Oral phase appeared grossly wfl for bolus management and oral clearing b/t tirals. However, pt does appear easily fatigued and min increased respiratory effort w/ any exertion therefore rest breaks were given b/t trials to avoid SOB/WOB. Pt was assisted feeding d/t overall weakness. Educated pt and family on reducing distractions and talking during any oral intake at this time. Recommended soft, broken down foods for easier oral intake overall; education on diet consistencies.  Recommend a modified diet initially w/ ST f/u for trials to upgrade as pt's medical and respiratory status' continue to improve. NSG and Dietician updated.  SLP Visit Diagnosis: Dysphagia, oropharyngeal phase (R13.12)    Aspiration Risk   (reduced)    Diet Recommendation  Dysphagia diet level 1 (puree) w/ Thin liquids; aspiration precautions; feeding support  Medication Administration: Whole meds with puree (crush only if needed; able to)    Other  Recommendations Recommended Consults:  (Dietician f/u) Oral Care Recommendations: Oral care BID;Staff/trained caregiver to provide oral care   Follow up Recommendations  (TBD) at discharge     Frequency and Duration min 3x week  2 weeks for po trials to safely upgrade diet consistency       Prognosis Prognosis for Safe Diet Advancement: Good Barriers to Reach Goals:  (respiratory status baseline; fatigue)      Swallow Study   General Date of Onset: 04/15/17 Type of Study: Bedside Swallow Evaluation Previous Swallow Assessment: none Diet Prior to  this Study: NPO (since admission) Temperature Spikes Noted: No (wbc 21.7) Respiratory Status: Nasal cannula (1-3 liters) History of Recent Intubation: Yes Length of Intubations (days): 4 days Date extubated: 04/19/17 Behavior/Cognition: Cooperative;Pleasant  mood;Distractible;Requires cueing (awake; seemed min fatigued) Oral Cavity Assessment: Within Functional Limits (grossly) Oral Care Completed by SLP: Recent completion by staff Oral Cavity - Dentition: Edentulous Vision: Functional for self-feeding Self-Feeding Abilities: Needs assist;Needs set up;Total assist Patient Positioning: Upright in bed Baseline Vocal Quality: Low vocal intensity (min breathy - less support) Volitional Cough:  (fair - wfl) Volitional Swallow: Able to elicit    Oral/Motor/Sensory Function Overall Oral Motor/Sensory Function: Within functional limits (wfl for bolus management and clearing)   Ice Chips Ice chips: Within functional limits Other Comments: per NSG report; not given by SLP   Thin Liquid Thin Liquid: Within functional limits Presentation: Cup;Self Fed;Straw (assisted; 3 trials via straw; 2 trials via cup) Other Comments: pt had a delayed cough x1 much after po trials; unsure if related directly to the po trial. Pt tended to put head back after swallowing intermittently to aid breathing(?)    Nectar Thick Nectar Thick Liquid: Not tested   Honey Thick Honey Thick Liquid: Not tested   Puree Puree: Within functional limits Presentation: Spoon (fed; 3 trials) Other Comments: few trials given d/t visitors in room and fatigue   Solid   GO   Solid: Not tested Other Comments: solids not assessed d/t level of exertion and pt's fatigue factor at this time         Alicia Som, MS, CCC-SLP Alicia Rivera 04/21/2017,4:35 PM

## 2017-04-21 NOTE — Progress Notes (Signed)
Pharmacy Consult for electrolyte management   Pharmacy consulted for electrolyte management for 61 yo female ICU patient.   Plan:  Will replace K with KCl 10 meq iv x 4 and recheck K in PM.   Allergies  Allergen Reactions  . Sulfa Antibiotics    Patient Measurements: Height: 5\' 5"  (165.1 cm) Weight: 174 lb 6.1 oz (79.1 kg) IBW/kg (Calculated) : 57  Vital Signs: Temp: 98.1 F (36.7 C) (07/17 0600) Temp Source: Oral (07/17 0600) BP: 160/102 (07/17 0600) Pulse Rate: 104 (07/17 0600) Intake/Output from previous day: 07/16 0701 - 07/17 0700 In: 1003.6 [P.O.:240; I.V.:253.6; IV Piggyback:510] Out: 1450 [Urine:1450] Intake/Output from this shift: No intake/output data recorded.  Labs:  Recent Labs  04/19/17 0429 04/20/17 0521 04/20/17 1331 04/21/17 0715  WBC 24.2* 21.7*  --   --   HGB 12.3 13.7  --   --   HCT 36.1 39.6  --   --   PLT 250 374  --   --   CREATININE 1.47* 0.87  --  0.85  MG  --   --  1.8 1.9  PHOS  --   --  1.6* 2.8   Estimated Creatinine Clearance: 70.4 mL/min (by C-G formula based on SCr of 0.85 mg/dL).  Potassium (mmol/L)  Date Value  04/21/2017 2.8 (L)  07/04/2014 4.0   Calcium (mg/dL)  Date Value  16/10/960407/17/2018 8.7 (L)   Calcium, Total (mg/dL)  Date Value  54/09/811909/29/2015 8.8   Albumin (g/dL)  Date Value  14/78/295607/09/2017 4.4  07/04/2014 3.8   Sodium (mmol/L)  Date Value  04/21/2017 142  07/04/2014 135 (L)   Medical History: Past Medical History:  Diagnosis Date  . COPD (chronic obstructive pulmonary disease) ([facility])   . HTN (hypertension)   . Tobacco abuse     Pharmacy will continue to monitor and adjust per consult.   Luisa Harthristy, Aariel Ems D 04/21/2017,10:59 AM

## 2017-04-21 NOTE — Plan of Care (Signed)
Problem: SLP Dysphagia Goals Goal: Misc Dysphagia Goal Pt will safely tolerate po diet of least restrictive consistency w/ no overt s/s of aspiration noted by Staff/pt/family x3 sessions.    

## 2017-04-22 DIAGNOSIS — F17200 Nicotine dependence, unspecified, uncomplicated: Secondary | ICD-10-CM

## 2017-04-22 LAB — BASIC METABOLIC PANEL
Anion gap: 9 (ref 5–15)
BUN: 33 mg/dL — ABNORMAL HIGH (ref 6–20)
CHLORIDE: 104 mmol/L (ref 101–111)
CO2: 27 mmol/L (ref 22–32)
Calcium: 8.3 mg/dL — ABNORMAL LOW (ref 8.9–10.3)
Creatinine, Ser: 0.8 mg/dL (ref 0.44–1.00)
GFR calc non Af Amer: >60 mL/min (ref >60)
Glucose, Bld: 112 mg/dL — ABNORMAL HIGH (ref 65–99)
Potassium: 3.3 mmol/L — ABNORMAL LOW (ref 3.5–5.1)
Sodium: 140 mmol/L (ref 135–145)

## 2017-04-22 LAB — GLUCOSE, CAPILLARY
Glucose-Capillary: 113 mg/dL — ABNORMAL HIGH (ref 65–99)
Glucose-Capillary: 113 mg/dL — ABNORMAL HIGH (ref 65–99)

## 2017-04-22 MED ORDER — TIOTROPIUM BROMIDE MONOHYDRATE 18 MCG IN CAPS
18.0000 ug | ORAL_CAPSULE | Freq: Every morning | RESPIRATORY_TRACT | Status: DC
  Administered 2017-04-22: 18 ug via RESPIRATORY_TRACT
  Filled 2017-04-22: qty 5

## 2017-04-22 MED ORDER — MOMETASONE FURO-FORMOTEROL FUM 100-5 MCG/ACT IN AERO
2.0000 | INHALATION_SPRAY | Freq: Two times a day (BID) | RESPIRATORY_TRACT | Status: DC
  Administered 2017-04-22: 2 via RESPIRATORY_TRACT
  Filled 2017-04-22: qty 8.8

## 2017-04-22 MED ORDER — ENSURE ENLIVE PO LIQD
237.0000 mL | Freq: Two times a day (BID) | ORAL | Status: DC
  Administered 2017-04-23: 10:00:00 237 mL via ORAL

## 2017-04-22 MED ORDER — BUDESONIDE 0.5 MG/2ML IN SUSP
0.5000 mg | Freq: Two times a day (BID) | RESPIRATORY_TRACT | Status: DC
  Administered 2017-04-22 – 2017-04-23 (×2): 0.5 mg via RESPIRATORY_TRACT
  Filled 2017-04-22 (×2): qty 2

## 2017-04-22 MED ORDER — IPRATROPIUM-ALBUTEROL 0.5-2.5 (3) MG/3ML IN SOLN
3.0000 mL | Freq: Four times a day (QID) | RESPIRATORY_TRACT | Status: DC
  Administered 2017-04-22 – 2017-04-23 (×3): 3 mL via RESPIRATORY_TRACT
  Filled 2017-04-22 (×4): qty 3

## 2017-04-22 MED ORDER — DULOXETINE HCL 20 MG PO CPEP
40.0000 mg | ORAL_CAPSULE | ORAL | Status: DC
  Administered 2017-04-22 – 2017-04-23 (×2): 40 mg via ORAL
  Filled 2017-04-22 (×2): qty 2

## 2017-04-22 MED ORDER — POTASSIUM CHLORIDE CRYS ER 20 MEQ PO TBCR
40.0000 meq | EXTENDED_RELEASE_TABLET | Freq: Once | ORAL | Status: AC
  Administered 2017-04-22: 40 meq via ORAL
  Filled 2017-04-22: qty 2

## 2017-04-22 MED ORDER — ROFLUMILAST 500 MCG PO TABS
500.0000 ug | ORAL_TABLET | Freq: Every day | ORAL | Status: DC
  Administered 2017-04-22 – 2017-04-23 (×2): 500 ug via ORAL
  Filled 2017-04-22 (×2): qty 1

## 2017-04-22 NOTE — Progress Notes (Signed)
Allergy band in place

## 2017-04-22 NOTE — Progress Notes (Signed)
[facility] [place] [facility]   PATIENT NAME: Alicia Rivera    MR#:  [id number]  DATE OF BIRTH:  1956/05/13  SUBJECTIVE:   Mental status much improved and sitting on chair following commands.  PO intake remains poor. Family at bedside.   REVIEW OF SYSTEMS:    Review of Systems  Constitutional: Negative for chills and fever.  HENT: Negative for congestion and tinnitus.   Eyes: Negative for blurred vision and double vision.  Respiratory: Negative for cough, shortness of breath and wheezing.   Cardiovascular: Negative for chest pain, orthopnea and PND.  Gastrointestinal: Negative for abdominal pain, diarrhea, nausea and vomiting.  Genitourinary: Negative for dysuria and hematuria.  Neurological: Negative for dizziness, sensory change and focal weakness.  All other systems reviewed and are negative.   Nutrition: Regular.  Tolerating Diet: Yes Tolerating PT: Await Eval.   DRUG ALLERGIES:   Allergies  Allergen Reactions  . Sulfa Antibiotics     VITALS:  Blood pressure 126/73, pulse 87, temperature 98.7 F (37.1 C), resp. rate 18, height 5\' 5"  (1.651 m), weight 83.6 kg (184 lb 6.4 oz), SpO2 97 %.  PHYSICAL EXAMINATION:   Physical Exam  GENERAL:  61 y.o.-year-old patient Sitting up in chair in no apparent distress.  EYES: Pupils equal, round, reactive to light and accommodation. No scleral icterus. Extraocular muscles intact.  HEENT: Head atraumatic, normocephalic. Oropharynx and nasopharynx clear.  NECK:  Supple, no jugular venous distention. No thyroid enlargement, no tenderness.  LUNGS: Lungs inspiratory and expiratory phase, minimal expiratory wheezing bilaterally, no rales, rhonchi. No use of accessory muscles of respiration.  CARDIOVASCULAR: S1, S2 normal. No murmurs, rubs, or gallops.  ABDOMEN: Soft, nontender, nondistended. Bowel sounds present. No organomegaly or mass.  EXTREMITIES: No cyanosis, clubbing or edema b/l.    NEUROLOGIC: Cranial  nerves II through XII are intact. No focal Motor or sensory deficits b/l.  Globally weak.  PSYCHIATRIC: The patient is alert and oriented x 3.  SKIN: No obvious rash, lesion, or ulcer.    LABORATORY PANEL:   CBC  Recent Labs Lab 04/20/17 0521  WBC 21.7*  HGB 13.7  HCT 39.6  PLT 374   ------------------------------------------------------------------------------------------------------------------  Chemistries   Recent Labs Lab 04/16/17 0009  04/21/17 0715  04/22/17 0423  NA 123*  < > 142  --  140  K 3.8  < > 2.8*  < > 3.3*  CL 86*  < > 101  --  104  CO2 22  < > 29  --  27  GLUCOSE 136*  < > 144*  --  112*  BUN 7  < > 32*  --  33*  CREATININE 0.74  < > 0.85  --  0.80  CALCIUM 9.5  < > 8.7*  --  8.3*  MG 1.8  < > 1.9  --   --   AST 36  --   --   --   --   ALT 21  --   --   --   --   ALKPHOS 80  --   --   --   --   BILITOT 0.6  --   --   --   --   < > = values in this interval not displayed. ------------------------------------------------------------------------------------------------------------------  Cardiac Enzymes  Recent Labs Lab 04/16/17 0009  TROPONINI <0.03   ------------------------------------------------------------------------------------------------------------------  RADIOLOGY:  No results found.   ASSESSMENT AND PLAN:   61 year old female with past medical history of COPD,  hypertension, who presented to the hospital due to shortness of breath and noted to be in acute respiratory failure with hypoxia secondary to COPD exacerbation.  1. COPD exacerbation-this is the cause of patient's acute respiratory failure with hypoxia -Respiratory status stable and extubated 2 days ago.  - continue scheduled DuoNeb's, Pulmicort nebs, taken off IV steroids as per Pulm.  - chest x-ray negative for acute pneumonia. Continue empiric doxycycline. Appreciate pulmonary input.   2. Altered mental status/encephalopathy-etiology unclear presently. CT head, MRI  of the brain is negative for acute pathology.  -EEG also negative for any acute seizures. Mental status much improved now and following commands.  - appreciate Neuro input.   3. 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  4. Hypokalemia- Improving with supplementation and will cont. To monitor.    5. GERD-continue Protonix.  6. Essential hypertension-continue clonidine patch, as needed IV hydralazine.  Transfer to floor and Await PT eval.   All the records are reviewed and case discussed with Care Management/Social Worker. Management plans discussed with the patient, family and they are in agreement.  CODE STATUS: Full code  DVT Prophylaxis: Hep. SQ  TOTAL TIME TAKING CARE OF THIS PATIENT: 30 minutes.   POSSIBLE D/C 2-3 days, DEPENDING ON CLINICAL CONDITION.   Houston SirenSAINANI,VIVEK J M.D on 04/22/2017 at 3:27 PM  Between 7am to 6pm - Pager - [phone]  After 6pm go to [email or web address] - Therapist, nutritionalpassword EPAS [facility]  [facility]  Office  [phone]  CC: Primary care physician; Jenne PaneLlc, [facility]

## 2017-04-22 NOTE — Progress Notes (Signed)
Nutrition Follow-up  DOCUMENTATION CODES:   Obesity unspecified  INTERVENTION:  1. Provide Ensure Enlive po BID, each supplement provides 350 kcal and 20 grams of protein 2. Kozy Shack pudding BID ordered.  NUTRITION DIAGNOSIS:   Inadequate oral intake related to other (see comment) (does not like food) as evidenced by per patient/family report. -new dx GOAL:   Patient will meet greater than or equal to 90% of their needs -not meeting  MONITOR:   PO intake, Supplement acceptance, I & O's, Labs  REASON FOR ASSESSMENT:   Consult Enteral/tube feeding initiation and management  ASSESSMENT:   61 yo female with a PMH of Tobacco Abuse, HTN, and COPD.  She presented to [facility] ER 07/11 with c/o worsening shortness of breath over the last 2 weeks.  Extubated, OGT removed, AMS improving Patient was lethargic, did receive a tray this morning but does not like the food. Wanted to drink ensure. Denies nausea, pain. Nutrition-Focused physical exam completed. Findings are no fat depletion, no muscle depletion, and mild edema.  Labs reviewed: CBGs 113, 113; K+ 3.3,  Medications reviewed. UOP 1685 last 24 hrs  Diet Order:  DIET - DYS 1 Room service appropriate? Yes with Assist; Fluid consistency: Thin  Skin:  Reviewed, no issues  Last BM:  04/21/2017 (Type 7)  Height:   Ht Readings from Last 1 Encounters:  04/16/17 5\' 5"  (1.651 m)    Weight:   Wt Readings from Last 1 Encounters:  04/21/17 174 lb 6.1 oz (79.1 kg)    Ideal Body Weight:  56.8 kg  BMI:  Body mass index is 29.02 kg/m.  Estimated Nutritional Needs:   Kcal:  1600-1900 calories  Protein:  86-104g/day   Fluid:  >/= 1.6L  EDUCATION NEEDS:   No education needs identified at this time  Dionne AnoWilliam M. Nevae Pinnix, MS, RD LDN Inpatient Clinical Dietitian Pager [phone]

## 2017-04-22 NOTE — Progress Notes (Signed)
  Speech Language Pathology Treatment: Dysphagia  Patient Details Name: Alicia Rivera MRN: [id number] DOB: 11/25/1955 Today's Date: 04/22/2017 Time: [id number] SLP Time Calculation (min) (ACUTE ONLY): 40 min  Assessment / Plan / Recommendation Clinical Impression  Pt seen for toleration of current diet and trials to upgrade diet consistency. Pt sitting in chair at bedside visiting w/ Sons; pt appeared more alert and verbally conversive. She seemed less fatigued.  Pt consumed po trials of graham crackers(moistened somewhat w/ applesauce) w/ no overt s/s of aspiration noted; no oral phase deficits noted. Pt appeared to manage boluses w/ adequate gumming and mastication(missing dentition) and cleared appropriately b/t trials. Pt consumed trials of thin liquids via straw w/out deficits.  Pt requested diet upgrade and Sons wanted to bring pt's favorite foods in this PM. Educated pt and Sons on general aspiration precautions including small single bites/sips more slowly to avoid increased WOB/SOB which could impact timing of the swallowing - discussed general aspiration precautions; dysphagia d/t respiratory issues. Will provide handout on eating w/ COPD. ST services will f/u as needed while pt is admitted. Sons agreed. NSG updated.    HPI HPI: Pt is a 61 y/o female w/ with past medical history of COPD presents to the emergency department complaining of shortness of breath. The patient has had waxing and waning dyspnea over the last 2 weeks. She has been [facility] times during that time spent with the same complaint. They have treated her for COPD exacerbations and ruled out myocardial ischemia, PE and aortic dissection (although notably her thoracic aorta is borderline enlarged). Tonight the patient arrived with severe respiratory distress with hypoxemia which required BiPAP. Her blood pressure was dramatically elevated as well so she was started on a nitroglycerin drip. The patient improved  and was actually alkalotic by blood gas by the time BiPAP was discontinued. She was placed on 2 L of oxygen via nasal cannula. However, she continued to complain of difficulty breathing. She also complained of intermittent chest pain during periods of tachypnea but she is quite aware that her anxiety exacerbates her perception of pain. Pt was orally intubated post admission, now extubated and tolerating [place] O2 support. Pt has been tolerating thin liquids, current diet per NSG.      SLP Plan  Continue with current plan of care - f/u w/ any further education on aspiration precautions as needed while admitted       Recommendations  Diet recommendations: Dysphagia 3 (mechanical soft);Regular;Thin liquid Liquids provided via: Cup;Straw Medication Administration: Whole meds with puree (but in liquids if tolerates per NSG) Supervision: Patient able to self feed;Staff to assist with self feeding;Intermittent supervision to cue for compensatory strategies (as needed) Compensations: Minimize environmental distractions;Slow rate;Small sips/bites;Lingual sweep for clearance of pocketing;Follow solids with liquid Postural Changes and/or Swallow Maneuvers: Seated upright 90 degrees;Upright 30-60 min after meal                General recommendations:  (Dietician f/u) Oral Care Recommendations: Oral care BID;Staff/trained caregiver to provide oral care;Patient independent with oral care Follow up Recommendations: None SLP Visit Diagnosis: Dysphagia, oropharyngeal phase (R13.12) Plan: Continue with current plan of care       GO               Alicia SomKatherine Watson, MS, CCC-SLP Rivera,Alicia 04/22/2017, 3:45 PM

## 2017-04-22 NOTE — Plan of Care (Signed)
Problem: Education: Goal: Knowledge of  General Education information/materials will improve Outcome: Progressing More alert and oriented today, beginning to recall  Problem: Activity: Goal: Risk for activity intolerance will decrease Outcome: Progressing Up with PT to chair  Problem: Nutrition: Goal: Adequate nutrition will be maintained Outcome: Progressing Advancing diet today

## 2017-04-22 NOTE — Evaluation (Signed)
Physical Therapy Evaluation Patient Details Name: Alicia Rivera MRN: [id number] DOB: 11/02/1955 Today's Date: 04/22/2017   History of Present Illness  Pt is a 61 y.o. female presenting to hospital with SOB since July 4th (admitted 7/11) and pt with severe respiratory distress and also increased anxiety noted.  Pt with recent hospital visits per chart.  Pt admitted to hospital with acute on chronic respiratory failure with hypoxemia (requiring BiPap), htn, and acute COPD exacerbation.  Pt intubated 04/16/17; extubated 04/19/17 with lethargy and AMS (anticipate d/t metabolic encephalopathy; imaging negative for acute event).  PMH includes COPD (not on home O2).  Clinical Impression  Prior to hospital admission, pt was independent ambulating without AD.  Pt lives alone in 1 level home with stairs to enter.  Currently pt is mod assist supine to sit, min to mod assist to stand, and min assist to take a few steps bed to recliner using RW.  Pt demonstrating posterior lean with standing and taking steps to chair requiring assist to shift weight forward.  Mobility limited during session d/t SOB and fatigue and requiring rest breaks.  Pt would benefit from skilled PT to address noted impairments and functional limitations (see below for any additional details).  Upon hospital discharge, recommend pt discharge to STR.    Follow Up Recommendations SNF    Equipment Recommendations  Rolling Rody with 5" wheels    Recommendations for Other Services OT consult     Precautions / Restrictions Precautions Precautions: Fall Precaution Comments: Aspiration Restrictions Weight Bearing Restrictions: No      Mobility  Bed Mobility Overal bed mobility: Needs Assistance Bed Mobility: Supine to Sit     Supine to sit: Mod assist;HOB elevated     General bed mobility comments: assist for trunk; increased time to perform; vc's for technique required  Transfers Overall transfer level: Needs  assistance Equipment used: Rolling Ziehm (2 wheeled) Transfers: Sit to/from Stand Sit to Stand: Min assist;Mod assist         General transfer comment: assist to initiate and come to full stand; vc's for hand and feet placement required; posterior lean upon standing noted requiring assist and cueing  Ambulation/Gait Ambulation/Gait assistance: Min assist Ambulation Distance (Feet): 3 Feet (bed to recliner) Assistive device: Rolling Wenk (2 wheeled)   Gait velocity: decreased   General Gait Details: decreased B step length/foot clearance/heelstrike; posterior lean noted requiring vc's and assist to shift weight forward; limited d/t SOB and fatigue  Stairs            Wheelchair Mobility    Modified Rankin (Stroke Patients Only)       Balance Overall balance assessment: Needs assistance Sitting-balance support: Bilateral upper extremity supported;Feet supported Sitting balance-Leahy Scale: Fair Sitting balance - Comments: static sitting   Standing balance support: Bilateral upper extremity supported (on RW) Standing balance-Leahy Scale: Poor Standing balance comment: posterior lean initially upon standing requiring vc's and assist to correct                             Pertinent Vitals/Pain Pain Assessment: No/denies pain     Home Living Family/patient expects to be discharged to:: Private residence Living Arrangements: Alone Available Help at Discharge: Family Type of Home: House Home Access: Stairs to enter Entrance Stairs-Rails: None Entrance Stairs-Number of Steps: 2 Home Layout: One level Home Equipment: None      Prior Function Level of Independence: Independent  Comments: Pt driving and independent with ADL's.  Denies any falls in past 6 months.     Hand Dominance        Extremity/Trunk Assessment   Upper Extremity Assessment Upper Extremity Assessment: Generalized weakness    Lower Extremity Assessment Lower  Extremity Assessment: Generalized weakness    Cervical / Trunk Assessment Cervical / Trunk Assessment: Normal  Communication   Communication: No difficulties  Cognition Arousal/Alertness: Awake/alert Behavior During Therapy: WFL for tasks assessed/performed Overall Cognitive Status: Within Functional Limits for tasks assessed                                        General Comments   Nursing cleared pt for participation in physical therapy.  Pt agreeable to PT session.    Exercises   Mobility training   Assessment/Plan    PT Assessment Patient needs continued PT services  PT Problem List Decreased strength;Decreased activity tolerance;Decreased balance;Decreased mobility;Decreased knowledge of use of DME;Decreased knowledge of precautions;Cardiopulmonary status limiting activity       PT Treatment Interventions DME instruction;Gait training;Stair training;Functional mobility training;Therapeutic activities;Therapeutic exercise;Balance training;Patient/family education    PT Goals (Current goals can be found in the Care Plan section)  Acute Rehab PT Goals Patient Stated Goal: to be able to walk again PT Goal Formulation: With patient Time For Goal Achievement: 05/06/17 Potential to Achieve Goals: Good    Frequency Min 2X/week   Barriers to discharge Decreased caregiver support      Co-evaluation               AM-PAC PT "6 Clicks" Daily Activity  Outcome Measure Difficulty turning over in bed (including adjusting bedclothes, sheets and blankets)?: Total Difficulty moving from lying on back to sitting on the side of the bed? : Total Difficulty sitting down on and standing up from a chair with arms (e.g., wheelchair, bedside commode, etc,.)?: Total Help needed moving to and from a bed to chair (including a wheelchair)?: A Lot Help needed walking in hospital room?: A Lot Help needed climbing 3-5 steps with a railing? : Total 6 Click Score: 8     End of Session Equipment Utilized During Treatment: Gait belt;Oxygen (1 L O2 via nasal cannula) Activity Tolerance: Patient limited by fatigue;Other (comment) (Limited d/t SOB) Patient left: in chair;with call bell/phone within reach;with family/visitor present (chair pad alarm in chair (no alarm in room)) Nurse Communication: Mobility status;Precautions PT Visit Diagnosis: Unsteadiness on feet (R26.81);Other abnormalities of gait and mobility (R26.89);Muscle weakness (generalized) (M62.81)    Time: 0940-1010 PT Time Calculation (min) (ACUTE ONLY): 30 min   Charges:   PT Evaluation $PT Eval Low Complexity: 1 Procedure PT Treatments $Therapeutic Activity: 8-22 mins   PT G CodesHendricks Limes, PT 04/22/17, 5:37 PM [phone]

## 2017-04-22 NOTE — Progress Notes (Addendum)
   Name: Alicia Rivera MRN: [id number] DOB: 04/20/1956    ADMISSION DATE:  04/15/2017 CONSULTATION DATE: 04/16/2017  REFERRING MD : Dr. Elpidio AnisSudini  CHIEF COMPLAINT: Shortness of Breath   BRIEF PATIENT DESCRIPTION:  61 yo female admitted 07/11 with acute on chronic hypoxic respiratory failure secondary to AECOPD requiring Bipap.  Failed BiPAP and was intubated on 7/12  MAJOR EVENTS/TEST RESULTS: 07/11-Pt admitted to the telemetry unit 07/12-Pt transferred to ICU  07/12 Intubated for severe respiratory distress 07/14 Too sedated to extubated. Changed to propofol infusion 07/15 Passed SBT and extubated. Hypoxemia post extubation > received naloxone with improvement in LOC and cough. Copious mucopurulent sputum. Received Lasix X 1 dose. PRN BiPAP initiated 07/16 Remains lethargic, high risk for intubation -naloxone infusion administered 07/17 off of naloxone. Remains mildly lethargic but responsive and follows commands. No respiratory distress 07/18 much improved cognition. No respiratory distress. Transfer order to MedSurg floor placed  INDWELLING DEVICES:: ETT 07/12 >> 07/15 L IJ CVL 07/12 >>   MICRO DATA: MRSA PCR 07/12 >> NEG Resp 07/14 >>   ANTIMICROBIALS:  Azithromycin 07/12 >> 07/13 Doxycycline 07/14 >> 07/19   STUDIES:     SUBJ: Lethargy is much improved. No new complaints. No respiratory distress   VITAL SIGNS: Temp:  [97.8 F (36.6 C)-98.4 F (36.9 C)] 98 F (36.7 C) (07/18 0800) Resp:  [17-28] 28 (07/18 1100) BP: (128-161)/(71-108) 147/86 (07/18 1100) SpO2:  [93 %-100 %] 100 % (07/18 1100) FiO2 (%):  [35 %] 35 % (07/18 0204)  PHYSICAL EXAMINATION: General: No distress Neuro: RASS -0, follows commands, no focal deficits HEENT: NCAT, sclerae white, OP normal Cardiovascular: Reg, no M Lungs: No wheezes Abdomen: Soft, +BS  Ext: warm, no edema   Recent Labs Lab 04/20/17 0521  04/21/17 0715 04/21/17 1815 04/22/17 0423  NA 136  --  142  --  140  K 3.6  <  > 2.8* 3.8 3.3*  CL 99*  --  101  --  104  CO2 27  --  29  --  27  BUN 42*  --  32*  --  33*  CREATININE 0.87  --  0.85  --  0.80  GLUCOSE 137*  --  144*  --  112*  < > = values in this interval not displayed.  Recent Labs Lab 04/18/17 0541 04/19/17 0429 04/20/17 0521  HGB 12.6 12.3 13.7  HCT 37.7 36.1 39.6  WBC 25.0* 24.2* 21.7*  PLT 339 250 374   CXR: No new film  ASSESSMENT / PLAN: Acute hypoxic/hypercarbic respiratory failure  AECOPD, resolving Current smoker Hyponatremia, resolved AKI, resolved Hypoactive delirium, Much improved  PLAN: Continue supplemental O2 to maintain SpO2 > 88% Transition back to her previous regimen of Symbicort (Dulera while in hospital), Spiriva Cont nebulized albuterol as needed Advance diet and activity Transfer to MedSurg floor She will likely need SNF Rehab I have counseled regarding need for smoking cessation After transfer, PCCM will sign off. Please call if we can be of further assistance  She tells me she has been seen by Dr Welton FlakesKhan in the past - I can find no record of this but if true, she should follow up with him after discharge. Otherwise, she can be seen in our pulmonary clinic in 3-4 weeks after discharge   Billy Fischeravid Amando Chaput, MD PCCM service Mobile [phone] Pager [phone] 04/22/2017 2:07 PM

## 2017-04-22 NOTE — Progress Notes (Signed)
Subjective: Patient more awake today and communicative.    Objective: Current vital signs: BP (!) 147/86   Pulse (!) 104   Temp 98 F (36.7 C) (Oral)   Resp (!) 28   Ht 5\' 5"  (1.651 m)   Wt 79.1 kg (174 lb 6.1 oz)   SpO2 100%   BMI 29.02 kg/m  Vital signs in last 24 hours: Temp:  [97.8 F (36.6 C)-98.4 F (36.9 C)] 98 F (36.7 C) (07/18 0800) Resp:  [17-28] 28 (07/18 1100) BP: (128-161)/(71-108) 147/86 (07/18 1100) SpO2:  [93 %-100 %] 100 % (07/18 1100) FiO2 (%):  [35 %] 35 % (07/18 0204)  Intake/Output from previous day: 07/17 0701 - 07/18 0700 In: 965 [I.V.:965] Out: 1685 [Urine:1685] Intake/Output this shift: Total I/O In: 660 [P.O.:660] Out: 300 [Urine:300] Nutritional status: DIET - DYS 1 Room service appropriate? Yes with Assist; Fluid consistency: Thin  Neurologic Exam: Mental Status: Awake and alert.  Oriented to the fact that she is in the hospital, although she reports it is [place].  Knows the year and month.  Able to follow 3 step commands. Cranial Nerves: II: Discs flat bilaterally; Blinks to bilateral confrontation, pupils equal, round, reactive to light and accommodation III,IV, VI: ptosis not present, extra-ocular motions intact bilaterally V,VII: smile symmetric, facial light touch sensation normal bilaterally VIII: hearing normal bilaterally IX,X: gag reflex present XI: bilateral shoulder shrug XII: midline tongue extension Motor: Lifts all extremities against gravity with no focal weakness noted.  Sensory: Responds to light noxious stimuli throughout   Lab Results: Basic Metabolic Panel:  Recent Labs Lab 04/16/17 1815 04/17/17 0447 04/18/17 0541 04/19/17 0429 04/20/17 0521 04/20/17 1331 04/21/17 0715 04/21/17 1815 04/22/17 0423  NA  --  124* 128* 130* 136  --  142  --  140  K  --  4.4 5.3* 4.6 3.6 3.6 2.8* 3.8 3.3*  CL  --  92* 92* 99* 99*  --  101  --  104  CO2  --  25 22 25 27   --  29  --  27  GLUCOSE  --  136* 133* 129* 137*  --   144*  --  112*  BUN  --  20 45* 54* 42*  --  32*  --  33*  CREATININE  --  1.30* 2.07* 1.47* 0.87  --  0.85  --  0.80  CALCIUM  --  8.1* 7.8* 8.5* 9.4  --  8.7*  --  8.3*  MG 1.8 2.0 2.3  --   --  1.8 1.9  --   --   PHOS 4.9* 5.4* 11.4*  --   --  1.6* 2.8  --   --     Liver Function Tests:  Recent Labs Lab 04/16/17 0009  AST 36  ALT 21  ALKPHOS 80  BILITOT 0.6  PROT 7.8  ALBUMIN 4.4   No results for input(s): LIPASE, AMYLASE in the last 168 hours. No results for input(s): AMMONIA in the last 168 hours.  CBC:  Recent Labs Lab 04/16/17 0009 04/17/17 0447 04/18/17 0541 04/19/17 0429 04/20/17 0521  WBC 16.5* 32.2* 25.0* 24.2* 21.7*  NEUTROABS 12.8* 28.7*  --   --   --   HGB 15.7 12.5 12.6 12.3 13.7  HCT 44.9 36.4 37.7 36.1 39.6  MCV 90.9 92.4 94.4 94.4 93.0  PLT 389 350 339 250 374    Cardiac Enzymes:  Recent Labs Lab 04/16/17 0009  TROPONINI <0.03    Lipid Panel:  Recent  Labs Lab 04/16/17 1815  TRIG 76    CBG:  Recent Labs Lab 04/21/17 1559 04/21/17 1947 04/21/17 2336 04/22/17 0356 04/22/17 0710  GLUCAP 145* 123* 143* 113* 113*    Microbiology: Results for orders placed or performed during the hospital encounter of 04/15/17  MRSA PCR Screening     Status: None   Collection Time: 04/16/17 12:26 PM  Result Value Ref Range Status   MRSA by PCR NEGATIVE NEGATIVE Final    Comment:        The GeneXpert MRSA Assay (FDA approved for NASAL specimens only), is one component of a comprehensive MRSA colonization surveillance program. It is not intended to diagnose MRSA infection nor to guide or monitor treatment for MRSA infections.   Culture, respiratory (NON-Expectorated)     Status: None   Collection Time: 04/18/17 11:11 AM  Result Value Ref Range Status   Specimen Description TRACHEAL ASPIRATE  Final   Special Requests NONE  Final   Gram Stain   Final    FEW WBC PRESENT, PREDOMINANTLY MONONUCLEAR RARE GRAM POSITIVE COCCI IN PAIRS AND  CHAINS    Culture   Final    Consistent with normal respiratory flora. Performed at [facility], [address], [place], [place] [address]    Report Status 04/21/2017 FINAL  Final    Coagulation Studies: No results for input(s): LABPROT, INR in the last 72 hours.  Imaging: Mr Brain Wo Contrast  Result Date: 04/20/2017 CLINICAL DATA:  Shortness of breath for 1 week. Respiratory distress. Hypoxemia. Anxiety. Altered mental status. EXAM: MRI HEAD WITHOUT CONTRAST TECHNIQUE: Multiplanar, multiecho pulse sequences of the brain and surrounding structures were obtained without intravenous contrast. COMPARISON:  CT head 04/20/2017. FINDINGS: The patient was unable to remain motionless for the exam. Small or subtle lesions could be overlooked. Brain: No acute infarction, hemorrhage, hydrocephalus, extra-axial collection or mass lesion. Normal for age cerebral volume. No significant white matter disease. Vascular: Flow voids are maintained throughout the carotid, basilar, and vertebral arteries. There are no areas of chronic hemorrhage. Skull and upper cervical spine: Normal marrow signal. Suspected cervical spondylosis, possible spinal stenosis at C3-C4. No definite tonsillar herniation. Sinuses/Orbits: Negative. Other: None. Compared with earlier CT, good general agreement. IMPRESSION: Motion degraded exam demonstrating no definite acute intracranial abnormality. Electronically Signed   By: Elsie Stain M.D.   On: 04/20/2017 13:22    Medications:  I have reviewed the patient's current medications. Scheduled: . cloNIDine  0.1 mg Transdermal Weekly  . DULoxetine  40 mg Oral BH-q7a  . feeding supplement (ENSURE ENLIVE)  237 mL Oral BID BM  . heparin subcutaneous  5,000 Units Subcutaneous Q8H  . mometasone-formoterol  2 puff Inhalation BID  . roflumilast  500 mcg Oral Daily  . tiotropium  18 mcg Inhalation q morning - 10a    Assessment/Plan: Patient continues to improve and near baseline.   No further neurologic intervention is recommended at this time.  If further questions arise, please call or page at that time.  Thank you for allowing neurology to participate in the care of this patient.    LOS: 6 days   Thana Farr, MD Neurology [phone] 04/22/2017  11:30 AM

## 2017-04-23 LAB — BASIC METABOLIC PANEL
ANION GAP: 8 (ref 5–15)
BUN: 25 mg/dL — ABNORMAL HIGH (ref 6–20)
CALCIUM: 8.5 mg/dL — AB (ref 8.9–10.3)
CHLORIDE: 105 mmol/L (ref 101–111)
CO2: 27 mmol/L (ref 22–32)
Creatinine, Ser: 0.68 mg/dL (ref 0.44–1.00)
GFR calc non Af Amer: >60 mL/min (ref >60)
GLUCOSE: 103 mg/dL — AB (ref 65–99)
POTASSIUM: 3.1 mmol/L — AB (ref 3.5–5.1)
Sodium: 140 mmol/L (ref 135–145)

## 2017-04-23 MED ORDER — IPRATROPIUM-ALBUTEROL 0.5-2.5 (3) MG/3ML IN SOLN: 3.0000 mL | Freq: Four times a day (QID) | RESPIRATORY_TRACT | 0 refills | Status: DC | PRN

## 2017-04-23 MED ORDER — ZINC OXIDE 40 % EX OINT
TOPICAL_OINTMENT | CUTANEOUS | Status: DC | PRN
  Filled 2017-04-23: qty 114

## 2017-04-23 MED ORDER — SYMBICORT 160-4.5 MCG/ACT IN AERO: 1.0000 | INHALATION_SPRAY | Freq: Two times a day (BID) | RESPIRATORY_TRACT | 1 refills | Status: DC

## 2017-04-23 MED ORDER — BUPROPION HCL ER (XL) 150 MG PO TB24: 150.0000 mg | ORAL_TABLET | Freq: Two times a day (BID) | ORAL | 1 refills | Status: DC

## 2017-04-23 MED ORDER — SPIRIVA HANDIHALER 18 MCG IN CAPS: 1.0000 | ORAL_CAPSULE | Freq: Every day | RESPIRATORY_TRACT | 1 refills | Status: DC

## 2017-04-23 MED ORDER — LORAZEPAM 1 MG PO TABS: 1.0000 mg | ORAL_TABLET | Freq: Every day | ORAL | 0 refills | Status: DC | PRN

## 2017-04-23 NOTE — Care Management Important Message (Signed)
Important Message  Patient Details  Name: Alicia Rivera MRN: [id number] Date of Birth: 01/08/1956   Medicare Important Message Given:  Yes    Gwenette GreetBrenda S Zeriyah Wain, RN 04/23/2017, 8:14 AM

## 2017-04-23 NOTE — Progress Notes (Signed)
Physical Therapy Treatment Patient Details Name: Alicia Rivera MRN: [id number] DOB: 06/29/1956 Today's Date: 04/23/2017    History of Present Illness Pt is a 61 y.o. female presenting to hospital with SOB since July 4th (admitted 7/11) and pt with severe respiratory distress and also increased anxiety noted.  Pt with recent hospital visits per chart.  Pt admitted to hospital with acute on chronic respiratory failure with hypoxemia (requiring BiPap), htn, and acute COPD exacerbation.  Pt intubated 04/16/17; extubated 04/19/17 with lethargy and AMS (anticipate d/t metabolic encephalopathy; imaging negative for acute event).  PMH includes COPD (not on home O2).    PT Comments    Pt in chair with daughter.  Requesting bedside commode.  Stand pivot to bedside commode with min a x 1.  Returned to chair with Velie and min a x 1.  Daughter in attendance for transfers and stated she would not need a Schartz "There will be seven of us there"  Educated on using [place] for pt safety and increasing independence.  After commode, she was able to complete exercises as describe below.  She fatigues easily and required seated rest after each set.  She needed mod verbal and tactile cues each sit/stand attempt for proper hand placements.  Balance deficits and general weakness limit mobility and increase fall risk.  Pt stated she would not be going to SNF but would go home with her daughter.  Daughter/pt stated she has good family support and that "I just want to go home to my dogs"  Pt's daughter left room halfway during session to get breakfast and discussion over discharge plan was left unfinished.  If pt continues to decline SNF/Rehab she will benefit from a rolling [place], HHPT, assist with all mobility and a  3-in-1 commode.  She stated her daughter has a ramp in and out of her home.  She does not have a wheelchair at this time but will have difficulty getting in/out of home and navigating without one until strength  increases.   Follow Up Recommendations  SNF     Equipment Recommendations  Rolling [place] with 5" wheels    Recommendations for Other Services       Precautions / Restrictions Precautions Precautions: Fall Precaution Comments: Aspiration Restrictions Weight Bearing Restrictions: No    Mobility  Bed Mobility               General bed mobility comments: in chair upon arrival  Transfers Overall transfer level: Needs assistance Equipment used: Rolling Taniguchi (2 wheeled) Transfers: Sit to/from Stand Sit to Stand: Min assist            Ambulation/Gait Ambulation/Gait assistance: Min assist Ambulation Distance (Feet): 3 Feet Assistive device: Rolling Sider (2 wheeled)     Gait velocity interpretation: <1.8 ft/sec, indicative of risk for recurrent falls General Gait Details: decreased B step length/foot clearance/heelstrike; posterior lean noted requiring vc's and assist to shift weight forward; limited d/t SOB and fatigue   Stairs            Wheelchair Mobility    Modified Rankin (Stroke Patients Only)       Balance Overall balance assessment: Needs assistance Sitting-balance support: Bilateral upper extremity supported;Feet supported Sitting balance-Leahy Scale: Fair Sitting balance - Comments: static sitting   Standing balance support: Bilateral upper extremity supported Standing balance-Leahy Scale: Poor Standing balance comment: somewhat improved today but still requires +1 assist for all standing and transfer activities.  Cognition Arousal/Alertness: Awake/alert Behavior During Therapy: WFL for tasks assessed/performed Overall Cognitive Status: Within Functional Limits for tasks assessed                                        Exercises General Exercises - Lower Extremity Ankle Circles/Pumps: AROM;Seated;Both;10 reps Long Arc Quad: AROM;Seated;Both;10 reps Straight Leg Raises:  AROM;Both;Standing;10 reps Hip Flexion/Marching: AROM;Both;Standing;10 reps;Seated Heel Raises: AROM;Both;Standing;10 reps Other Exercises Other Exercises: to and from bedside commode with and without rolling [place].  Indepenant self care in sitting.      General Comments        Pertinent Vitals/Pain Pain Assessment: No/denies pain    Home Living                      Prior Function            PT Goals (current goals can now be found in the care plan section) Progress towards PT goals: Progressing toward goals    Frequency    Min 2X/week      PT Plan Current plan remains appropriate    Co-evaluation              AM-PAC PT "6 Clicks" Daily Activity  Outcome Measure  Difficulty turning over in bed (including adjusting bedclothes, sheets and blankets)?: Total Difficulty moving from lying on back to sitting on the side of the bed? : Total Difficulty sitting down on and standing up from a chair with arms (e.g., wheelchair, bedside commode, etc,.)?: Total Help needed moving to and from a bed to chair (including a wheelchair)?: A Lot Help needed walking in hospital room?: A Lot Help needed climbing 3-5 steps with a railing? : Total 6 Click Score: 8    End of Session Equipment Utilized During Treatment: Gait belt;Oxygen Activity Tolerance: Patient limited by fatigue Patient left: in chair;with call bell/phone within reach         Time: 0828-0852 PT Time Calculation (min) (ACUTE ONLY): 24 min  Charges:  $Therapeutic Exercise: 8-22 mins $Therapeutic Activity: 8-22 mins                    G Codes:       Danielle Dess, PTA 04/23/17, 9:23 AM

## 2017-04-23 NOTE — Care Management (Addendum)
Spoke with [name] at the bedside. Declining skilled nursing at this time. Agreed to Home Health and Physical therapy in the home. Would like a rolling Raigoza and nebulizer for home. Did not qualify for home oxygen, Discussed Home Health agencies. Chose [facility]. [name], [facility] representative updated.  Family will transport Alicia GreetBrenda S Lyndal Reggio  RN MSN CCM Care Management [phone]

## 2017-04-24 NOTE — Discharge Summary (Signed)
[facility] [place] [facility]   PATIENT NAME: Alicia Rivera    MR#:  [id number]  DATE OF BIRTH:  08/11/1956  DATE OF ADMISSION:  04/15/2017 ADMITTING PHYSICIAN: Arnaldo NatalMichael S Diamond, MD  DATE OF DISCHARGE: 04/23/2017  2:38 PM  PRIMARY CARE PHYSICIAN: Jenne PaneLlc, [facility]    ADMISSION DIAGNOSIS:  COPD exacerbation (HCC) [J44.1] Hypertensive emergency, no CHF [I16.1] Acute respiratory failure with hypoxemia (HCC) [J96.01]  DISCHARGE DIAGNOSIS:  Active Problems:   Acute respiratory failure with hypoxemia (HCC)   COPD exacerbation (HCC)   Palliative care by specialist   Goals of care, counseling/discussion   Acute encephalopathy   Smoker   SECONDARY DIAGNOSIS:   Past Medical History:  Diagnosis Date  . COPD (chronic obstructive pulmonary disease) (HCC)   . HTN (hypertension)   . Tobacco abuse     HOSPITAL COURSE:   61 year old female with past medical history of COPD, hypertension, who presented to the hospital due to shortness of breath and noted to be in acute respiratory failure with hypoxia secondary to COPD exacerbation.  1. COPD exacerbation-this was the cause of patient's acute respiratory failure with hypoxia -Initially patient was admitted to the intensive care unit and intubated. She was treated with steroids, scheduled nebs, Pulmicort nebs. Patient was shortly thereafter extubated within a few days. -Postextubation patient's respiratory status has remained stable. She was ambulated and did not qualify for home oxygen. She is being discharged home on her maintenance inhalers and also with duo nebs as needed with home health services.  2. Altered mental status/encephalopathy-patient postextubation was quite encephalopathic and lethargic. She underwent a CT scan of the head, MRI of the brain which was negative. She was seen by neurology and also underwent an EEG which was negative for seizures. She was also given boluses of Narcan along with  a Narcan drip which did not improve her symptoms. -With supportive care although her mental status has significantly improved and now back to baseline. She is following commands and worked well with physical therapy and not being discharged home with home health services.   3. Hyponatremia-this was mild and patient was asymptomatic. 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  4. Hypokalemia- this is also improved and resolved with supplementation.    5. GERD- pt. Will continue Protonix.  6. Essential hypertension-pt. Will resume her HCTZ upon discharge.   DISCHARGE CONDITIONS:   Stable.   CONSULTS OBTAINED:  Treatment Team:  Thana Farreynolds, Leslie, MD  DRUG ALLERGIES:   Allergies  Allergen Reactions  . Sulfa Antibiotics     DISCHARGE MEDICATIONS:   Allergies as of 04/23/2017      Reactions   Sulfa Antibiotics       Medication List    STOP taking these medications   azithromycin 250 MG tablet Commonly known as:  ZITHROMAX     TAKE these medications   buPROPion 150 MG 24 hr tablet Commonly known as:  WELLBUTRIN XL Take 1 tablet (150 mg total) by mouth 2 (two) times daily.   busPIRone 10 MG tablet Commonly known as:  BUSPAR Take 1 tablet by mouth at bedtime.   clonazePAM 1 MG tablet Commonly known as:  KLONOPIN Take 1 tablet by mouth 2 (two) times daily.   DALIRESP 500 MCG Tabs tablet Generic drug:  roflumilast Take 1 tablet by mouth daily.   DULoxetine 20 MG capsule Commonly known as:  CYMBALTA Take 2 capsules by mouth every morning.  hydrochlorothiazide 12.5 MG tablet Commonly known as:  HYDRODIURIL Take 1 tablet by mouth daily.   ipratropium-albuterol 0.5-2.5 (3) MG/3ML Soln Commonly known as:  DUONEB Take 3 mLs by nebulization every 6 (six) hours as needed.   LORazepam 1 MG tablet Commonly known as:  ATIVAN Take 1 tablet (1 mg total) by mouth daily as needed.   montelukast 10 MG tablet Commonly known  as:  SINGULAIR Take 1 tablet by mouth daily.   predniSONE 20 MG tablet Commonly known as:  DELTASONE Take 2 tablets by mouth daily.   PROAIR HFA 108 (90 Base) MCG/ACT inhaler Generic drug:  albuterol Inhale 1 puff into the lungs every 4 (four) hours as needed.   SPIRIVA HANDIHALER 18 MCG inhalation capsule Generic drug:  tiotropium Place 1 capsule (18 mcg total) into inhaler and inhale daily. What changed:  how much to take   SYMBICORT 160-4.5 MCG/ACT inhaler Generic drug:  budesonide-formoterol Inhale 1 puff into the lungs 2 (two) times daily.         DISCHARGE INSTRUCTIONS:   DIET:  Cardiac diet  DISCHARGE CONDITION:  Stable  ACTIVITY:  Activity as tolerated  OXYGEN:  Home Oxygen: No.   Oxygen Delivery: room air  DISCHARGE LOCATION:  Home with Home Health Nursing, PT   If you experience worsening of your admission symptoms, develop shortness of breath, life threatening emergency, suicidal or homicidal thoughts you must seek medical attention immediately by calling 911 or calling your MD immediately  if symptoms less severe.  You Must read complete instructions/literature along with all the possible adverse reactions/side effects for all the Medicines you take and that have been prescribed to you. Take any new Medicines after you have completely understood and accpet all the possible adverse reactions/side effects.   Please note  You were cared for by a hospitalist during your hospital stay. If you have any questions about your discharge medications or the care you received while you were in the hospital after you are discharged, you can call the unit and asked to speak with the hospitalist on call if the hospitalist that took care of you is not available. Once you are discharged, your primary care physician will handle any further medical issues. Please note that NO REFILLS for any discharge medications will be authorized once you are discharged, as it is  imperative that you return to your primary care physician (or establish a relationship with a primary care physician if you do not have one) for your aftercare needs so that they can reassess your need for medications and monitor your lab values.     Today   Shortness of breath much improved.  Does not want to go to SNF.  Will d/c home with Home Health Today.   VITAL SIGNS:  Blood pressure 131/77, pulse 85, temperature 97.7 F (36.5 C), temperature source Oral, resp. rate 20, height 5\' 5"  (1.651 m), weight 83.6 kg (184 lb 6.4 oz), SpO2 97 %.  I/O:  No intake or output data in the 24 hours ending 04/24/17 1621  PHYSICAL EXAMINATION:   GENERAL:  61 y.o.-year-old patient Sitting up in chair in no apparent distress.  EYES: Pupils equal, round, reactive to light and accommodation. No scleral icterus. Extraocular muscles intact.  HEENT: Head atraumatic, normocephalic. Oropharynx and nasopharynx clear.  NECK:  Supple, no jugular venous distention. No thyroid enlargement, no tenderness.  LUNGS: Prolonged inspiratory and expiratory phase, No wheezing, rales, rhonchi. No use of accessory muscles of respiration.  CARDIOVASCULAR: S1,  S2 normal. No murmurs, rubs, or gallops.  ABDOMEN: Soft, nontender, nondistended. Bowel sounds present. No organomegaly or mass.  EXTREMITIES: No cyanosis, clubbing or edema b/l.    NEUROLOGIC: Cranial nerves II through XII are intact. No focal Motor or sensory deficits b/l.   PSYCHIATRIC: The patient is alert and oriented x 3.  SKIN: No obvious rash, lesion, or ulcer.   DATA REVIEW:   CBC  Recent Labs Lab 04/20/17 0521  WBC 21.7*  HGB 13.7  HCT 39.6  PLT 374    Chemistries   Recent Labs Lab 04/21/17 0715  04/23/17 0400  NA 142  < > 140  K 2.8*  < > 3.1*  CL 101  < > 105  CO2 29  < > 27  GLUCOSE 144*  < > 103*  BUN 32*  < > 25*  CREATININE 0.85  < > 0.68  CALCIUM 8.7*  < > 8.5*  MG 1.9  --   --   < > = values in this interval not  displayed.  Cardiac Enzymes No results for input(s): TROPONINI in the last 168 hours.  Microbiology Results  Results for orders placed or performed during the hospital encounter of 04/15/17  MRSA PCR Screening     Status: None   Collection Time: 04/16/17 12:26 PM  Result Value Ref Range Status   MRSA by PCR NEGATIVE NEGATIVE Final    Comment:        The GeneXpert MRSA Assay (FDA approved for NASAL specimens only), is one component of a comprehensive MRSA colonization surveillance program. It is not intended to diagnose MRSA infection nor to guide or monitor treatment for MRSA infections.   Culture, respiratory (NON-Expectorated)     Status: None   Collection Time: 04/18/17 11:11 AM  Result Value Ref Range Status   Specimen Description TRACHEAL ASPIRATE  Final   Special Requests NONE  Final   Gram Stain   Final    FEW WBC PRESENT, PREDOMINANTLY MONONUCLEAR RARE GRAM POSITIVE COCCI IN PAIRS AND CHAINS    Culture   Final    Consistent with normal respiratory flora. Performed at [facility], [address], [place], [place] [address]    Report Status 04/21/2017 FINAL  Final    RADIOLOGY:  No results found.    Management plans discussed with the patient, family and they are in agreement.  CODE STATUS:  Code Status History    Date Active Date Inactive Code Status Order ID Comments User Context   04/16/2017  8:30 AM 04/23/2017  5:38 PM Full Code [id number]  Arnaldo Natal, MD Inpatient      TOTAL TIME TAKING CARE OF THIS PATIENT: 40 minutes.    Houston Siren M.D on 04/24/2017 at 4:21 PM  Between 7am to 6pm - Pager - [phone]  After 6pm go to [email or web address] - Therapist, nutritional Hospitalists  Office  [phone]  CC: Primary care physician; [facility]

## 2017-04-27 DIAGNOSIS — R0602 Shortness of breath: Secondary | ICD-10-CM | POA: Diagnosis not present

## 2017-04-27 DIAGNOSIS — J441 Chronic obstructive pulmonary disease with (acute) exacerbation: Secondary | ICD-10-CM | POA: Diagnosis not present

## 2017-04-27 DIAGNOSIS — F17210 Nicotine dependence, cigarettes, uncomplicated: Secondary | ICD-10-CM | POA: Diagnosis not present

## 2017-04-27 DIAGNOSIS — F411 Generalized anxiety disorder: Secondary | ICD-10-CM | POA: Diagnosis not present

## 2017-04-27 DIAGNOSIS — J449 Chronic obstructive pulmonary disease, unspecified: Secondary | ICD-10-CM | POA: Diagnosis not present

## 2017-04-30 DIAGNOSIS — F17210 Nicotine dependence, cigarettes, uncomplicated: Secondary | ICD-10-CM | POA: Diagnosis not present

## 2017-04-30 DIAGNOSIS — Z9181 History of falling: Secondary | ICD-10-CM | POA: Diagnosis not present

## 2017-04-30 DIAGNOSIS — F329 Major depressive disorder, single episode, unspecified: Secondary | ICD-10-CM | POA: Diagnosis not present

## 2017-04-30 DIAGNOSIS — J441 Chronic obstructive pulmonary disease with (acute) exacerbation: Secondary | ICD-10-CM | POA: Diagnosis not present

## 2017-04-30 DIAGNOSIS — K219 Gastro-esophageal reflux disease without esophagitis: Secondary | ICD-10-CM | POA: Diagnosis not present

## 2017-04-30 DIAGNOSIS — I10 Essential (primary) hypertension: Secondary | ICD-10-CM | POA: Diagnosis not present

## 2017-04-30 DIAGNOSIS — J9621 Acute and chronic respiratory failure with hypoxia: Secondary | ICD-10-CM | POA: Diagnosis not present

## 2017-05-04 DIAGNOSIS — F329 Major depressive disorder, single episode, unspecified: Secondary | ICD-10-CM | POA: Diagnosis not present

## 2017-05-04 DIAGNOSIS — F17210 Nicotine dependence, cigarettes, uncomplicated: Secondary | ICD-10-CM | POA: Diagnosis not present

## 2017-05-04 DIAGNOSIS — I10 Essential (primary) hypertension: Secondary | ICD-10-CM | POA: Diagnosis not present

## 2017-05-04 DIAGNOSIS — J441 Chronic obstructive pulmonary disease with (acute) exacerbation: Secondary | ICD-10-CM | POA: Diagnosis not present

## 2017-05-04 DIAGNOSIS — J9621 Acute and chronic respiratory failure with hypoxia: Secondary | ICD-10-CM | POA: Diagnosis not present

## 2017-05-04 DIAGNOSIS — K219 Gastro-esophageal reflux disease without esophagitis: Secondary | ICD-10-CM | POA: Diagnosis not present

## 2017-05-06 DIAGNOSIS — K219 Gastro-esophageal reflux disease without esophagitis: Secondary | ICD-10-CM | POA: Diagnosis not present

## 2017-05-06 DIAGNOSIS — F329 Major depressive disorder, single episode, unspecified: Secondary | ICD-10-CM | POA: Diagnosis not present

## 2017-05-06 DIAGNOSIS — I10 Essential (primary) hypertension: Secondary | ICD-10-CM | POA: Diagnosis not present

## 2017-05-06 DIAGNOSIS — J441 Chronic obstructive pulmonary disease with (acute) exacerbation: Secondary | ICD-10-CM | POA: Diagnosis not present

## 2017-05-06 DIAGNOSIS — J9621 Acute and chronic respiratory failure with hypoxia: Secondary | ICD-10-CM | POA: Diagnosis not present

## 2017-05-06 DIAGNOSIS — F17210 Nicotine dependence, cigarettes, uncomplicated: Secondary | ICD-10-CM | POA: Diagnosis not present

## 2017-05-07 DIAGNOSIS — K219 Gastro-esophageal reflux disease without esophagitis: Secondary | ICD-10-CM | POA: Diagnosis not present

## 2017-05-07 DIAGNOSIS — F329 Major depressive disorder, single episode, unspecified: Secondary | ICD-10-CM | POA: Diagnosis not present

## 2017-05-07 DIAGNOSIS — I10 Essential (primary) hypertension: Secondary | ICD-10-CM | POA: Diagnosis not present

## 2017-05-07 DIAGNOSIS — J9621 Acute and chronic respiratory failure with hypoxia: Secondary | ICD-10-CM | POA: Diagnosis not present

## 2017-05-07 DIAGNOSIS — J441 Chronic obstructive pulmonary disease with (acute) exacerbation: Secondary | ICD-10-CM | POA: Diagnosis not present

## 2017-05-07 DIAGNOSIS — F17210 Nicotine dependence, cigarettes, uncomplicated: Secondary | ICD-10-CM | POA: Diagnosis not present

## 2017-05-08 ENCOUNTER — Other Ambulatory Visit: Payer: Self-pay | Admitting: Family Medicine

## 2017-05-08 ENCOUNTER — Ambulatory Visit
Admission: RE | Admit: 2017-05-08 | Discharge: 2017-05-08 | Disposition: A | Discharge status: Home / Self Care | Payer: Medicare Other | Source: Ambulatory Visit | Attending: Family Medicine | Admitting: Family Medicine

## 2017-05-08 DIAGNOSIS — R600 Localized edema: Secondary | ICD-10-CM | POA: Diagnosis not present

## 2017-05-08 DIAGNOSIS — J449 Chronic obstructive pulmonary disease, unspecified: Secondary | ICD-10-CM | POA: Diagnosis not present

## 2017-05-08 DIAGNOSIS — M79605 Pain in left leg: Secondary | ICD-10-CM | POA: Diagnosis not present

## 2017-05-08 DIAGNOSIS — M7989 Other specified soft tissue disorders: Secondary | ICD-10-CM | POA: Diagnosis not present

## 2017-05-08 DIAGNOSIS — R936 Abnormal findings on diagnostic imaging of limbs: Secondary | ICD-10-CM | POA: Diagnosis not present

## 2017-05-08 DIAGNOSIS — I10 Essential (primary) hypertension: Secondary | ICD-10-CM | POA: Diagnosis not present

## 2017-05-08 DIAGNOSIS — M79662 Pain in left lower leg: Secondary | ICD-10-CM | POA: Diagnosis not present

## 2017-05-08 DIAGNOSIS — F17210 Nicotine dependence, cigarettes, uncomplicated: Secondary | ICD-10-CM | POA: Diagnosis not present

## 2017-05-12 DIAGNOSIS — Z882 Allergy status to sulfonamides status: Secondary | ICD-10-CM | POA: Diagnosis not present

## 2017-05-12 DIAGNOSIS — J441 Chronic obstructive pulmonary disease with (acute) exacerbation: Secondary | ICD-10-CM | POA: Diagnosis not present

## 2017-05-12 DIAGNOSIS — I10 Essential (primary) hypertension: Secondary | ICD-10-CM | POA: Diagnosis not present

## 2017-05-12 DIAGNOSIS — I119 Hypertensive heart disease without heart failure: Secondary | ICD-10-CM | POA: Diagnosis not present

## 2017-05-12 DIAGNOSIS — F329 Major depressive disorder, single episode, unspecified: Secondary | ICD-10-CM | POA: Diagnosis not present

## 2017-05-12 DIAGNOSIS — K219 Gastro-esophageal reflux disease without esophagitis: Secondary | ICD-10-CM | POA: Diagnosis not present

## 2017-05-12 DIAGNOSIS — J9621 Acute and chronic respiratory failure with hypoxia: Secondary | ICD-10-CM | POA: Diagnosis not present

## 2017-05-12 DIAGNOSIS — Z79899 Other long term (current) drug therapy: Secondary | ICD-10-CM | POA: Diagnosis not present

## 2017-05-12 DIAGNOSIS — J449 Chronic obstructive pulmonary disease, unspecified: Secondary | ICD-10-CM | POA: Diagnosis not present

## 2017-05-12 DIAGNOSIS — R9431 Abnormal electrocardiogram [ECG] [EKG]: Secondary | ICD-10-CM | POA: Diagnosis not present

## 2017-05-12 DIAGNOSIS — Z7951 Long term (current) use of inhaled steroids: Secondary | ICD-10-CM | POA: Diagnosis not present

## 2017-05-12 DIAGNOSIS — R0789 Other chest pain: Secondary | ICD-10-CM | POA: Diagnosis not present

## 2017-05-12 DIAGNOSIS — Z8673 Personal history of transient ischemic attack (TIA), and cerebral infarction without residual deficits: Secondary | ICD-10-CM | POA: Diagnosis not present

## 2017-05-12 DIAGNOSIS — F17210 Nicotine dependence, cigarettes, uncomplicated: Secondary | ICD-10-CM | POA: Diagnosis not present

## 2017-05-13 DIAGNOSIS — F411 Generalized anxiety disorder: Secondary | ICD-10-CM | POA: Diagnosis not present

## 2017-05-13 DIAGNOSIS — J449 Chronic obstructive pulmonary disease, unspecified: Secondary | ICD-10-CM | POA: Diagnosis not present

## 2017-05-13 DIAGNOSIS — I10 Essential (primary) hypertension: Secondary | ICD-10-CM | POA: Diagnosis not present

## 2017-05-14 DIAGNOSIS — F329 Major depressive disorder, single episode, unspecified: Secondary | ICD-10-CM | POA: Diagnosis not present

## 2017-05-14 DIAGNOSIS — J441 Chronic obstructive pulmonary disease with (acute) exacerbation: Secondary | ICD-10-CM | POA: Diagnosis not present

## 2017-05-14 DIAGNOSIS — F17210 Nicotine dependence, cigarettes, uncomplicated: Secondary | ICD-10-CM | POA: Diagnosis not present

## 2017-05-14 DIAGNOSIS — I10 Essential (primary) hypertension: Secondary | ICD-10-CM | POA: Diagnosis not present

## 2017-05-14 DIAGNOSIS — J9621 Acute and chronic respiratory failure with hypoxia: Secondary | ICD-10-CM | POA: Diagnosis not present

## 2017-05-14 DIAGNOSIS — K219 Gastro-esophageal reflux disease without esophagitis: Secondary | ICD-10-CM | POA: Diagnosis not present

## 2017-05-19 DIAGNOSIS — K219 Gastro-esophageal reflux disease without esophagitis: Secondary | ICD-10-CM | POA: Diagnosis not present

## 2017-05-19 DIAGNOSIS — I10 Essential (primary) hypertension: Secondary | ICD-10-CM | POA: Diagnosis not present

## 2017-05-19 DIAGNOSIS — F17210 Nicotine dependence, cigarettes, uncomplicated: Secondary | ICD-10-CM | POA: Diagnosis not present

## 2017-05-19 DIAGNOSIS — F329 Major depressive disorder, single episode, unspecified: Secondary | ICD-10-CM | POA: Diagnosis not present

## 2017-05-19 DIAGNOSIS — J441 Chronic obstructive pulmonary disease with (acute) exacerbation: Secondary | ICD-10-CM | POA: Diagnosis not present

## 2017-05-19 DIAGNOSIS — J9621 Acute and chronic respiratory failure with hypoxia: Secondary | ICD-10-CM | POA: Diagnosis not present

## 2017-05-20 DIAGNOSIS — F17210 Nicotine dependence, cigarettes, uncomplicated: Secondary | ICD-10-CM | POA: Diagnosis not present

## 2017-05-20 DIAGNOSIS — J449 Chronic obstructive pulmonary disease, unspecified: Secondary | ICD-10-CM | POA: Diagnosis not present

## 2017-05-20 DIAGNOSIS — I10 Essential (primary) hypertension: Secondary | ICD-10-CM | POA: Diagnosis not present

## 2017-05-21 DIAGNOSIS — J9621 Acute and chronic respiratory failure with hypoxia: Secondary | ICD-10-CM | POA: Diagnosis not present

## 2017-05-21 DIAGNOSIS — K219 Gastro-esophageal reflux disease without esophagitis: Secondary | ICD-10-CM | POA: Diagnosis not present

## 2017-05-21 DIAGNOSIS — J441 Chronic obstructive pulmonary disease with (acute) exacerbation: Secondary | ICD-10-CM | POA: Diagnosis not present

## 2017-05-21 DIAGNOSIS — I10 Essential (primary) hypertension: Secondary | ICD-10-CM | POA: Diagnosis not present

## 2017-05-21 DIAGNOSIS — F17210 Nicotine dependence, cigarettes, uncomplicated: Secondary | ICD-10-CM | POA: Diagnosis not present

## 2017-05-21 DIAGNOSIS — F329 Major depressive disorder, single episode, unspecified: Secondary | ICD-10-CM | POA: Diagnosis not present

## 2017-05-26 DIAGNOSIS — K219 Gastro-esophageal reflux disease without esophagitis: Secondary | ICD-10-CM | POA: Diagnosis not present

## 2017-05-26 DIAGNOSIS — I10 Essential (primary) hypertension: Secondary | ICD-10-CM | POA: Diagnosis not present

## 2017-05-26 DIAGNOSIS — F17210 Nicotine dependence, cigarettes, uncomplicated: Secondary | ICD-10-CM | POA: Diagnosis not present

## 2017-05-26 DIAGNOSIS — F329 Major depressive disorder, single episode, unspecified: Secondary | ICD-10-CM | POA: Diagnosis not present

## 2017-05-26 DIAGNOSIS — J9621 Acute and chronic respiratory failure with hypoxia: Secondary | ICD-10-CM | POA: Diagnosis not present

## 2017-05-26 DIAGNOSIS — J441 Chronic obstructive pulmonary disease with (acute) exacerbation: Secondary | ICD-10-CM | POA: Diagnosis not present

## 2017-05-28 DIAGNOSIS — J9621 Acute and chronic respiratory failure with hypoxia: Secondary | ICD-10-CM | POA: Diagnosis not present

## 2017-05-28 DIAGNOSIS — F329 Major depressive disorder, single episode, unspecified: Secondary | ICD-10-CM | POA: Diagnosis not present

## 2017-05-28 DIAGNOSIS — J441 Chronic obstructive pulmonary disease with (acute) exacerbation: Secondary | ICD-10-CM | POA: Diagnosis not present

## 2017-05-28 DIAGNOSIS — K219 Gastro-esophageal reflux disease without esophagitis: Secondary | ICD-10-CM | POA: Diagnosis not present

## 2017-05-28 DIAGNOSIS — I10 Essential (primary) hypertension: Secondary | ICD-10-CM | POA: Diagnosis not present

## 2017-05-28 DIAGNOSIS — F17210 Nicotine dependence, cigarettes, uncomplicated: Secondary | ICD-10-CM | POA: Diagnosis not present

## 2017-06-02 DIAGNOSIS — K219 Gastro-esophageal reflux disease without esophagitis: Secondary | ICD-10-CM | POA: Diagnosis not present

## 2017-06-02 DIAGNOSIS — I10 Essential (primary) hypertension: Secondary | ICD-10-CM | POA: Diagnosis not present

## 2017-06-02 DIAGNOSIS — F17210 Nicotine dependence, cigarettes, uncomplicated: Secondary | ICD-10-CM | POA: Diagnosis not present

## 2017-06-02 DIAGNOSIS — J441 Chronic obstructive pulmonary disease with (acute) exacerbation: Secondary | ICD-10-CM | POA: Diagnosis not present

## 2017-06-02 DIAGNOSIS — F329 Major depressive disorder, single episode, unspecified: Secondary | ICD-10-CM | POA: Diagnosis not present

## 2017-06-02 DIAGNOSIS — J9621 Acute and chronic respiratory failure with hypoxia: Secondary | ICD-10-CM | POA: Diagnosis not present

## 2017-06-03 DIAGNOSIS — F3342 Major depressive disorder, recurrent, in full remission: Secondary | ICD-10-CM | POA: Diagnosis not present

## 2017-06-03 DIAGNOSIS — J449 Chronic obstructive pulmonary disease, unspecified: Secondary | ICD-10-CM | POA: Diagnosis not present

## 2017-06-03 DIAGNOSIS — I10 Essential (primary) hypertension: Secondary | ICD-10-CM | POA: Diagnosis not present

## 2017-06-03 DIAGNOSIS — F17210 Nicotine dependence, cigarettes, uncomplicated: Secondary | ICD-10-CM | POA: Diagnosis not present

## 2017-06-03 DIAGNOSIS — F411 Generalized anxiety disorder: Secondary | ICD-10-CM | POA: Diagnosis not present

## 2017-06-09 DIAGNOSIS — J9621 Acute and chronic respiratory failure with hypoxia: Secondary | ICD-10-CM | POA: Diagnosis not present

## 2017-06-09 DIAGNOSIS — F329 Major depressive disorder, single episode, unspecified: Secondary | ICD-10-CM | POA: Diagnosis not present

## 2017-06-09 DIAGNOSIS — F17210 Nicotine dependence, cigarettes, uncomplicated: Secondary | ICD-10-CM | POA: Diagnosis not present

## 2017-06-09 DIAGNOSIS — I10 Essential (primary) hypertension: Secondary | ICD-10-CM | POA: Diagnosis not present

## 2017-06-09 DIAGNOSIS — J441 Chronic obstructive pulmonary disease with (acute) exacerbation: Secondary | ICD-10-CM | POA: Diagnosis not present

## 2017-06-09 DIAGNOSIS — K219 Gastro-esophageal reflux disease without esophagitis: Secondary | ICD-10-CM | POA: Diagnosis not present

## 2017-06-23 DIAGNOSIS — F17210 Nicotine dependence, cigarettes, uncomplicated: Secondary | ICD-10-CM | POA: Diagnosis not present

## 2017-06-23 DIAGNOSIS — F329 Major depressive disorder, single episode, unspecified: Secondary | ICD-10-CM | POA: Diagnosis not present

## 2017-06-23 DIAGNOSIS — K219 Gastro-esophageal reflux disease without esophagitis: Secondary | ICD-10-CM | POA: Diagnosis not present

## 2017-06-23 DIAGNOSIS — I10 Essential (primary) hypertension: Secondary | ICD-10-CM | POA: Diagnosis not present

## 2017-06-23 DIAGNOSIS — J441 Chronic obstructive pulmonary disease with (acute) exacerbation: Secondary | ICD-10-CM | POA: Diagnosis not present

## 2017-06-23 DIAGNOSIS — J9621 Acute and chronic respiratory failure with hypoxia: Secondary | ICD-10-CM | POA: Diagnosis not present

## 2017-06-24 DIAGNOSIS — J069 Acute upper respiratory infection, unspecified: Secondary | ICD-10-CM | POA: Diagnosis not present

## 2017-06-24 DIAGNOSIS — R05 Cough: Secondary | ICD-10-CM | POA: Diagnosis not present

## 2017-06-24 DIAGNOSIS — J449 Chronic obstructive pulmonary disease, unspecified: Secondary | ICD-10-CM | POA: Diagnosis not present

## 2017-09-17 ENCOUNTER — Ambulatory Visit: Payer: Self-pay | Admitting: Internal Medicine

## 2017-09-18 ENCOUNTER — Encounter: Payer: Self-pay | Admitting: Emergency Medicine

## 2017-09-18 ENCOUNTER — Emergency Department: Payer: Medicare Other

## 2017-09-18 ENCOUNTER — Emergency Department
Admission: EM | Admit: 2017-09-18 | Discharge: 2017-09-18 | Disposition: A | Discharge status: Home / Self Care | Payer: Medicare Other | Attending: Emergency Medicine | Admitting: Emergency Medicine

## 2017-09-18 DIAGNOSIS — R062 Wheezing: Secondary | ICD-10-CM | POA: Insufficient documentation

## 2017-09-18 DIAGNOSIS — F17200 Nicotine dependence, unspecified, uncomplicated: Secondary | ICD-10-CM | POA: Insufficient documentation

## 2017-09-18 DIAGNOSIS — R509 Fever, unspecified: Secondary | ICD-10-CM | POA: Diagnosis not present

## 2017-09-18 DIAGNOSIS — R0602 Shortness of breath: Secondary | ICD-10-CM | POA: Insufficient documentation

## 2017-09-18 DIAGNOSIS — I10 Essential (primary) hypertension: Secondary | ICD-10-CM | POA: Insufficient documentation

## 2017-09-18 DIAGNOSIS — R197 Diarrhea, unspecified: Secondary | ICD-10-CM | POA: Insufficient documentation

## 2017-09-18 DIAGNOSIS — Z79899 Other long term (current) drug therapy: Secondary | ICD-10-CM | POA: Diagnosis not present

## 2017-09-18 DIAGNOSIS — J189 Pneumonia, unspecified organism: Secondary | ICD-10-CM | POA: Diagnosis not present

## 2017-09-18 DIAGNOSIS — J441 Chronic obstructive pulmonary disease with (acute) exacerbation: Secondary | ICD-10-CM | POA: Diagnosis not present

## 2017-09-18 DIAGNOSIS — R05 Cough: Secondary | ICD-10-CM | POA: Diagnosis present

## 2017-09-18 HISTORY — DX: Pneumonia, unspecified organism: J18.9

## 2017-09-18 LAB — CBC WITH DIFFERENTIAL/PLATELET
Basophils Absolute: 0 10*3/uL (ref 0–0.1)
Basophils Relative: 0 %
EOS ABS: 0.1 10*3/uL (ref 0–0.7)
EOS PCT: 1 %
HCT: 39.1 % (ref 35.0–47.0)
Hemoglobin: 13.8 g/dL (ref 12.0–16.0)
LYMPHS ABS: 1.2 10*3/uL (ref 1.0–3.6)
LYMPHS PCT: 8 %
MCH: 31.9 pg (ref 26.0–34.0)
MCHC: 35.2 g/dL (ref 32.0–36.0)
MCV: 90.7 fL (ref 80.0–100.0)
MONO ABS: 1.4 10*3/uL — AB (ref 0.2–0.9)
MONOS PCT: 9 %
Neutro Abs: 12.5 10*3/uL — ABNORMAL HIGH (ref 1.4–6.5)
Neutrophils Relative %: 82 %
PLATELETS: 321 10*3/uL (ref 150–440)
RBC: 4.31 MIL/uL (ref 3.80–5.20)
RDW: 13.4 % (ref 11.5–14.5)
WBC: 15.4 10*3/uL — AB (ref 3.6–11.0)

## 2017-09-18 LAB — COMPREHENSIVE METABOLIC PANEL
ALT: 15 U/L (ref 14–54)
ANION GAP: 13 (ref 5–15)
AST: 20 U/L (ref 15–41)
Albumin: 4.4 g/dL (ref 3.5–5.0)
Alkaline Phosphatase: 84 U/L (ref 38–126)
BUN: 11 mg/dL (ref 6–20)
CHLORIDE: 92 mmol/L — AB (ref 101–111)
CO2: 23 mmol/L (ref 22–32)
CREATININE: 0.92 mg/dL (ref 0.44–1.00)
Calcium: 9.3 mg/dL (ref 8.9–10.3)
GFR calc non Af Amer: >60 mL/min (ref >60)
Glucose, Bld: 130 mg/dL — ABNORMAL HIGH (ref 65–99)
Potassium: 3.2 mmol/L — ABNORMAL LOW (ref 3.5–5.1)
SODIUM: 128 mmol/L — AB (ref 135–145)
Total Bilirubin: 0.8 mg/dL (ref 0.3–1.2)
Total Protein: 8.1 g/dL (ref 6.5–8.1)

## 2017-09-18 LAB — INFLUENZA PANEL BY PCR (TYPE A & B)
INFLAPCR: NEGATIVE
INFLBPCR: NEGATIVE

## 2017-09-18 LAB — TROPONIN I

## 2017-09-18 MED ORDER — DOXYCYCLINE HYCLATE 100 MG PO TABS: 100.0000 mg | ORAL_TABLET | Freq: Once | ORAL | Status: DC

## 2017-09-18 MED ORDER — PREDNISONE 20 MG PO TABS: 60.0000 mg | ORAL_TABLET | Freq: Every day | ORAL | 0 refills | Status: AC

## 2017-09-18 MED ORDER — AZITHROMYCIN 500 MG PO TABS
500.0000 mg | ORAL_TABLET | Freq: Once | ORAL | Status: AC
  Administered 2017-09-18: 500 mg via ORAL
  Filled 2017-09-18: qty 1

## 2017-09-18 MED ORDER — ALBUTEROL SULFATE HFA 108 (90 BASE) MCG/ACT IN AERS: 2.0000 | INHALATION_SPRAY | Freq: Four times a day (QID) | RESPIRATORY_TRACT | 2 refills | Status: DC | PRN

## 2017-09-18 MED ORDER — CEFTRIAXONE SODIUM IN DEXTROSE 20 MG/ML IV SOLN
1.0000 g | Freq: Once | INTRAVENOUS | Status: AC
  Administered 2017-09-18: 1 g via INTRAVENOUS
  Filled 2017-09-18: qty 50

## 2017-09-18 MED ORDER — ONDANSETRON HCL 4 MG/2ML IJ SOLN
INTRAMUSCULAR | Status: AC
  Filled 2017-09-18: qty 2

## 2017-09-18 MED ORDER — AMOXICILLIN-POT CLAVULANATE 875-125 MG PO TABS: 1.0000 | ORAL_TABLET | Freq: Two times a day (BID) | ORAL | 0 refills | Status: AC

## 2017-09-18 MED ORDER — ACETAMINOPHEN 500 MG PO TABS
ORAL_TABLET | ORAL | Status: AC
  Administered 2017-09-18: 1000 mg via ORAL
  Filled 2017-09-18: qty 2

## 2017-09-18 MED ORDER — SODIUM CHLORIDE 0.9 % IV BOLUS (SEPSIS)
1000.0000 mL | Freq: Once | INTRAVENOUS | Status: AC
Start: 2017-09-18 — End: 2017-09-18
  Administered 2017-09-18: 1000 mL via INTRAVENOUS

## 2017-09-18 MED ORDER — ONDANSETRON HCL 4 MG/2ML IJ SOLN
4.0000 mg | INTRAMUSCULAR | Status: AC
  Administered 2017-09-18: 4 mg via INTRAVENOUS

## 2017-09-18 MED ORDER — ACETAMINOPHEN 500 MG PO TABS
1000.0000 mg | ORAL_TABLET | ORAL | Status: AC
  Administered 2017-09-18: 1000 mg via ORAL

## 2017-09-18 MED ORDER — IPRATROPIUM-ALBUTEROL 0.5-2.5 (3) MG/3ML IN SOLN
3.0000 mL | Freq: Once | RESPIRATORY_TRACT | Status: AC
  Administered 2017-09-18: 3 mL via RESPIRATORY_TRACT
  Filled 2017-09-18: qty 3

## 2017-09-18 MED ORDER — AZITHROMYCIN 250 MG PO TABS: ORAL_TABLET | ORAL | 0 refills | Status: DC

## 2017-09-18 MED ORDER — METHYLPREDNISOLONE SODIUM SUCC 125 MG IJ SOLR
125.0000 mg | Freq: Once | INTRAMUSCULAR | Status: AC
  Administered 2017-09-18: 125 mg via INTRAVENOUS
  Filled 2017-09-18: qty 2

## 2017-09-18 NOTE — ED Notes (Signed)
To Room 13, Jerrie RN aware.

## 2017-09-18 NOTE — ED Provider Notes (Signed)
[facility] Emergency Department Provider Note  ____________________________________________  Time seen: Approximately 11:53 AM  I have reviewed the triage vital signs and the nursing notes.   HISTORY  Chief Complaint Cough   HPI Alicia Rivera is a 61 y.o. female with a h/o COPD, HTN, smoking who presents for evaluation of SOb. Patient reports dry cough, wheezing, and SOB x2-3 days. Has had subjective fever and chills at home and also watery diarrhea. No nausea or vomiting. No chest pain. No personal or family history of blood clots, no recent travel or immobilization, no history of cancer, no hemoptysis exogenous hormones. She has been using her inhalers at home. Patient continues to smoke.6  Past Medical History:  Diagnosis Date  . COPD (chronic obstructive pulmonary disease) (HCC)   . HTN (hypertension)   . Tobacco abuse     Patient Active Problem List   Diagnosis Date Noted  . Smoker   . Acute encephalopathy   . Acute respiratory failure with hypoxemia (HCC) 04/16/2017  . COPD exacerbation (HCC)   . Palliative care by specialist   . Goals of care, counseling/discussion     Past Surgical History:  Procedure Laterality Date  . none      Prior to Admission medications   Medication Sig Start Date End Date Taking? Authorizing Provider  DALIRESP 500 MCG TABS tablet Take 1 tablet by mouth daily. 04/02/17  Yes [provider]  DULoxetine (CYMBALTA) 20 MG capsule Take 2 capsules by mouth every morning. 04/10/17  Yes [provider]  hydrochlorothiazide (HYDRODIURIL) 12.5 MG tablet Take 1 tablet by mouth daily. 02/03/17  Yes [provider]  losartan (COZAAR) 25 MG tablet Take 1 tablet by mouth 2 (two) times daily. 08/16/17  Yes [provider]  montelukast (SINGULAIR) 10 MG tablet Take 1 tablet by mouth daily. 04/09/17  Yes [provider]  SPIRIVA HANDIHALER 18 MCG inhalation capsule Place 1 capsule (18 mcg  total) into inhaler and inhale daily. 04/23/17  Yes Houston Siren, MD  SYMBICORT 160-4.5 MCG/ACT inhaler Inhale 1 puff into the lungs 2 (two) times daily. 04/23/17  Yes Sainani, Rolly Pancake, MD  albuterol (PROVENTIL HFA;VENTOLIN HFA) 108 (90 Base) MCG/ACT inhaler Inhale 2 puffs into the lungs every 6 (six) hours as needed for wheezing or shortness of breath. 09/18/17   Nita Sickle, MD  amoxicillin-clavulanate (AUGMENTIN) 875-125 MG tablet Take 1 tablet by mouth 2 (two) times daily for 10 days. 09/18/17 09/28/17  Nita Sickle, MD  azithromycin (ZITHROMAX) 250 MG tablet Take 1 a day for 4 days 09/18/17   Don Perking, [place], MD  buPROPion (WELLBUTRIN XL) 150 MG 24 hr tablet Take 1 tablet (150 mg total) by mouth 2 (two) times daily. Patient not taking: Reported on 09/18/2017 04/23/17   Houston Siren, MD  busPIRone (BUSPAR) 10 MG tablet Take 1 tablet by mouth at bedtime. 04/09/17   [provider]  clonazePAM (KLONOPIN) 1 MG tablet Take 1 tablet by mouth 2 (two) times daily. 03/06/17   [provider]  ipratropium-albuterol (DUONEB) 0.5-2.5 (3) MG/3ML SOLN Take 3 mLs by nebulization every 6 (six) hours as needed. 04/23/17   Houston Siren, MD  LORazepam (ATIVAN) 1 MG tablet Take 1 tablet (1 mg total) by mouth daily as needed. Patient not taking: Reported on 09/18/2017 04/23/17   Houston Siren, MD  predniSONE (DELTASONE) 20 MG tablet Take 3 tablets (60 mg total) by mouth daily for 4 days. 09/18/17 09/22/17  Don Perking, [place],  MD  PROAIR HFA 108 (90 Base) MCG/ACT inhaler Inhale 1 puff into the lungs every 4 (four) hours as needed. 03/16/17   [provider]    Allergies Sulfa antibiotics  Family History  Problem Relation Age of Onset  . Emphysema Brother     Social History Social History   Tobacco Use  . Smoking status: Light Tobacco Smoker  . Smokeless tobacco: Never Used  Substance Use Topics  . Alcohol use: No  . Drug use: No    Review of  Systems  Constitutional: + fever and chills Eyes: Negative for visual changes. ENT: Negative for sore throat. Neck: No neck pain  Cardiovascular: Negative for chest pain. Respiratory: + shortness of breath, wheezing, cough Gastrointestinal: Negative for abdominal pain, vomiting. + diarrhea. Genitourinary: Negative for dysuria. Musculoskeletal: Negative for back pain. Skin: Negative for rash. Neurological: Negative for headaches, weakness or numbness. Psych: No SI or HI  ____________________________________________   PHYSICAL EXAM:  VITAL SIGNS: ED Triage Vitals  Enc Vitals Group     BP 09/18/17 1018 94/62     Pulse Rate 09/18/17 1018 96     Resp 09/18/17 1018 20     Temp 09/18/17 1018 98.2 F (36.8 C)     Temp Source 09/18/17 1018 Oral     SpO2 09/18/17 1018 96 %     Weight 09/18/17 1016 160 lb (72.6 kg)     Height 09/18/17 1016 5\' 5"  (1.651 m)     Head Circumference --      Peak Flow --      Pain Score --      Pain Loc --      Pain Edu? --      Excl. in GC? --     Constitutional: Alert and oriented. Well appearing and in no apparent distress. HEENT:      Head: Normocephalic and atraumatic.         Eyes: Conjunctivae are normal. Sclera is non-icteric.       Mouth/Throat: Mucous membranes are moist.       Neck: Supple with no signs of meningismus. Cardiovascular: Regular rate and rhythm. No murmurs, gallops, or rubs. 2+ symmetrical distal pulses are present in all extremities. No JVD. Respiratory: Normal respiratory effort, normal sats, decreased air movement with faint wheezes. Gastrointestinal: Soft, non tender, and non distended with positive bowel sounds. No rebound or guarding. Musculoskeletal: Nontender with normal range of motion in all extremities. No edema, cyanosis, or erythema of extremities. Neurologic: Normal speech and language. Face is symmetric. Moving all extremities. No gross focal neurologic deficits are appreciated. Skin: Skin is warm, dry and  intact. No rash noted. Psychiatric: Mood and affect are normal. Speech and behavior are normal.  ____________________________________________   LABS (all labs ordered are listed, but only abnormal results are displayed)  Labs Reviewed  CBC WITH DIFFERENTIAL/PLATELET - Abnormal; Notable for the following components:      Result Value   WBC 15.4 (*)    Neutro Abs 12.5 (*)    Monocytes Absolute 1.4 (*)    All other components within normal limits  COMPREHENSIVE METABOLIC PANEL - Abnormal; Notable for the following components:   Sodium 128 (*)    Potassium 3.2 (*)    Chloride 92 (*)    Glucose, Bld 130 (*)    All other components within normal limits  TROPONIN I  INFLUENZA PANEL BY PCR (TYPE A & B)  LEGIONELLA PNEUMOPHILA SEROGP 1 UR AG   ____________________________________________  EKG  ED ECG REPORT I, Nita Sicklearolina Meilin Brosh, the attending physician, personally viewed and interpreted this ECG.  Normal sinus rhythm, rate of 83, normal intervals, right axis deviation, T-wave inversion in inferior and lateral leads, no ST elevation. unchanged from prior from July 2018 ____________________________________________  RADIOLOGY  CXR: Mildly increased interstitial markings with chronic appearance. Bibasilar atelectasis. ____________________________________________   PROCEDURES  Procedure(s) performed: None Procedures Critical Care performed:  None ____________________________________________   INITIAL IMPRESSION / ASSESSMENT AND PLAN / ED COURSE  61 y.o. female with a h/o COPD, HTN, smoking who presents for evaluation of SOb, wheezing, subjective fever, chills, wheezing, diarrhea, and cough. patient is well-appearing, in, no respiratory distress, normal sats, diminished air movement bilaterally with faint expiratory wheezes. Chest x-ray with no evidence of pneumonia. labs show leukocytosis with white count of 15.4 and hyponatremia with sodium of 128. Ddx including Legionella PNA  vs Flu vs CA-PNA vs viral URI vs COPD exacerbation. Will check urine Legionella antigen, Flu, will give duonebs x 3, solumedrol and will start patient on abx.    _________________________ 3:16 PM on 09/18/2017 -----------------------------------------  Clinically and based on labs concerning for early pneumonia. Legionella pending. Flu negative. Patient feels markedly improved, moving great air. No hypoxia at rest and with ambulation. Patient received rocephin and azithro. Will send home on augmentin, azithro, prednisone, albuterol and close follow-up with primary care doctor/pulmonologist on Monday. Discussed return precautions with patient and her family members.   As part of my medical decision making, I reviewed the following data within the electronic MEDICAL RECORD NUMBER Nursing notes reviewed and incorporated, Labs reviewed , EKG interpreted , Old EKG reviewed, Radiograph reviewed , Notes from prior ED visits and [place] Controlled Substance Database    Pertinent labs & imaging results that were available during my care of the patient were reviewed by me and considered in my medical decision making (see chart for details).    ____________________________________________   FINAL CLINICAL IMPRESSION(S) / ED DIAGNOSES  Final diagnoses:  Community acquired pneumonia, unspecified laterality  COPD exacerbation (HCC)      NEW MEDICATIONS STARTED DURING THIS VISIT:  ED Discharge Orders        Ordered    albuterol (PROVENTIL HFA;VENTOLIN HFA) 108 (90 Base) MCG/ACT inhaler  Every 6 hours PRN     09/18/17 1514    predniSONE (DELTASONE) 20 MG tablet  Daily     09/18/17 1514    amoxicillin-clavulanate (AUGMENTIN) 875-125 MG tablet  2 times daily     09/18/17 1514    azithromycin (ZITHROMAX) 250 MG tablet     09/18/17 1514       Note:  This document was prepared using Dragon voice recognition software and may include unintentional dictation errors.    Nita SickleVeronese, [place], MD 09/18/17  [phone]

## 2017-09-18 NOTE — ED Notes (Signed)
Patient ambulated to nursing station and back O2 sats maintained between 93-96%, MD made aware.

## 2017-09-18 NOTE — Discharge Instructions (Signed)

## 2017-09-18 NOTE — ED Notes (Signed)
First Nurse Note:  Complaining of SHOB.  RR-26, Pulse ox 96% on room air.  Daughter states that patient recently had a breathing treatment.  Alert and oriented.

## 2017-09-18 NOTE — ED Triage Notes (Signed)
Arrives with c/o cough x 2 days.  States coughing worsening.

## 2017-09-18 NOTE — ED Notes (Addendum)
Patient unable to void at this time, MD made aware.

## 2017-09-19 LAB — LEGIONELLA PNEUMOPHILA SEROGP 1 UR AG: L. pneumophila Serogp 1 Ur Ag: NEGATIVE

## 2017-09-23 ENCOUNTER — Other Ambulatory Visit: Payer: Self-pay | Admitting: Nurse Practitioner

## 2017-09-23 MED ORDER — LOSARTAN POTASSIUM 25 MG PO TABS: 25.0000 mg | ORAL_TABLET | Freq: Two times a day (BID) | ORAL | 6 refills | Status: DC

## 2017-09-24 ENCOUNTER — Other Ambulatory Visit: Payer: Self-pay | Admitting: Nurse Practitioner

## 2017-09-24 ENCOUNTER — Encounter: Payer: Self-pay | Admitting: Internal Medicine

## 2017-09-24 ENCOUNTER — Ambulatory Visit (INDEPENDENT_AMBULATORY_CARE_PROVIDER_SITE_OTHER): Payer: Medicare Other | Admitting: Internal Medicine

## 2017-09-24 VITALS — BP 142/90 | HR 80 | Resp 16 | Ht 65.0 in | Wt 170.8 lb

## 2017-09-24 DIAGNOSIS — J440 Chronic obstructive pulmonary disease with acute lower respiratory infection: Secondary | ICD-10-CM

## 2017-09-24 DIAGNOSIS — R059 Cough, unspecified: Secondary | ICD-10-CM

## 2017-09-24 DIAGNOSIS — R05 Cough: Secondary | ICD-10-CM

## 2017-09-24 MED ORDER — PREDNISONE 10 MG (21) PO TBPK: ORAL_TABLET | ORAL | 0 refills | Status: DC

## 2017-09-24 MED ORDER — IPRATROPIUM-ALBUTEROL 0.5-2.5 (3) MG/3ML IN SOLN: 3.0000 mL | Freq: Once | RESPIRATORY_TRACT | Status: DC

## 2017-09-24 NOTE — Progress Notes (Signed)
[facility] [place], [place] [address]  Pulmonary Sleep Medicine  Office Visit Note  Patient Name: Alicia Rivera DOB: 06-22-1956 MRN [id number]  Date of Service: 09/24/2017     Complaints/HPI: She was in the ED for cough and congestion on 12/14 she was given Augmentin and also was given azithromycin. Patient states that she was also given steroids. She states that she is not quite back to baseline but is better. Her CXR had shown some chronic bronchitis with no pneumonia. She has a little bit of a cough.  On occasion she says she is still having some wheezing.  Denies any chest pain there is no palpitations.  No fevers noted.  No ankle edema is noted.  Current Medication: Outpatient Encounter Medications as of 09/24/2017  Medication Sig  . albuterol (PROVENTIL HFA;VENTOLIN HFA) 108 (90 Base) MCG/ACT inhaler Inhale 2 puffs into the lungs every 6 (six) hours as needed for wheezing or shortness of breath.  . ALPRAZolam (XANAX) 0.25 MG tablet Take 0.25 mg by mouth 2 (two) times daily as needed for anxiety.  Marland Kitchen DALIRESP 500 MCG TABS tablet Take 1 tablet by mouth daily.  . DULoxetine (CYMBALTA) 20 MG capsule Take 2 capsules by mouth every morning.  . hydrochlorothiazide (HYDRODIURIL) 12.5 MG tablet Take 1 tablet by mouth daily.  Marland Kitchen ipratropium-albuterol (DUONEB) 0.5-2.5 (3) MG/3ML SOLN Take 3 mLs by nebulization every 6 (six) hours as needed.  Marland Kitchen losartan (COZAAR) 25 MG tablet Take 1 tablet (25 mg total) by mouth 2 (two) times daily.  . montelukast (SINGULAIR) 10 MG tablet Take 1 tablet by mouth daily.  Marland Kitchen SPIRIVA HANDIHALER 18 MCG inhalation capsule Place 1 capsule (18 mcg total) into inhaler and inhale daily.  . SYMBICORT 160-4.5 MCG/ACT inhaler Inhale 1 puff into the lungs 2 (two) times daily.  . [DISCONTINUED] PROAIR HFA 108 (90 Base) MCG/ACT inhaler Inhale 1 puff into the lungs every 4 (four) hours as needed.  Marland Kitchen amoxicillin-clavulanate (AUGMENTIN) 875-125 MG  tablet Take 1 tablet by mouth 2 (two) times daily for 10 days. (Patient not taking: Reported on 09/24/2017)  . azithromycin (ZITHROMAX) 250 MG tablet Take 1 a day for 4 days (Patient not taking: Reported on 09/24/2017)  . buPROPion (WELLBUTRIN XL) 150 MG 24 hr tablet Take 1 tablet (150 mg total) by mouth 2 (two) times daily. (Patient not taking: Reported on 09/18/2017)  . busPIRone (BUSPAR) 10 MG tablet Take 1 tablet by mouth at bedtime.  . clonazePAM (KLONOPIN) 1 MG tablet Take 1 tablet by mouth 2 (two) times daily.  Marland Kitchen LORazepam (ATIVAN) 1 MG tablet Take 1 tablet (1 mg total) by mouth daily as needed. (Patient not taking: Reported on 09/18/2017)   No facility-administered encounter medications on file as of 09/24/2017.     Surgical History: Past Surgical History:  Procedure Laterality Date  . none      Medical History: Past Medical History:  Diagnosis Date  . COPD (chronic obstructive pulmonary disease) ([place])   . HTN (hypertension)   . Pneumonia 09/18/2017  . Tobacco abuse     Family History: Family History  Problem Relation Age of Onset  . Emphysema Brother     Social History: Social History   Socioeconomic History  . Marital status: Single    Spouse name: Not on file  . Number of children: Not on file  . Years of education: Not on file  . Highest education level: Not on file  Social Needs  . Emergency planning/management officer  strain: Not on file  . Food insecurity - worry: Not on file  . Food insecurity - inability: Not on file  . Transportation needs - medical: Not on file  . Transportation needs - non-medical: Not on file  Occupational History  . Not on file  Tobacco Use  . Smoking status: Former Research scientist (life sciences)  . Smokeless tobacco: Current User  . Tobacco comment: vape  Substance and Sexual Activity  . Alcohol use: No  . Drug use: No  . Sexual activity: No  Other Topics Concern  . Not on file  Social History Narrative  . Not on file     ROS  General: (-) fever, (-)  weakness, (-) changes in appetite. Skin: (-) rashes, (-) itching,. Eyes: (-) redness, (-) itching, (-) double or blurred vision. Nose and Sinuses: (-) postnasal drip, (-) nosebleeds, (-) sinus trouble. Mouth and Throat: (-) sore throat, (-) hoarseness. Neck: (-) swollen glands, (-) enlarged thyroid, (-) neck pain. Respiratory: + cough, (-) bloody sputum, +shortness of breath, +wheezing. Cardiovascular: (-) ankle swelling, (-) chest pain. Lymphatic: (-) lymph node enlargement, (-) lymph node tenderness. Neurologic: (-) numbness, (-) tingling,(-) dizziness. Psychiatric: (-) anxiety, (-) depression.  Vital Signs: Blood pressure (!) 142/90, pulse 80, resp. rate 16, height '5\' 5"'$  (1.651 m), weight 170 lb 12.8 oz (77.5 kg), SpO2 92 %.  Examination: General Appearance: The patient is well-developed, well-nourished, and in no distress. Skin: Gross inspection of skin demonstrates no evidence of abnormality. Head: Patient's head is normocephalic, no gross deformities. Eyes: no gross deformities noted. ENT: ears appear grossly normal. Nasopharynx appears to be normal. Neck: Supple. No thyromegaly. No LAD. Respiratory: Lungs are clear except for occasional wheeze noted on the left base. Cardiovascular: Normal S1 and S2 without murmur or rub. Extremities: No cyanosis. pulses are equal. Neurologic: Alert and oriented. No involuntary movements.  LABS: Recent Results (from the past 2160 hour(s))  CBC with Differential     Status: Abnormal   Collection Time: 09/18/17 10:21 AM  Result Value Ref Range   WBC 15.4 (H) 3.6 - 11.0 K/uL   RBC 4.31 3.80 - 5.20 MIL/uL   Hemoglobin 13.8 12.0 - 16.0 g/dL   HCT 39.1 35.0 - 47.0 %   MCV 90.7 80.0 - 100.0 fL   MCH 31.9 26.0 - 34.0 pg   MCHC 35.2 32.0 - 36.0 g/dL   RDW 13.4 11.5 - 14.5 %   Platelets 321 150 - 440 K/uL   Neutrophils Relative % 82 %   Neutro Abs 12.5 (H) 1.4 - 6.5 K/uL   Lymphocytes Relative 8 %   Lymphs Abs 1.2 1.0 - 3.6 K/uL   Monocytes  Relative 9 %   Monocytes Absolute 1.4 (H) 0.2 - 0.9 K/uL   Eosinophils Relative 1 %   Eosinophils Absolute 0.1 0 - 0.7 K/uL   Basophils Relative 0 %   Basophils Absolute 0.0 0 - 0.1 K/uL  Comprehensive metabolic panel     Status: Abnormal   Collection Time: 09/18/17 10:21 AM  Result Value Ref Range   Sodium 128 (L) 135 - 145 mmol/L   Potassium 3.2 (L) 3.5 - 5.1 mmol/L   Chloride 92 (L) 101 - 111 mmol/L   CO2 23 22 - 32 mmol/L   Glucose, Bld 130 (H) 65 - 99 mg/dL   BUN 11 6 - 20 mg/dL   Creatinine, Ser 0.92 0.44 - 1.00 mg/dL   Calcium 9.3 8.9 - 10.3 mg/dL   Total Protein 8.1 6.5 - 8.1  g/dL   Albumin 4.4 3.5 - 5.0 g/dL   AST 20 15 - 41 U/L   ALT 15 14 - 54 U/L   Alkaline Phosphatase 84 38 - 126 U/L   Total Bilirubin 0.8 0.3 - 1.2 mg/dL   GFR calc non Af Amer >60 >60 mL/min   GFR calc Af Amer >60 >60 mL/min    Comment: (NOTE) The eGFR has been calculated using the CKD EPI equation. This calculation has not been validated in all clinical situations. eGFR's persistently <60 mL/min signify possible Chronic Kidney Disease.    Anion gap 13 5 - 15  Troponin I     Status: None   Collection Time: 09/18/17 10:21 AM  Result Value Ref Range   Troponin I <0.03 <0.03 ng/mL  Influenza panel by PCR (type A & B)     Status: None   Collection Time: 09/18/17 11:53 AM  Result Value Ref Range   Influenza A By PCR NEGATIVE NEGATIVE   Influenza B By PCR NEGATIVE NEGATIVE    Comment: (NOTE) The Xpert Xpress Flu assay is intended as an aid in the diagnosis of  influenza and should not be used as a sole basis for treatment.  This  assay is FDA approved for nasopharyngeal swab specimens only. Nasal  washings and aspirates are unacceptable for Xpert Xpress Flu testing.   Legionella Pneumophila Serogp 1 Ur Ag     Status: None   Collection Time: 09/18/17  2:06 PM  Result Value Ref Range   L. pneumophila Serogp 1 Ur Ag Negative Negative    Comment: (NOTE) Presumptive negative for L. pneumophila  serogroup 1 antigen in urine, suggesting no recent or current infection. Legionnaires' disease cannot be ruled out since other serogroups and species may also cause disease. Performed At: [facility] [place], [place] [id number] Rush Farmer MD WU:[id number]    Source of Sample URINE, RANDOM     Radiology: Dg Chest 2 View  Result Date: 09/18/2017 CLINICAL DATA:  Shortness of breath. EXAM: CHEST  2 VIEW COMPARISON:  04/20/2017 FINDINGS: Interval removal of left IJ approach central venous catheter. No evidence of pneumothorax. Cardiomediastinal silhouette is normal. Mediastinal contours appear intact. Calcific atherosclerotic disease of the aorta. Mildly increased interstitial markings.  Bibasilar atelectasis. Osseous structures are without acute abnormality. Soft tissues are grossly normal. IMPRESSION: Mildly increased interstitial markings with chronic appearance. Bibasilar atelectasis. Electronically Signed   By: Fidela Salisbury M.D.   On: 09/18/2017 10:50    No results found.  Dg Chest 2 View  Result Date: 09/18/2017 CLINICAL DATA:  Shortness of breath. EXAM: CHEST  2 VIEW COMPARISON:  04/20/2017 FINDINGS: Interval removal of left IJ approach central venous catheter. No evidence of pneumothorax. Cardiomediastinal silhouette is normal. Mediastinal contours appear intact. Calcific atherosclerotic disease of the aorta. Mildly increased interstitial markings.  Bibasilar atelectasis. Osseous structures are without acute abnormality. Soft tissues are grossly normal. IMPRESSION: Mildly increased interstitial markings with chronic appearance. Bibasilar atelectasis. Electronically Signed   By: Fidela Salisbury M.D.   On: 09/18/2017 10:50      Assessment and Plan: Patient Active Problem List   Diagnosis Date Noted  . Smoker   . Acute encephalopathy   . Acute respiratory failure with hypoxemia ([place]) 04/16/2017  . COPD exacerbation ([place])   . Palliative care by  specialist   . Goals of care, counseling/discussion     1.  COPD with acute exacerbation  she is doing better clinically may still not quite  back to baseline Suggested adding some prednisone as an outpatient to see if that will help her  We will continue with other nebulizers and therapy  Prescription for her DuoNeb was given today  2. Cough  as noted will need lobe of prolonged period of prednisone will continue to monitor closely   General Counseling: I have discussed the findings of the evaluation and examination with Judieth.  I have also discussed any further diagnostic evaluation thatmay be needed or ordered today. Jelisha verbalizes understanding of the findings of todays visit. We also reviewed her medications today and discussed drug interactions and side effects including but not limited excessive drowsiness and altered mental states. We also discussed that there is always a risk not just to her but also people around her. she has been encouraged to call the office with any questions or concerns that should arise related to todays visit.    Time spent: 81mn  I have personally obtained a history, examined the patient, evaluated laboratory and imaging results, formulated the assessment and plan and placed orders.    SAllyne Gee MD FSurgery Center Of Bone And Joint InstitutePulmonary and Critical Care Sleep medicine

## 2017-09-24 NOTE — Patient Instructions (Signed)

## 2017-10-04 ENCOUNTER — Other Ambulatory Visit: Payer: Self-pay | Admitting: Internal Medicine

## 2017-10-05 ENCOUNTER — Other Ambulatory Visit: Payer: Self-pay | Admitting: Internal Medicine

## 2017-10-09 ENCOUNTER — Other Ambulatory Visit: Payer: Self-pay

## 2017-10-09 MED ORDER — IPRATROPIUM-ALBUTEROL 0.5-2.5 (3) MG/3ML IN SOLN: 3.0000 mL | Freq: Four times a day (QID) | RESPIRATORY_TRACT | 0 refills | Status: DC | PRN

## 2017-10-30 ENCOUNTER — Other Ambulatory Visit: Payer: Self-pay

## 2017-10-30 MED ORDER — IPRATROPIUM-ALBUTEROL 0.5-2.5 (3) MG/3ML IN SOLN: 3.0000 mL | Freq: Four times a day (QID) | RESPIRATORY_TRACT | 0 refills | Status: DC | PRN

## 2017-11-14 DIAGNOSIS — J441 Chronic obstructive pulmonary disease with (acute) exacerbation: Secondary | ICD-10-CM | POA: Diagnosis not present

## 2017-11-14 DIAGNOSIS — Z79899 Other long term (current) drug therapy: Secondary | ICD-10-CM | POA: Diagnosis not present

## 2017-11-14 DIAGNOSIS — R0602 Shortness of breath: Secondary | ICD-10-CM | POA: Diagnosis not present

## 2017-11-14 DIAGNOSIS — R079 Chest pain, unspecified: Secondary | ICD-10-CM | POA: Diagnosis not present

## 2017-11-14 DIAGNOSIS — Z7951 Long term (current) use of inhaled steroids: Secondary | ICD-10-CM | POA: Diagnosis not present

## 2017-11-14 DIAGNOSIS — I10 Essential (primary) hypertension: Secondary | ICD-10-CM | POA: Diagnosis not present

## 2017-11-14 DIAGNOSIS — Z882 Allergy status to sulfonamides status: Secondary | ICD-10-CM | POA: Diagnosis not present

## 2017-11-14 DIAGNOSIS — R0789 Other chest pain: Secondary | ICD-10-CM | POA: Diagnosis not present

## 2017-11-14 DIAGNOSIS — F17210 Nicotine dependence, cigarettes, uncomplicated: Secondary | ICD-10-CM | POA: Diagnosis not present

## 2017-11-14 DIAGNOSIS — Z8673 Personal history of transient ischemic attack (TIA), and cerebral infarction without residual deficits: Secondary | ICD-10-CM | POA: Diagnosis not present

## 2017-11-16 ENCOUNTER — Ambulatory Visit (INDEPENDENT_AMBULATORY_CARE_PROVIDER_SITE_OTHER): Payer: Medicare Other | Admitting: Internal Medicine

## 2017-11-16 ENCOUNTER — Encounter: Payer: Self-pay | Admitting: Internal Medicine

## 2017-11-16 ENCOUNTER — Other Ambulatory Visit: Payer: Self-pay

## 2017-11-16 VITALS — BP 142/82 | HR 72 | Resp 16 | Ht 65.0 in | Wt 179.2 lb

## 2017-11-16 DIAGNOSIS — F17218 Nicotine dependence, cigarettes, with other nicotine-induced disorders: Secondary | ICD-10-CM | POA: Diagnosis not present

## 2017-11-16 DIAGNOSIS — J9610 Chronic respiratory failure, unspecified whether with hypoxia or hypercapnia: Secondary | ICD-10-CM

## 2017-11-16 DIAGNOSIS — J209 Acute bronchitis, unspecified: Secondary | ICD-10-CM

## 2017-11-16 DIAGNOSIS — J440 Chronic obstructive pulmonary disease with acute lower respiratory infection: Secondary | ICD-10-CM

## 2017-11-16 DIAGNOSIS — J449 Chronic obstructive pulmonary disease, unspecified: Secondary | ICD-10-CM | POA: Diagnosis not present

## 2017-11-16 MED ORDER — IPRATROPIUM BROMIDE 0.02 % IN SOLN
500.00 | RESPIRATORY_TRACT | Status: DC
Start: ? — End: 2017-11-16

## 2017-11-16 MED ORDER — ALBUTEROL SULFATE (5 MG/ML) 0.5% IN NEBU
20.00 mg | INHALATION_SOLUTION | RESPIRATORY_TRACT | Status: DC
Start: ? — End: 2017-11-16

## 2017-11-16 NOTE — Progress Notes (Signed)
[facility] [place], [place] [address]  Pulmonary Sleep Medicine  Office Visit Note  Patient Name: Alicia Rivera DOB: 12/31/1955 MRN [id number]  Date of Service: 11/16/2017  Complaints/HPI: She was in the ED with a flare of her COPD. She is still smoking. Patient states that she is cutting down. She states she was given doxy and this did seem to help along with steroid injection. She has some cough which is improving at this time. No fevers or chills noted denies having any chest pain some tightness is noted  ROS  General: (-) fever, (-) chills, (-) night sweats, (-) weakness Skin: (-) rashes, (-) itching,. Eyes: (-) visual changes, (-) redness, (-) itching. Nose and Sinuses: (-) nasal stuffiness or itchiness, (-) postnasal drip, (-) nosebleeds, (-) sinus trouble. Mouth and Throat: (-) sore throat, (-) hoarseness. Neck: (-) swollen glands, (-) enlarged thyroid, (-) neck pain. Respiratory: + cough, (-) bloody sputum, + shortness of breath, - wheezing. Cardiovascular: - ankle swelling, (-) chest pain. Lymphatic: (-) lymph node enlargement. Neurologic: (-) numbness, (-) tingling. Psychiatric: (-) anxiety, (-) depression   Current Medication: Outpatient Encounter Medications as of 11/16/2017  Medication Sig  . albuterol (PROVENTIL HFA;VENTOLIN HFA) 108 (90 Base) MCG/ACT inhaler Inhale 2 puffs into the lungs every 6 (six) hours as needed for wheezing or shortness of breath.  . ALPRAZolam (XANAX) 0.25 MG tablet Take 0.25 mg by mouth 2 (two) times daily as needed for anxiety.  Marland Kitchen azithromycin (ZITHROMAX) 250 MG tablet Take 1 a day for 4 days (Patient not taking: Reported on 09/24/2017)  . buPROPion (WELLBUTRIN XL) 150 MG 24 hr tablet Take 1 tablet (150 mg total) by mouth 2 (two) times daily. (Patient not taking: Reported on 09/18/2017)  . busPIRone (BUSPAR) 10 MG tablet Take 1 tablet by mouth at bedtime.  . clonazePAM (KLONOPIN) 1 MG tablet Take 1 tablet by mouth  2 (two) times daily.  Marland Kitchen DALIRESP 500 MCG TABS tablet Take 1 tablet by mouth daily.  Marland Kitchen doxycycline (VIBRAMYCIN) 100 MG capsule TK ONE C PO BID FOR 10 DAYS  . DULoxetine (CYMBALTA) 20 MG capsule Take 2 capsules by mouth every morning.  . hydrochlorothiazide (HYDRODIURIL) 12.5 MG tablet Take 1 tablet by mouth daily.  Marland Kitchen ipratropium-albuterol (DUONEB) 0.5-2.5 (3) MG/3ML SOLN Take 3 mLs by nebulization every 6 (six) hours as needed.  Marland Kitchen LORazepam (ATIVAN) 1 MG tablet Take 1 tablet (1 mg total) by mouth daily as needed. (Patient not taking: Reported on 09/18/2017)  . losartan (COZAAR) 25 MG tablet Take 1 tablet (25 mg total) by mouth 2 (two) times daily.  . montelukast (SINGULAIR) 10 MG tablet TAKE 1 TABLET BY MOUTH EVERY DAY  . predniSONE (STERAPRED UNI-PAK 21 TAB) 10 MG (21) TBPK tablet Take dose pack as directed for 7 days  . SPIRIVA HANDIHALER 18 MCG inhalation capsule Place 1 capsule (18 mcg total) into inhaler and inhale daily.  . SYMBICORT 160-4.5 MCG/ACT inhaler Inhale 1 puff into the lungs 2 (two) times daily.   Facility-Administered Encounter Medications as of 11/16/2017  Medication  . ipratropium-albuterol (DUONEB) 0.5-2.5 (3) MG/3ML nebulizer solution 3 mL  . [DISCONTINUED] albuterol (PROVENTIL) (5 MG/ML) 0.5% nebulizer solution  . [DISCONTINUED] ipratropium (ATROVENT) nebulizer solution    Surgical History: Past Surgical History:  Procedure Laterality Date  . none      Medical History: Past Medical History:  Diagnosis Date  . COPD (chronic obstructive pulmonary disease) ([place])   . HTN (hypertension)   .  Pneumonia 09/18/2017  . Tobacco abuse     Family History: Family History  Problem Relation Age of Onset  . Emphysema Brother     Social History: Social History   Socioeconomic History  . Marital status: Single    Spouse name: Not on file  . Number of children: Not on file  . Years of education: Not on file  . Highest education level: Not on file  Social Needs  .  Financial resource strain: Not on file  . Food insecurity - worry: Not on file  . Food insecurity - inability: Not on file  . Transportation needs - medical: Not on file  . Transportation needs - non-medical: Not on file  Occupational History  . Not on file  Tobacco Use  . Smoking status: Former Research scientist (life sciences)  . Smokeless tobacco: Current User  . Tobacco comment: vape  Substance and Sexual Activity  . Alcohol use: No  . Drug use: No  . Sexual activity: No  Other Topics Concern  . Not on file  Social History Narrative  . Not on file    Vital Signs: Blood pressure (!) 142/82, pulse 72, resp. rate 16, height '5\' 5"'$  (1.651 m), weight 179 lb 3.2 oz (81.3 kg), SpO2 94 %.  Examination: General Appearance: The patient is well-developed, well-nourished, and in no distress. Skin: Gross inspection of skin unremarkable. Head: normocephalic, no gross deformities. Eyes: no gross deformities noted. ENT: ears appear grossly normal no exudates. Neck: Supple. No thyromegaly. No LAD. Respiratory: appears to be clear no rhonchi. Cardiovascular: Normal S1 and S2 without murmur or rub. Extremities: No cyanosis. pulses are equal. Neurologic: Alert and oriented. No involuntary movements.  LABS: Recent Results (from the past 2160 hour(s))  CBC with Differential     Status: Abnormal   Collection Time: 09/18/17 10:21 AM  Result Value Ref Range   WBC 15.4 (H) 3.6 - 11.0 K/uL   RBC 4.31 3.80 - 5.20 MIL/uL   Hemoglobin 13.8 12.0 - 16.0 g/dL   HCT 39.1 35.0 - 47.0 %   MCV 90.7 80.0 - 100.0 fL   MCH 31.9 26.0 - 34.0 pg   MCHC 35.2 32.0 - 36.0 g/dL   RDW 13.4 11.5 - 14.5 %   Platelets 321 150 - 440 K/uL   Neutrophils Relative % 82 %   Neutro Abs 12.5 (H) 1.4 - 6.5 K/uL   Lymphocytes Relative 8 %   Lymphs Abs 1.2 1.0 - 3.6 K/uL   Monocytes Relative 9 %   Monocytes Absolute 1.4 (H) 0.2 - 0.9 K/uL   Eosinophils Relative 1 %   Eosinophils Absolute 0.1 0 - 0.7 K/uL   Basophils Relative 0 %   Basophils  Absolute 0.0 0 - 0.1 K/uL  Comprehensive metabolic panel     Status: Abnormal   Collection Time: 09/18/17 10:21 AM  Result Value Ref Range   Sodium 128 (L) 135 - 145 mmol/L   Potassium 3.2 (L) 3.5 - 5.1 mmol/L   Chloride 92 (L) 101 - 111 mmol/L   CO2 23 22 - 32 mmol/L   Glucose, Bld 130 (H) 65 - 99 mg/dL   BUN 11 6 - 20 mg/dL   Creatinine, Ser 0.92 0.44 - 1.00 mg/dL   Calcium 9.3 8.9 - 10.3 mg/dL   Total Protein 8.1 6.5 - 8.1 g/dL   Albumin 4.4 3.5 - 5.0 g/dL   AST 20 15 - 41 U/L   ALT 15 14 - 54 U/L   Alkaline Phosphatase 84  38 - 126 U/L   Total Bilirubin 0.8 0.3 - 1.2 mg/dL   GFR calc non Af Amer >60 >60 mL/min   GFR calc Af Amer >60 >60 mL/min    Comment: (NOTE) The eGFR has been calculated using the CKD EPI equation. This calculation has not been validated in all clinical situations. eGFR's persistently <60 mL/min signify possible Chronic Kidney Disease.    Anion gap 13 5 - 15  Troponin I     Status: None   Collection Time: 09/18/17 10:21 AM  Result Value Ref Range   Troponin I <0.03 <0.03 ng/mL  Influenza panel by PCR (type A & B)     Status: None   Collection Time: 09/18/17 11:53 AM  Result Value Ref Range   Influenza A By PCR NEGATIVE NEGATIVE   Influenza B By PCR NEGATIVE NEGATIVE    Comment: (NOTE) The Xpert Xpress Flu assay is intended as an aid in the diagnosis of  influenza and should not be used as a sole basis for treatment.  This  assay is FDA approved for nasopharyngeal swab specimens only. Nasal  washings and aspirates are unacceptable for Xpert Xpress Flu testing.   Legionella Pneumophila Serogp 1 Ur Ag     Status: None   Collection Time: 09/18/17  2:06 PM  Result Value Ref Range   L. pneumophila Serogp 1 Ur Ag Negative Negative    Comment: (NOTE) Presumptive negative for L. pneumophila serogroup 1 antigen in urine, suggesting no recent or current infection. Legionnaires' disease cannot be ruled out since other serogroups and species may also cause  disease. Performed At: [facility] [place], [place] [id number] Rush Farmer MD [id number]    Source of Sample URINE, RANDOM     Radiology: Dg Chest 2 View  Result Date: 09/18/2017 CLINICAL DATA:  Shortness of breath. EXAM: CHEST  2 VIEW COMPARISON:  04/20/2017 FINDINGS: Interval removal of left IJ approach central venous catheter. No evidence of pneumothorax. Cardiomediastinal silhouette is normal. Mediastinal contours appear intact. Calcific atherosclerotic disease of the aorta. Mildly increased interstitial markings.  Bibasilar atelectasis. Osseous structures are without acute abnormality. Soft tissues are grossly normal. IMPRESSION: Mildly increased interstitial markings with chronic appearance. Bibasilar atelectasis. Electronically Signed   By: Fidela Salisbury M.D.   On: 09/18/2017 10:50    No results found.  No results found.    Assessment and Plan: Patient Active Problem List   Diagnosis Date Noted  . Smoker   . Acute encephalopathy   . Acute respiratory failure with hypoxemia ([place]) 04/16/2017  . COPD exacerbation ([place])   . Palliative care by specialist   . Goals of care, counseling/discussion   . Respiratory insufficiency 01/14/2014  . Hypoxemia 01/14/2014    1. COPD probable mild flare noted will continue with inhalers and present management 2. Ongoing smoker again needs to stop smoking 3. Chronic respiratory failure she is not on oxygen at this time 4. Acute bronchitis finish off doxy  General Counseling: I have discussed the findings of the evaluation and examination with Starlit.  I have also discussed any further diagnostic evaluation thatmay be needed or ordered today. Aalayah verbalizes understanding of the findings of todays visit. We also reviewed her medications today and discussed drug interactions and side effects including but not limited excessive drowsiness and altered mental states. We also discussed that there is always a  risk not just to her but also people around her. she has been encouraged to call the office with  any questions or concerns that should arise related to todays visit.    Time spent: 90mn  I have personally obtained a history, examined the patient, evaluated laboratory and imaging results, formulated the assessment and plan and placed orders.    SAllyne Gee MD FGrove City Surgery Center LLCPulmonary and Critical Care Sleep medicine

## 2017-11-16 NOTE — Patient Instructions (Signed)

## 2017-11-23 ENCOUNTER — Ambulatory Visit (INDEPENDENT_AMBULATORY_CARE_PROVIDER_SITE_OTHER): Payer: Medicare Other | Admitting: Internal Medicine

## 2017-11-23 ENCOUNTER — Encounter: Payer: Self-pay | Admitting: Internal Medicine

## 2017-11-23 VITALS — BP 148/100 | HR 86 | Resp 16 | Ht 65.0 in | Wt 178.2 lb

## 2017-11-23 DIAGNOSIS — J449 Chronic obstructive pulmonary disease, unspecified: Secondary | ICD-10-CM

## 2017-11-23 DIAGNOSIS — F419 Anxiety disorder, unspecified: Secondary | ICD-10-CM | POA: Diagnosis not present

## 2017-11-23 DIAGNOSIS — R0602 Shortness of breath: Secondary | ICD-10-CM

## 2017-11-23 DIAGNOSIS — F17200 Nicotine dependence, unspecified, uncomplicated: Secondary | ICD-10-CM

## 2017-11-23 MED ORDER — PREDNISONE 10 MG (21) PO TBPK: ORAL_TABLET | ORAL | 0 refills | Status: DC

## 2017-11-23 NOTE — Patient Instructions (Signed)
CT Angiogram A CT angiogram is a procedure to look at the blood vessels in various areas of the body. For this procedure, a large X-ray machine, called a CT scanner, takes detailed pictures of blood vessels that have been injected with a dye (contrast material). A CT angiogram allows your health care provider to see how well blood is flowing to the area of your body that is being checked. Your health care provider will be able to see if there are any problems, such as a blockage. Tell a health care provider about:  Any allergies you have.  All medicines you are taking, including vitamins, herbs, eye drops, creams, and over-the-counter medicines.  Any problems you or family members have had with anesthetic medicines.  Any blood disorders you have.  Any surgeries you have had.  Any medical conditions you have.  Whether you are pregnant or may be pregnant.  Whether you are breastfeeding.  Any anxiety disorders, chronic pain, or other conditions you have that may increase your stress or prevent you from lying still. What are the risks? Generally, this is a safe procedure. However, problems may occur, including:  Infection.  Bleeding.  Allergic reactions to medicines or dyes.  Damage to other structures or organs.  Kidney damage from the dye or contrast that is used.  Increased risk of cancer from radiation exposure. This risk is low. Talk with your health care provider about: ? The risks and benefits of testing. ? How you can receive the lowest dose of radiation.  What happens before the procedure?  Wear comfortable clothing and remove any jewelry.  Follow instructions from your health care provider about eating and drinking. For most people, instructions may include these actions: ? For 12 hours before the test, avoid caffeine. This includes tea, coffee, soda, and energy drinks or pills. ? For 3-4 hours before the test, stop eating or drinking anything but water. ? Stay  well hydrated by continuing to drink water before the exam. This will help to clear the contrast dye from your body after the test.  Ask your health care provider about changing or stopping your regular medicines. This is especially important if you are taking diabetes medicines or blood thinners. What happens during the procedure?  An IV tube will be inserted into one of your veins.  You will be asked to lie on an exam table. This table will slide in and out of the CT machine during the procedure.  Contrast dye will be injected into the IV tube. You might feel warm, or you may get a metallic taste in your mouth.  The table that you are lying on will move into the CT machine tunnel for the scan.  The person running the machine will give you instructions while the scans are being done. You may be asked to: ? Keep your arms above your head. ? Hold your breath. ? Stay very still, even if the table is moving.  When the scanning is complete, you will be moved out of the machine.  The IV tube will be removed. The procedure may vary among health care providers and hospitals. What happens after the procedure?  You might feel warm, or you may get a metallic taste in your mouth.  You may be asked to drink water or other fluids to wash (flush) the contrast material out of your body.  It is up to you to get the results of your procedure. Ask your health care provider, or the department   that is doing the procedure, when your results will be ready. Summary  A CT angiogram is a procedure to look at the blood vessels in various areas of the body.  You will need to stay very still during the exam.  You may be asked to drink water or other fluids to wash (flush) the contrast material out of your body after your scan. This information is not intended to replace advice given to you by your health care provider. Make sure you discuss any questions you have with your health care provider. Document  Released: 05/22/2016 Document Revised: 05/22/2016 Document Reviewed: 05/22/2016 Elsevier Interactive Patient Education  2018 [employer]  

## 2017-11-23 NOTE — Progress Notes (Signed)
[facility] [place], [place] [address]  Pulmonary Sleep Medicine  Office Visit Note  Patient Name: Alicia Rivera DOB: 08/30/1956 MRN [id number]  Date of Service: 11/23/2017  Complaints/HPI: She states that she is still not quite back to baseline. She is still having difficulty with her breathing and has a hurting in her left lower chest. Patient states that she has no fevers or chills noted but has little energy noted. Denies having any cough or congestion no hemoptysis. Also has some nausea issues. She has no diarrhea noted. She states occasionally has chills noted. Last CXR was in decemeber my concern would be if there is some vascular issues.   ROS  General: (-) fever, (-) chills, (-) night sweats, (-) weakness Skin: (-) rashes, (-) itching,. Eyes: (-) visual changes, (-) redness, (-) itching. Nose and Sinuses: (-) nasal stuffiness or itchiness, (-) postnasal drip, (-) nosebleeds, (-) sinus trouble. Mouth and Throat: (-) sore throat, (-) hoarseness. Neck: (-) swollen glands, (-) enlarged thyroid, (-) neck pain. Respiratory: + cough, (-) bloody sputum, + shortness of breath, + wheezing. Cardiovascular: - ankle swelling, + chest pain. Lymphatic: (-) lymph node enlargement. Neurologic: (-) numbness, (-) tingling. Psychiatric: (-) anxiety, (-) depression   Current Medication: Outpatient Encounter Medications as of 11/23/2017  Medication Sig  . albuterol (PROVENTIL HFA;VENTOLIN HFA) 108 (90 Base) MCG/ACT inhaler Inhale 2 puffs into the lungs every 6 (six) hours as needed for wheezing or shortness of breath.  . ALPRAZolam (XANAX) 0.25 MG tablet Take 0.25 mg by mouth 2 (two) times daily as needed for anxiety.  Marland Kitchen DALIRESP 500 MCG TABS tablet Take 1 tablet by mouth daily.  Marland Kitchen doxycycline (VIBRAMYCIN) 100 MG capsule TK ONE C PO BID FOR 10 DAYS  . DULoxetine (CYMBALTA) 20 MG capsule Take 2 capsules by mouth every morning.  . hydrochlorothiazide (HYDRODIURIL) 12.5  MG tablet Take 1 tablet by mouth daily.  Marland Kitchen ipratropium-albuterol (DUONEB) 0.5-2.5 (3) MG/3ML SOLN Take 3 mLs by nebulization every 6 (six) hours as needed.  Marland Kitchen losartan (COZAAR) 25 MG tablet Take 1 tablet (25 mg total) by mouth 2 (two) times daily.  . montelukast (SINGULAIR) 10 MG tablet TAKE 1 TABLET BY MOUTH EVERY DAY  . SPIRIVA HANDIHALER 18 MCG inhalation capsule Place 1 capsule (18 mcg total) into inhaler and inhale daily.  . SYMBICORT 160-4.5 MCG/ACT inhaler Inhale 1 puff into the lungs 2 (two) times daily.  . predniSONE (STERAPRED UNI-PAK 21 TAB) 10 MG (21) TBPK tablet Take dose pack as directed for 7 days (Patient not taking: Reported on 11/23/2017)  . [DISCONTINUED] azithromycin (ZITHROMAX) 250 MG tablet Take 1 a day for 4 days (Patient not taking: Reported on 09/24/2017)  . [DISCONTINUED] buPROPion (WELLBUTRIN XL) 150 MG 24 hr tablet Take 1 tablet (150 mg total) by mouth 2 (two) times daily. (Patient not taking: Reported on 09/18/2017)  . [DISCONTINUED] busPIRone (BUSPAR) 10 MG tablet Take 1 tablet by mouth at bedtime.  . [DISCONTINUED] clonazePAM (KLONOPIN) 1 MG tablet Take 1 tablet by mouth 2 (two) times daily.  . [DISCONTINUED] LORazepam (ATIVAN) 1 MG tablet Take 1 tablet (1 mg total) by mouth daily as needed. (Patient not taking: Reported on 09/18/2017)   Facility-Administered Encounter Medications as of 11/23/2017  Medication  . ipratropium-albuterol (DUONEB) 0.5-2.5 (3) MG/3ML nebulizer solution 3 mL    Surgical History: Past Surgical History:  Procedure Laterality Date  . none      Medical History: Past Medical History:  Diagnosis Date  .  COPD (chronic obstructive pulmonary disease) ([place])   . HTN (hypertension)   . Pneumonia 09/18/2017  . Tobacco abuse     Family History: Family History  Problem Relation Age of Onset  . Emphysema Brother     Social History: Social History   Socioeconomic History  . Marital status: Single    Spouse name: Not on file  . Number  of children: Not on file  . Years of education: Not on file  . Highest education level: Not on file  Social Needs  . Financial resource strain: Not on file  . Food insecurity - worry: Not on file  . Food insecurity - inability: Not on file  . Transportation needs - medical: Not on file  . Transportation needs - non-medical: Not on file  Occupational History  . Not on file  Tobacco Use  . Smoking status: Former Research scientist (life sciences)  . Smokeless tobacco: Current User  . Tobacco comment: vape  Substance and Sexual Activity  . Alcohol use: No  . Drug use: No  . Sexual activity: No  Other Topics Concern  . Not on file  Social History Narrative  . Not on file    Vital Signs: Blood pressure (!) 148/100, pulse 86, resp. rate 16, height '5\' 5"'  (1.651 m), weight 178 lb 3.2 oz (80.8 kg), SpO2 93 %.  Examination: General Appearance: The patient is well-developed, well-nourished, and in no distress. Skin: Gross inspection of skin unremarkable. Head: normocephalic, no gross deformities. Eyes: no gross deformities noted. ENT: ears appear grossly normal no exudates. Neck: Supple. No thyromegaly. No LAD. Respiratory: No rhonchi noted. Cardiovascular: Normal S1 and S2 without murmur or rub. Extremities: No cyanosis. pulses are equal. Neurologic: Alert and oriented. No involuntary movements.  LABS: Recent Results (from the past 2160 hour(s))  CBC with Differential     Status: Abnormal   Collection Time: 09/18/17 10:21 AM  Result Value Ref Range   WBC 15.4 (H) 3.6 - 11.0 K/uL   RBC 4.31 3.80 - 5.20 MIL/uL   Hemoglobin 13.8 12.0 - 16.0 g/dL   HCT 39.1 35.0 - 47.0 %   MCV 90.7 80.0 - 100.0 fL   MCH 31.9 26.0 - 34.0 pg   MCHC 35.2 32.0 - 36.0 g/dL   RDW 13.4 11.5 - 14.5 %   Platelets 321 150 - 440 K/uL   Neutrophils Relative % 82 %   Neutro Abs 12.5 (H) 1.4 - 6.5 K/uL   Lymphocytes Relative 8 %   Lymphs Abs 1.2 1.0 - 3.6 K/uL   Monocytes Relative 9 %   Monocytes Absolute 1.4 (H) 0.2 - 0.9 K/uL    Eosinophils Relative 1 %   Eosinophils Absolute 0.1 0 - 0.7 K/uL   Basophils Relative 0 %   Basophils Absolute 0.0 0 - 0.1 K/uL  Comprehensive metabolic panel     Status: Abnormal   Collection Time: 09/18/17 10:21 AM  Result Value Ref Range   Sodium 128 (L) 135 - 145 mmol/L   Potassium 3.2 (L) 3.5 - 5.1 mmol/L   Chloride 92 (L) 101 - 111 mmol/L   CO2 23 22 - 32 mmol/L   Glucose, Bld 130 (H) 65 - 99 mg/dL   BUN 11 6 - 20 mg/dL   Creatinine, Ser 0.92 0.44 - 1.00 mg/dL   Calcium 9.3 8.9 - 10.3 mg/dL   Total Protein 8.1 6.5 - 8.1 g/dL   Albumin 4.4 3.5 - 5.0 g/dL   AST 20 15 - 41 U/L  ALT 15 14 - 54 U/L   Alkaline Phosphatase 84 38 - 126 U/L   Total Bilirubin 0.8 0.3 - 1.2 mg/dL   GFR calc non Af Amer >60 >60 mL/min   GFR calc Af Amer >60 >60 mL/min    Comment: (NOTE) The eGFR has been calculated using the CKD EPI equation. This calculation has not been validated in all clinical situations. eGFR's persistently <60 mL/min signify possible Chronic Kidney Disease.    Anion gap 13 5 - 15  Troponin I     Status: None   Collection Time: 09/18/17 10:21 AM  Result Value Ref Range   Troponin I <0.03 <0.03 ng/mL  Influenza panel by PCR (type A & B)     Status: None   Collection Time: 09/18/17 11:53 AM  Result Value Ref Range   Influenza A By PCR NEGATIVE NEGATIVE   Influenza B By PCR NEGATIVE NEGATIVE    Comment: (NOTE) The Xpert Xpress Flu assay is intended as an aid in the diagnosis of  influenza and should not be used as a sole basis for treatment.  This  assay is FDA approved for nasopharyngeal swab specimens only. Nasal  washings and aspirates are unacceptable for Xpert Xpress Flu testing.   Legionella Pneumophila Serogp 1 Ur Ag     Status: None   Collection Time: 09/18/17  2:06 PM  Result Value Ref Range   L. pneumophila Serogp 1 Ur Ag Negative Negative    Comment: (NOTE) Presumptive negative for L. pneumophila serogroup 1 antigen in urine, suggesting no recent or  current infection. Legionnaires' disease cannot be ruled out since other serogroups and species may also cause disease. Performed At: [facility] [place], [place] [id number] Rush Farmer MD BX:[id number]    Source of Sample URINE, RANDOM     Radiology: Dg Chest 2 View  Result Date: 09/18/2017 CLINICAL DATA:  Shortness of breath. EXAM: CHEST  2 VIEW COMPARISON:  04/20/2017 FINDINGS: Interval removal of left IJ approach central venous catheter. No evidence of pneumothorax. Cardiomediastinal silhouette is normal. Mediastinal contours appear intact. Calcific atherosclerotic disease of the aorta. Mildly increased interstitial markings.  Bibasilar atelectasis. Osseous structures are without acute abnormality. Soft tissues are grossly normal. IMPRESSION: Mildly increased interstitial markings with chronic appearance. Bibasilar atelectasis. Electronically Signed   By: Fidela Salisbury M.D.   On: 09/18/2017 10:50    No results found.  No results found.    Assessment and Plan: Patient Active Problem List   Diagnosis Date Noted  . Smoker   . Acute encephalopathy   . Acute respiratory failure with hypoxemia ([place]) 04/16/2017  . COPD exacerbation ([place])   . Palliative care by specialist   . Goals of care, counseling/discussion   . Respiratory insufficiency 01/14/2014  . Hypoxemia 01/14/2014    1. SOB unclear etiology still. I would be concerned for vascular issues such as PE would get CT of the chest now 2. COPD she has been smoking needs to stop again this was discussed again with her 3. Smoking needs to stop as noted already 4. Anxiety has been stbale  General Counseling: I have discussed the findings of the evaluation and examination with Alicia Rivera.  I have also discussed any further diagnostic evaluation thatmay be needed or ordered today. Alicia Rivera verbalizes understanding of the findings of todays visit. We also reviewed her medications today and discussed drug  interactions and side effects including but not limited excessive drowsiness and altered mental states. We also discussed that  there is always a risk not just to her but also people around her. she has been encouraged to call the office with any questions or concerns that should arise related to todays visit.    Time spent: 26mn  I have personally obtained a history, examined the patient, evaluated laboratory and imaging results, formulated the assessment and plan and placed orders.    SAllyne Gee MD FDigestive Healthcare [facility] MountainsidePulmonary and Critical Care Sleep medicine

## 2017-11-26 ENCOUNTER — Ambulatory Visit: Admission: RE | Admit: 2017-11-26 | Payer: Medicare Other | Source: Ambulatory Visit

## 2017-11-29 ENCOUNTER — Other Ambulatory Visit: Payer: Self-pay | Admitting: Internal Medicine

## 2017-11-30 NOTE — Telephone Encounter (Signed)
Next 3/19

## 2017-12-01 ENCOUNTER — Other Ambulatory Visit: Payer: Self-pay

## 2017-12-01 MED ORDER — IPRATROPIUM-ALBUTEROL 0.5-2.5 (3) MG/3ML IN SOLN: 3.0000 mL | Freq: Four times a day (QID) | RESPIRATORY_TRACT | 5 refills | Status: DC | PRN

## 2017-12-01 MED ORDER — IPRATROPIUM-ALBUTEROL 0.5-2.5 (3) MG/3ML IN SOLN: 3.0000 mL | Freq: Four times a day (QID) | RESPIRATORY_TRACT | 0 refills | Status: DC | PRN

## 2017-12-02 ENCOUNTER — Other Ambulatory Visit: Payer: Self-pay

## 2017-12-02 ENCOUNTER — Ambulatory Visit (INDEPENDENT_AMBULATORY_CARE_PROVIDER_SITE_OTHER): Payer: Medicare Other | Admitting: Internal Medicine

## 2017-12-02 DIAGNOSIS — R0602 Shortness of breath: Secondary | ICD-10-CM | POA: Diagnosis not present

## 2017-12-02 MED ORDER — IPRATROPIUM-ALBUTEROL 0.5-2.5 (3) MG/3ML IN SOLN: 3.0000 mL | Freq: Four times a day (QID) | RESPIRATORY_TRACT | 5 refills | Status: DC | PRN

## 2017-12-08 ENCOUNTER — Telehealth: Payer: Self-pay

## 2017-12-08 MED ORDER — DULOXETINE HCL 20 MG PO CPEP: 40.0000 mg | ORAL_CAPSULE | ORAL | 3 refills | Status: DC

## 2017-12-08 NOTE — Telephone Encounter (Signed)
Error

## 2017-12-09 ENCOUNTER — Other Ambulatory Visit: Payer: Self-pay

## 2017-12-09 MED ORDER — DULOXETINE HCL 20 MG PO CPEP: 40.0000 mg | ORAL_CAPSULE | ORAL | 1 refills | Status: DC

## 2017-12-10 ENCOUNTER — Encounter: Payer: Self-pay | Admitting: Internal Medicine

## 2017-12-10 NOTE — Patient Instructions (Signed)

## 2017-12-10 NOTE — Progress Notes (Signed)
[facility] [address] [place], [id number]  DATE OF SERVICE:  December 02, 2017  Complete Pulmonary Function Testing Interpretation:  FINDINGS:   forced vital capacity is mildly decreased FEV1 is 1.16 L which is 48% of predicted and is severely decreased.  Post bronchodilator there is significant improvement in the FEV1.  FEV1 FVC ratio is moderately decreased.  Total lung capacity by body boxes increased residual volume is increased residual volume total lung capacity ratio is increased FRC is increased.  DLCO is severely reduced  IMPRESSION:   this pulmonary function studies consistent with severe obstructive lung disease.  There is significant improvement post bronchodilator.  DLCO is severely reduced.  Lung volumes are suggestive of hyperinflation  Yevonne PaxSaadat A Khan, MD [facility] Pulmonary Critical Care Medicine Sleep Medicine

## 2017-12-21 ENCOUNTER — Other Ambulatory Visit: Payer: Self-pay

## 2017-12-21 MED ORDER — LOSARTAN POTASSIUM 25 MG PO TABS: 25.0000 mg | ORAL_TABLET | Freq: Two times a day (BID) | ORAL | 1 refills | Status: DC

## 2017-12-24 ENCOUNTER — Ambulatory Visit: Payer: Self-pay | Admitting: Internal Medicine

## 2017-12-28 ENCOUNTER — Ambulatory Visit (INDEPENDENT_AMBULATORY_CARE_PROVIDER_SITE_OTHER): Payer: Medicare Other | Admitting: Internal Medicine

## 2017-12-28 ENCOUNTER — Encounter: Payer: Self-pay | Admitting: Internal Medicine

## 2017-12-28 VITALS — BP 126/80 | HR 82 | Resp 16 | Ht 65.0 in | Wt 173.8 lb

## 2017-12-28 DIAGNOSIS — R0689 Other abnormalities of breathing: Secondary | ICD-10-CM

## 2017-12-28 DIAGNOSIS — F17219 Nicotine dependence, cigarettes, with unspecified nicotine-induced disorders: Secondary | ICD-10-CM | POA: Diagnosis not present

## 2017-12-28 DIAGNOSIS — R0602 Shortness of breath: Secondary | ICD-10-CM

## 2017-12-28 DIAGNOSIS — J449 Chronic obstructive pulmonary disease, unspecified: Secondary | ICD-10-CM | POA: Diagnosis not present

## 2017-12-28 NOTE — Progress Notes (Signed)
[facility] [address] [place], [place] [address]  Pulmonary Sleep Medicine  Office Visit Note  Patient Name: Alicia Rivera DOB: 05/20/1956 MRN [id number]  Date of Service: 12/28/2017  Complaints/HPI:  She states that she is doing much better and has been improved.  She denies having any cough or congestion at this time.  She is not having as much COPD exacerbations as noted.  She had a pulmonary function test which had shown severe COPD.  Unfortunately she is still however smoking periodically and she says that she is down to maybe a half pack a day.  No admissions to the hospital recently  ROS  General: (-) fever, (-) chills, (-) night sweats, (-) weakness Skin: (-) rashes, (-) itching,. Eyes: (-) visual changes, (-) redness, (-) itching. Nose and Sinuses: (-) nasal stuffiness or itchiness, (-) postnasal drip, (-) nosebleeds, (-) sinus trouble. Mouth and Throat: (-) sore throat, (-) hoarseness. Neck: (-) swollen glands, (-) enlarged thyroid, (-) neck pain. Respiratory: - cough, (-) bloody sputum, - shortness of breath, - wheezing. Cardiovascular: - ankle swelling, (-) chest pain. Lymphatic: (-) lymph node enlargement. Neurologic: (-) numbness, (-) tingling. Psychiatric: (-) anxiety, (-) depression   Current Medication: Outpatient Encounter Medications as of 12/28/2017  Medication Sig  . albuterol (PROVENTIL HFA;VENTOLIN HFA) 108 (90 Base) MCG/ACT inhaler Inhale 2 puffs into the lungs every 6 (six) hours as needed for wheezing or shortness of breath.  . ALPRAZolam (XANAX) 0.25 MG tablet TAKE 1/2 TABLET BY MOUTH TWICE DAILY AS NEEDED  . DALIRESP 500 MCG TABS tablet Take 1 tablet by mouth daily.  Marland Kitchen. doxycycline (VIBRAMYCIN) 100 MG capsule TK ONE C PO BID FOR 10 DAYS  . DULoxetine (CYMBALTA) 20 MG capsule Take 2 capsules (40 mg total) by mouth every morning.  . hydrochlorothiazide (HYDRODIURIL) 12.5 MG tablet Take 1 tablet by mouth daily.  Marland Kitchen. ipratropium-albuterol  (DUONEB) 0.5-2.5 (3) MG/3ML SOLN Take 3 mLs by nebulization every 6 (six) hours as needed.  Marland Kitchen. losartan (COZAAR) 25 MG tablet Take 1 tablet (25 mg total) by mouth 2 (two) times daily.  . montelukast (SINGULAIR) 10 MG tablet TAKE 1 TABLET BY MOUTH EVERY DAY  . predniSONE (STERAPRED UNI-PAK 21 TAB) 10 MG (21) TBPK tablet As directed  . SPIRIVA HANDIHALER 18 MCG inhalation capsule Place 1 capsule (18 mcg total) into inhaler and inhale daily.  . SYMBICORT 160-4.5 MCG/ACT inhaler Inhale 1 puff into the lungs 2 (two) times daily.   No facility-administered encounter medications on file as of 12/28/2017.     Surgical History: Past Surgical History:  Procedure Laterality Date  . none      Medical History: Past Medical History:  Diagnosis Date  . COPD (chronic obstructive pulmonary disease) ([facility])   . HTN (hypertension)   . Pneumonia 09/18/2017  . Tobacco abuse     Family History: Family History  Problem Relation Age of Onset  . Emphysema Brother     Social History: Social History   Socioeconomic History  . Marital status: Single    Spouse name: Not on file  . Number of children: Not on file  . Years of education: Not on file  . Highest education level: Not on file  Occupational History  . Not on file  Social Needs  . Financial resource strain: Not on file  . Food insecurity:    Worry: Not on file    Inability: Not on file  . Transportation needs:    Medical: Not on file  Non-medical: Not on file  Tobacco Use  . Smoking status: Current Some Day Smoker  . Smokeless tobacco: Former Neurosurgeon  . Tobacco comment: pt smokes 1 cigarette a day  Substance and Sexual Activity  . Alcohol use: No  . Drug use: No  . Sexual activity: Never  Lifestyle  . Physical activity:    Days per week: Not on file    Minutes per session: Not on file  . Stress: Not on file  Relationships  . Social connections:    Talks on phone: Not on file    Gets together: Not on file    Attends religious  service: Not on file    Active member of club or organization: Not on file    Attends meetings of clubs or organizations: Not on file    Relationship status: Not on file  . Intimate partner violence:    Fear of current or ex partner: Not on file    Emotionally abused: Not on file    Physically abused: Not on file    Forced sexual activity: Not on file  Other Topics Concern  . Not on file  Social History Narrative  . Not on file    Vital Signs: Blood pressure 126/80, pulse 82, resp. rate 16, height 5\' 5"  (1.651 m), weight 173 lb 12.8 oz (78.8 kg), SpO2 96 %.  Examination: General Appearance: The patient is well-developed, well-nourished, and in no distress. Skin: Gross inspection of skin unremarkable. Head: normocephalic, no gross deformities. Eyes: no gross deformities noted. ENT: ears appear grossly normal no exudates. Neck: Supple. No thyromegaly. No LAD. Respiratory: no wheeze noted. Cardiovascular: Normal S1 and S2 without murmur or rub. Extremities: No cyanosis. pulses are equal. Neurologic: Alert and oriented. No involuntary movements.  LABS: No results found for this or any previous visit (from the past 2160 hour(s)).  Radiology: Dg Chest 2 View  Result Date: 09/18/2017 CLINICAL DATA:  Shortness of breath. EXAM: CHEST  2 VIEW COMPARISON:  04/20/2017 FINDINGS: Interval removal of left IJ approach central venous catheter. No evidence of pneumothorax. Cardiomediastinal silhouette is normal. Mediastinal contours appear intact. Calcific atherosclerotic disease of the aorta. Mildly increased interstitial markings.  Bibasilar atelectasis. Osseous structures are without acute abnormality. Soft tissues are grossly normal. IMPRESSION: Mildly increased interstitial markings with chronic appearance. Bibasilar atelectasis. Electronically Signed   By: Ted Mcalpine M.D.   On: 09/18/2017 10:50    No results found.  No results found.    Assessment and Plan: Patient Active  Problem List   Diagnosis Date Noted  . Smoker   . Acute encephalopathy   . Acute respiratory failure with hypoxemia ([facility]) 04/16/2017  . COPD exacerbation ([facility])   . Palliative care by specialist   . Goals of care, counseling/discussion   . Respiratory insufficiency 01/14/2014  . Hypoxemia 01/14/2014    1. COPD severe disease with an FEV1 of 48% predicted.  Discussed with her at 65 the importance of smoking cessation once again 2. SOB secondary to COPD and ongoing tobacco use.  Smoking cessation discussed at length 3. Chronic respiratory failure currently not on oxygen will continue to monitor 4. Nicotine dependence smoking cessation  General Counseling: I have discussed the findings of the evaluation and examination with Joyell.  I have also discussed any further diagnostic evaluation thatmay be needed or ordered today. Eran verbalizes understanding of the findings of todays visit. We also reviewed her medications today and discussed drug interactions and side effects including but not limited excessive  drowsiness and altered mental states. We also discussed that there is always a risk not just to her but also people around her. she has been encouraged to call the office with any questions or concerns that should arise related to todays visit.    Time spent:  I have personally obtained a history, examined the patient, evaluated laboratory and imaging results, formulated the assessment and plan and placed orders.    Yevonne Pax, MD [facility] Pulmonary and Critical Care Sleep medicine

## 2017-12-28 NOTE — Patient Instructions (Signed)

## 2018-01-03 ENCOUNTER — Other Ambulatory Visit: Payer: Self-pay | Admitting: Internal Medicine

## 2018-01-03 DIAGNOSIS — J441 Chronic obstructive pulmonary disease with (acute) exacerbation: Secondary | ICD-10-CM | POA: Diagnosis not present

## 2018-01-03 DIAGNOSIS — R000 Tachycardia, unspecified: Secondary | ICD-10-CM | POA: Diagnosis not present

## 2018-01-03 DIAGNOSIS — Z7951 Long term (current) use of inhaled steroids: Secondary | ICD-10-CM | POA: Diagnosis not present

## 2018-01-03 DIAGNOSIS — F17210 Nicotine dependence, cigarettes, uncomplicated: Secondary | ICD-10-CM | POA: Diagnosis not present

## 2018-01-03 DIAGNOSIS — I10 Essential (primary) hypertension: Secondary | ICD-10-CM | POA: Diagnosis not present

## 2018-01-03 DIAGNOSIS — Z8673 Personal history of transient ischemic attack (TIA), and cerebral infarction without residual deficits: Secondary | ICD-10-CM | POA: Diagnosis not present

## 2018-01-03 DIAGNOSIS — Z882 Allergy status to sulfonamides status: Secondary | ICD-10-CM | POA: Diagnosis not present

## 2018-01-03 DIAGNOSIS — F419 Anxiety disorder, unspecified: Secondary | ICD-10-CM | POA: Insufficient documentation

## 2018-01-03 DIAGNOSIS — R918 Other nonspecific abnormal finding of lung field: Secondary | ICD-10-CM | POA: Diagnosis not present

## 2018-01-03 DIAGNOSIS — Z79899 Other long term (current) drug therapy: Secondary | ICD-10-CM | POA: Diagnosis not present

## 2018-01-03 DIAGNOSIS — R0602 Shortness of breath: Secondary | ICD-10-CM | POA: Diagnosis not present

## 2018-01-03 DIAGNOSIS — F17220 Nicotine dependence, chewing tobacco, uncomplicated: Secondary | ICD-10-CM | POA: Diagnosis present

## 2018-01-05 ENCOUNTER — Emergency Department: Payer: Medicare Other

## 2018-01-05 ENCOUNTER — Inpatient Hospital Stay
Admission: EM | Admit: 2018-01-05 | Discharge: 2018-01-08 | DRG: 189 | Disposition: A | Discharge status: Other Acute Inpatient Hospital | Payer: Medicare Other | Attending: Internal Medicine | Admitting: Internal Medicine

## 2018-01-05 ENCOUNTER — Encounter: Payer: Self-pay | Admitting: Emergency Medicine

## 2018-01-05 ENCOUNTER — Other Ambulatory Visit: Payer: Self-pay

## 2018-01-05 DIAGNOSIS — D72829 Elevated white blood cell count, unspecified: Secondary | ICD-10-CM | POA: Diagnosis not present

## 2018-01-05 DIAGNOSIS — R7989 Other specified abnormal findings of blood chemistry: Secondary | ICD-10-CM

## 2018-01-05 DIAGNOSIS — E871 Hypo-osmolality and hyponatremia: Secondary | ICD-10-CM | POA: Diagnosis not present

## 2018-01-05 DIAGNOSIS — J441 Chronic obstructive pulmonary disease with (acute) exacerbation: Secondary | ICD-10-CM | POA: Diagnosis present

## 2018-01-05 DIAGNOSIS — E869 Volume depletion, unspecified: Secondary | ICD-10-CM | POA: Diagnosis present

## 2018-01-05 DIAGNOSIS — Z882 Allergy status to sulfonamides status: Secondary | ICD-10-CM | POA: Diagnosis not present

## 2018-01-05 DIAGNOSIS — R778 Other specified abnormalities of plasma proteins: Secondary | ICD-10-CM | POA: Diagnosis not present

## 2018-01-05 DIAGNOSIS — I10 Essential (primary) hypertension: Secondary | ICD-10-CM | POA: Diagnosis present

## 2018-01-05 DIAGNOSIS — I248 Other forms of acute ischemic heart disease: Secondary | ICD-10-CM | POA: Diagnosis present

## 2018-01-05 DIAGNOSIS — D649 Anemia, unspecified: Secondary | ICD-10-CM | POA: Diagnosis present

## 2018-01-05 DIAGNOSIS — Z7982 Long term (current) use of aspirin: Secondary | ICD-10-CM

## 2018-01-05 DIAGNOSIS — J449 Chronic obstructive pulmonary disease, unspecified: Secondary | ICD-10-CM | POA: Diagnosis not present

## 2018-01-05 DIAGNOSIS — Z888 Allergy status to other drugs, medicaments and biological substances status: Secondary | ICD-10-CM

## 2018-01-05 DIAGNOSIS — F17200 Nicotine dependence, unspecified, uncomplicated: Secondary | ICD-10-CM | POA: Diagnosis not present

## 2018-01-05 DIAGNOSIS — Z79899 Other long term (current) drug therapy: Secondary | ICD-10-CM

## 2018-01-05 DIAGNOSIS — J9601 Acute respiratory failure with hypoxia: Secondary | ICD-10-CM | POA: Diagnosis not present

## 2018-01-05 DIAGNOSIS — F419 Anxiety disorder, unspecified: Secondary | ICD-10-CM | POA: Diagnosis present

## 2018-01-05 DIAGNOSIS — J9621 Acute and chronic respiratory failure with hypoxia: Secondary | ICD-10-CM | POA: Diagnosis not present

## 2018-01-05 DIAGNOSIS — T380X5A Adverse effect of glucocorticoids and synthetic analogues, initial encounter: Secondary | ICD-10-CM | POA: Diagnosis present

## 2018-01-05 DIAGNOSIS — J9811 Atelectasis: Secondary | ICD-10-CM

## 2018-01-05 DIAGNOSIS — R748 Abnormal levels of other serum enzymes: Secondary | ICD-10-CM | POA: Diagnosis present

## 2018-01-05 DIAGNOSIS — Z7951 Long term (current) use of inhaled steroids: Secondary | ICD-10-CM | POA: Diagnosis not present

## 2018-01-05 DIAGNOSIS — F17210 Nicotine dependence, cigarettes, uncomplicated: Secondary | ICD-10-CM | POA: Diagnosis present

## 2018-01-05 DIAGNOSIS — R0602 Shortness of breath: Secondary | ICD-10-CM | POA: Diagnosis not present

## 2018-01-05 HISTORY — DX: Anxiety disorder, unspecified: F41.9

## 2018-01-05 LAB — BLOOD GAS, VENOUS
ACID-BASE EXCESS: 0.6 mmol/L (ref 0.0–2.0)
BICARBONATE: 26.6 mmol/L (ref 20.0–28.0)
FIO2: 0.32
O2 Saturation: 96 %
PCO2 VEN: 47 mmHg (ref 44.0–60.0)
PH VEN: 7.36 (ref 7.250–7.430)
Patient temperature: 37
pO2, Ven: 85 mmHg — ABNORMAL HIGH (ref 32.0–45.0)

## 2018-01-05 LAB — CBC WITH DIFFERENTIAL/PLATELET
BASOS ABS: 0 10*3/uL (ref 0–0.1)
BASOS PCT: 0 %
EOS ABS: 0.3 10*3/uL (ref 0–0.7)
Eosinophils Relative: 1 %
HCT: 40.3 % (ref 35.0–47.0)
Hemoglobin: 13.4 g/dL (ref 12.0–16.0)
LYMPHS ABS: 4.4 10*3/uL — AB (ref 1.0–3.6)
LYMPHS PCT: 13 %
MCH: 31.1 pg (ref 26.0–34.0)
MCHC: 33.4 g/dL (ref 32.0–36.0)
MCV: 93.2 fL (ref 80.0–100.0)
MONO ABS: 2.3 10*3/uL — AB (ref 0.2–0.9)
Monocytes Relative: 7 %
NEUTROS ABS: 26.5 10*3/uL — AB (ref 1.4–6.5)
Neutrophils Relative %: 79 %
Platelets: 375 10*3/uL (ref 150–440)
RBC: 4.32 MIL/uL (ref 3.80–5.20)
RDW: 14.2 % (ref 11.5–14.5)
WBC: 33.5 10*3/uL — ABNORMAL HIGH (ref 3.6–11.0)

## 2018-01-05 LAB — URINE DRUG SCREEN, QUALITATIVE (ARMC ONLY)
Amphetamines, Ur Screen: NOT DETECTED
BARBITURATES, UR SCREEN: NOT DETECTED
Benzodiazepine, Ur Scrn: NOT DETECTED
CANNABINOID 50 NG, UR ~~LOC~~: NOT DETECTED
COCAINE METABOLITE, UR ~~LOC~~: NOT DETECTED
MDMA (ECSTASY) UR SCREEN: NOT DETECTED
Methadone Scn, Ur: NOT DETECTED
Opiate, Ur Screen: POSITIVE — AB
PHENCYCLIDINE (PCP) UR S: NOT DETECTED
TRICYCLIC, UR SCREEN: NOT DETECTED

## 2018-01-05 LAB — BASIC METABOLIC PANEL
Anion gap: 10 (ref 5–15)
BUN: 15 mg/dL (ref 6–20)
CALCIUM: 9.2 mg/dL (ref 8.9–10.3)
CHLORIDE: 98 mmol/L — AB (ref 101–111)
CO2: 25 mmol/L (ref 22–32)
CREATININE: 0.95 mg/dL (ref 0.44–1.00)
GFR calc non Af Amer: >60 mL/min (ref >60)
Glucose, Bld: 104 mg/dL — ABNORMAL HIGH (ref 65–99)
Potassium: 4.7 mmol/L (ref 3.5–5.1)
SODIUM: 133 mmol/L — AB (ref 135–145)

## 2018-01-05 LAB — TROPONIN I
TROPONIN I: 0.06 ng/mL — AB (ref <0.03)
TROPONIN I: 0.08 ng/mL — AB (ref <0.03)
Troponin I: 0.07 ng/mL (ref <0.03)

## 2018-01-05 LAB — PROCALCITONIN: Procalcitonin: <0.1 ng/mL

## 2018-01-05 LAB — GLUCOSE, CAPILLARY: Glucose-Capillary: 125 mg/dL — ABNORMAL HIGH (ref 65–99)

## 2018-01-05 LAB — BRAIN NATRIURETIC PEPTIDE: B NATRIURETIC PEPTIDE 5: 111 pg/mL — AB (ref 0.0–100.0)

## 2018-01-05 LAB — MRSA PCR SCREENING: MRSA by PCR: NEGATIVE

## 2018-01-05 MED ORDER — LORAZEPAM 2 MG/ML IJ SOLN
1.0000 mg | Freq: Once | INTRAMUSCULAR | Status: AC
  Administered 2018-01-05: 1 mg via INTRAVENOUS

## 2018-01-05 MED ORDER — SENNOSIDES-DOCUSATE SODIUM 8.6-50 MG PO TABS: 1.0000 | ORAL_TABLET | Freq: Every evening | ORAL | Status: DC | PRN

## 2018-01-05 MED ORDER — IPRATROPIUM-ALBUTEROL 0.5-2.5 (3) MG/3ML IN SOLN: 3.0000 mL | Freq: Four times a day (QID) | RESPIRATORY_TRACT | Status: DC

## 2018-01-05 MED ORDER — SODIUM CHLORIDE 0.9% FLUSH: 3.0000 mL | INTRAVENOUS | Status: DC | PRN

## 2018-01-05 MED ORDER — ENOXAPARIN SODIUM 40 MG/0.4ML ~~LOC~~ SOLN
40.0000 mg | Freq: Every day | SUBCUTANEOUS | Status: DC
  Administered 2018-01-05 – 2018-01-07 (×3): 40 mg via SUBCUTANEOUS
  Filled 2018-01-05 (×3): qty 0.4

## 2018-01-05 MED ORDER — LEVOFLOXACIN IN D5W 500 MG/100ML IV SOLN
500.0000 mg | INTRAVENOUS | Status: DC
  Filled 2018-01-05: qty 100

## 2018-01-05 MED ORDER — ACETAMINOPHEN 325 MG PO TABS: 650.0000 mg | ORAL_TABLET | Freq: Four times a day (QID) | ORAL | Status: DC | PRN

## 2018-01-05 MED ORDER — PANTOPRAZOLE SODIUM 40 MG PO TBEC
40.00 mg | DELAYED_RELEASE_TABLET | ORAL | Status: DC
Start: 2018-01-05 — End: 2018-01-05

## 2018-01-05 MED ORDER — GENERIC EXTERNAL MEDICATION
Status: DC
Start: 2018-01-05 — End: 2018-01-05

## 2018-01-05 MED ORDER — DEXMEDETOMIDINE HCL IN NACL 400 MCG/100ML IV SOLN
0.4000 ug/kg/h | INTRAVENOUS | Status: DC
  Administered 2018-01-05: 1 ug/kg/h via INTRAVENOUS
  Administered 2018-01-05: 0.7 ug/kg/h via INTRAVENOUS
  Administered 2018-01-06: 1 ug/kg/h via INTRAVENOUS
  Administered 2018-01-07 (×2): 1.6 ug/kg/h via INTRAVENOUS
  Administered 2018-01-07: 1.4 ug/kg/h via INTRAVENOUS
  Administered 2018-01-07: 1.2 ug/kg/h via INTRAVENOUS
  Administered 2018-01-07: 1.6 ug/kg/h via INTRAVENOUS
  Administered 2018-01-07: 1.2 ug/kg/h via INTRAVENOUS
  Administered 2018-01-07: 1.6 ug/kg/h via INTRAVENOUS
  Administered 2018-01-08: 1.8 ug/kg/h via INTRAVENOUS
  Filled 2018-01-05 (×6): qty 100
  Filled 2018-01-05: qty 200
  Filled 2018-01-05 (×6): qty 100

## 2018-01-05 MED ORDER — IPRATROPIUM BROMIDE 0.02 % IN SOLN
500.00 | RESPIRATORY_TRACT | Status: DC
Start: 2018-01-04 — End: 2018-01-05

## 2018-01-05 MED ORDER — ONDANSETRON HCL 4 MG PO TABS: 4.0000 mg | ORAL_TABLET | Freq: Four times a day (QID) | ORAL | Status: DC | PRN

## 2018-01-05 MED ORDER — MONTELUKAST SODIUM 10 MG PO TABS
10.0000 mg | ORAL_TABLET | Freq: Every day | ORAL | Status: DC
  Administered 2018-01-06 – 2018-01-07 (×2): 10 mg via ORAL
  Filled 2018-01-05 (×2): qty 1

## 2018-01-05 MED ORDER — IPRATROPIUM-ALBUTEROL 0.5-2.5 (3) MG/3ML IN SOLN
3.0000 mL | Freq: Four times a day (QID) | RESPIRATORY_TRACT | Status: DC
  Administered 2018-01-05 – 2018-01-07 (×8): 3 mL via RESPIRATORY_TRACT
  Filled 2018-01-05 (×8): qty 3

## 2018-01-05 MED ORDER — METHYLPREDNISOLONE SODIUM SUCC 125 MG IJ SOLR
60.0000 mg | Freq: Four times a day (QID) | INTRAMUSCULAR | Status: DC
  Administered 2018-01-05 – 2018-01-06 (×5): 60 mg via INTRAVENOUS
  Filled 2018-01-05 (×5): qty 2

## 2018-01-05 MED ORDER — LEVOFLOXACIN 750 MG PO TABS
750.00 mg | ORAL_TABLET | ORAL | Status: DC
Start: 2018-01-05 — End: 2018-01-05

## 2018-01-05 MED ORDER — LEVOFLOXACIN IN D5W 750 MG/150ML IV SOLN
750.0000 mg | Freq: Once | INTRAVENOUS | Status: AC
  Administered 2018-01-05: 750 mg via INTRAVENOUS
  Filled 2018-01-05: qty 150

## 2018-01-05 MED ORDER — ACETAMINOPHEN 650 MG RE SUPP: 650.0000 mg | Freq: Four times a day (QID) | RECTAL | Status: DC | PRN

## 2018-01-05 MED ORDER — ROFLUMILAST 500 MCG PO TABS
500.0000 ug | ORAL_TABLET | Freq: Every day | ORAL | Status: DC
  Administered 2018-01-06 – 2018-01-07 (×2): 500 ug via ORAL
  Filled 2018-01-05 (×4): qty 1

## 2018-01-05 MED ORDER — SODIUM CHLORIDE 0.9 % IV SOLN: 250.0000 mL | INTRAVENOUS | Status: DC | PRN

## 2018-01-05 MED ORDER — ALPRAZOLAM 0.5 MG PO TABS
0.2500 mg | ORAL_TABLET | Freq: Three times a day (TID) | ORAL | Status: DC | PRN
  Administered 2018-01-05 – 2018-01-07 (×4): 0.25 mg via ORAL
  Filled 2018-01-05 (×4): qty 1

## 2018-01-05 MED ORDER — ACETAMINOPHEN 500 MG PO TABS
1000.00 mg | ORAL_TABLET | ORAL | Status: DC
Start: ? — End: 2018-01-05

## 2018-01-05 MED ORDER — NICOTINE 21 MG/24HR TD PT24: 21.0000 mg | MEDICATED_PATCH | Freq: Every day | TRANSDERMAL | Status: DC

## 2018-01-05 MED ORDER — LACTATED RINGERS IV SOLN
75.00 | INTRAVENOUS | Status: DC
Start: ? — End: 2018-01-05

## 2018-01-05 MED ORDER — IOHEXOL 350 MG/ML SOLN
75.0000 mL | Freq: Once | INTRAVENOUS | Status: AC | PRN
  Administered 2018-01-05: 75 mL via INTRAVENOUS

## 2018-01-05 MED ORDER — LOSARTAN POTASSIUM 50 MG PO TABS
25.0000 mg | ORAL_TABLET | Freq: Two times a day (BID) | ORAL | Status: DC
  Administered 2018-01-05 – 2018-01-07 (×4): 25 mg via ORAL
  Filled 2018-01-05 (×4): qty 1

## 2018-01-05 MED ORDER — ASPIRIN EC 81 MG PO TBEC
81.0000 mg | DELAYED_RELEASE_TABLET | Freq: Every day | ORAL | Status: DC
  Administered 2018-01-05 – 2018-01-07 (×3): 81 mg via ORAL
  Filled 2018-01-05 (×3): qty 1

## 2018-01-05 MED ORDER — MONTELUKAST SODIUM 10 MG PO TABS
10.00 mg | ORAL_TABLET | ORAL | Status: DC
Start: 2018-01-04 — End: 2018-01-05

## 2018-01-05 MED ORDER — LORATADINE 10 MG PO TABS
10.00 mg | ORAL_TABLET | ORAL | Status: DC
Start: 2018-01-05 — End: 2018-01-05

## 2018-01-05 MED ORDER — ONDANSETRON HCL 4 MG/2ML IJ SOLN: 4.0000 mg | Freq: Four times a day (QID) | INTRAMUSCULAR | Status: DC | PRN

## 2018-01-05 MED ORDER — METHYLPREDNISOLONE SODIUM SUCC 125 MG IJ SOLR
60.00 mg | INTRAMUSCULAR | Status: DC
Start: 2018-01-04 — End: 2018-01-05

## 2018-01-05 MED ORDER — ONDANSETRON 4 MG PO TBDP
4.00 mg | ORAL_TABLET | ORAL | Status: DC
Start: ? — End: 2018-01-05

## 2018-01-05 MED ORDER — IPRATROPIUM-ALBUTEROL 0.5-2.5 (3) MG/3ML IN SOLN
10.0000 mL | Freq: Four times a day (QID) | RESPIRATORY_TRACT | Status: DC
  Administered 2018-01-05: 10 mL via RESPIRATORY_TRACT

## 2018-01-05 MED ORDER — LORATADINE 10 MG PO TABS
10.0000 mg | ORAL_TABLET | Freq: Every day | ORAL | Status: DC
  Administered 2018-01-06 – 2018-01-07 (×2): 10 mg via ORAL
  Filled 2018-01-05 (×2): qty 1

## 2018-01-05 MED ORDER — HYDROCHLOROTHIAZIDE 25 MG PO TABS
25.0000 mg | ORAL_TABLET | Freq: Every day | ORAL | Status: DC
  Administered 2018-01-05 – 2018-01-07 (×3): 25 mg via ORAL
  Filled 2018-01-05 (×3): qty 1

## 2018-01-05 MED ORDER — SODIUM CHLORIDE 0.9% FLUSH
3.0000 mL | Freq: Two times a day (BID) | INTRAVENOUS | Status: DC
Start: 2018-01-05 — End: 2018-01-08
  Administered 2018-01-05 – 2018-01-07 (×6): 3 mL via INTRAVENOUS

## 2018-01-05 MED ORDER — DULOXETINE HCL 20 MG PO CPEP
40.0000 mg | ORAL_CAPSULE | ORAL | Status: DC
  Administered 2018-01-06 – 2018-01-07 (×2): 40 mg via ORAL
  Filled 2018-01-05 (×3): qty 2

## 2018-01-05 MED ORDER — ENOXAPARIN SODIUM 40 MG/0.4ML ~~LOC~~ SOLN
40.00 mg | SUBCUTANEOUS | Status: DC
Start: 2018-01-04 — End: 2018-01-05

## 2018-01-05 MED ORDER — ALBUTEROL SULFATE (2.5 MG/3ML) 0.083% IN NEBU
2.50 mg | INHALATION_SOLUTION | RESPIRATORY_TRACT | Status: DC
Start: ? — End: 2018-01-05

## 2018-01-05 MED ORDER — SODIUM CHLORIDE 0.9 % IV SOLN
250.0000 mL | INTRAVENOUS | Status: AC | PRN
  Administered 2018-01-05: 250 mL via INTRAVENOUS

## 2018-01-05 MED ORDER — LORAZEPAM 0.5 MG PO TABS
0.50 mg | ORAL_TABLET | ORAL | Status: DC
Start: ? — End: 2018-01-05

## 2018-01-05 MED ORDER — ALBUTEROL SULFATE (2.5 MG/3ML) 0.083% IN NEBU
2.50 mg | INHALATION_SOLUTION | RESPIRATORY_TRACT | Status: DC
Start: 2018-01-04 — End: 2018-01-05

## 2018-01-05 MED ORDER — IPRATROPIUM-ALBUTEROL 0.5-2.5 (3) MG/3ML IN SOLN
RESPIRATORY_TRACT | Status: AC
  Administered 2018-01-05: 10 mL via RESPIRATORY_TRACT
  Filled 2018-01-05: qty 9

## 2018-01-05 NOTE — ED Triage Notes (Signed)
PT arrived from with complaints of shortness of breath.  Pt's initial room air saturation was 82% and pt was placed on cpap. Upon arrival to ED pt's oxygen saturation 98%. Pt was given 1.5" of NTG, 1 spray of NTG, 125mg  of solumedrol, and 1 duo neb in route.

## 2018-01-05 NOTE — ED Provider Notes (Signed)
[facility] Emergency Department Provider Note       Time seen: ----------------------------------------- 8:47 AM on 01/05/2018 -----------------------------------------   I have reviewed the triage vital signs and the nursing notes.  HISTORY   Chief Complaint Shortness of Breath    HPI Alicia Rivera is a 62 y.o. female with a history of COPD, hypertension, pneumonia and tobacco abuse who presents to the ED for shortness of breath.  Patient was reportedly just discharged from the hospital yesterday at [facility].  Room air saturations were 82% and she was placed on CPAP.  On arrival her oxygen saturation was 98%.  She was given some nitroglycerin and his bradycardia of same.  She was also given Solu-Medrol and 1 DuoNeb in route.  She presents somewhat improved but still in respiratory distress.  Patient states she became acutely worse regarding her shortness of breath this morning.  Past Medical History:  Diagnosis Date  . COPD (chronic obstructive pulmonary disease) (HCC)   . HTN (hypertension)   . Pneumonia 09/18/2017  . Tobacco abuse     Patient Active Problem List   Diagnosis Date Noted  . Smoker   . Acute encephalopathy   . Acute respiratory failure with hypoxemia (HCC) 04/16/2017  . COPD exacerbation (HCC)   . Palliative care by specialist   . Goals of care, counseling/discussion   . Respiratory insufficiency 01/14/2014  . Hypoxemia 01/14/2014    Past Surgical History:  Procedure Laterality Date  . none      Allergies Other; Trelegy ellipta [fluticasone-umeclidin-vilant]; Sulfa antibiotics; and Sulfasalazine  Social History Social History   Tobacco Use  . Smoking status: Current Some Day Smoker  . Smokeless tobacco: Former Neurosurgeon  . Tobacco comment: pt smokes 1 cigarette a day  Substance Use Topics  . Alcohol use: No  . Drug use: No   Review of Systems Constitutional: Negative for fever. Cardiovascular: Negative for chest  pain. Respiratory: Positive for shortness of breath and wheezing Gastrointestinal: Negative for abdominal pain, vomiting and diarrhea. Musculoskeletal: Negative for back pain. Skin: Negative for rash. Neurological: Negative for headaches, positive for weakness  All systems negative/normal/unremarkable except as stated in the HPI  ____________________________________________   PHYSICAL EXAM:  VITAL SIGNS: ED Triage Vitals  Enc Vitals Group     BP      Pulse      Resp      Temp      Temp src      SpO2      Weight      Height      Head Circumference      Peak Flow      Pain Score      Pain Loc      Pain Edu?      Excl. in GC?    Constitutional: Alert and oriented.  Mild to moderate distress Eyes: Conjunctivae are normal. Normal extraocular movements. ENT   Head: Normocephalic and atraumatic.   Nose: No congestion/rhinnorhea.   Mouth/Throat: Mucous membranes are moist.   Neck: No stridor. Cardiovascular: Normal rate, regular rhythm. No murmurs, rubs, or gallops. Respiratory: Tachypnea with wheezing bilaterally Gastrointestinal: Soft and nontender. Normal bowel sounds Musculoskeletal: Nontender with normal range of motion in extremities. No lower extremity tenderness nor edema. Neurologic:  Normal speech and language. No gross focal neurologic deficits are appreciated.  Skin:  Skin is warm, dry and intact. No rash noted. Psychiatric: Mood and affect are normal. Speech and behavior are normal.  ____________________________________________  EKG:  Interpreted by me.  Sinus tachycardia with a rate of 126 bpm, normal PR interval, possible septal infarct age-indeterminate, normal axis  ____________________________________________  ED COURSE:  As part of my medical decision making, I reviewed the following data within the electronic MEDICAL RECORD NUMBER History obtained from family if available, nursing notes, old chart and ekg, as well as notes from prior ED  visits. Patient presented for likely COPD exacerbation with dyspnea, we will assess with labs and imaging as indicated at this time.   Procedures ____________________________________________   LABS (pertinent positives/negatives)  Labs Reviewed  CBC WITH DIFFERENTIAL/PLATELET - Abnormal; Notable for the following components:      Result Value   WBC 33.5 (*)    Neutro Abs 26.5 (*)    Lymphs Abs 4.4 (*)    Monocytes Absolute 2.3 (*)    All other components within normal limits  BASIC METABOLIC PANEL - Abnormal; Notable for the following components:   Sodium 133 (*)    Chloride 98 (*)    Glucose, Bld 104 (*)    All other components within normal limits  BRAIN NATRIURETIC PEPTIDE - Abnormal; Notable for the following components:   B Natriuretic Peptide 111.0 (*)    All other components within normal limits  TROPONIN I - Abnormal; Notable for the following components:   Troponin I 0.06 (*)    All other components within normal limits  BLOOD GAS, VENOUS - Abnormal; Notable for the following components:   pO2, Ven 85.0 (*)    All other components within normal limits  URINE DRUG SCREEN, QUALITATIVE ([facility] ONLY)   CRITICAL CARE Performed by: Ulice DashJohnathan E Journey [place]   Total critical care time: 30 minutes  Critical care time was exclusive of separately billable procedures and treating other patients.  Critical care was necessary to treat or prevent imminent or life-threatening deterioration.  Critical care was time spent personally by me on the following activities: development of treatment plan with patient and/or surrogate as well as nursing, discussions with consultants, evaluation of patient's response to treatment, examination of patient, obtaining history from patient or surrogate, ordering and performing treatments and interventions, ordering and review of laboratory studies, ordering and review of radiographic studies, pulse oximetry and re-evaluation of patient's  condition. RADIOLOGY Images were viewed by me  Chest x-ray IMPRESSION: Hyperinflation consistent with COPD. Mild chronic fibrotic changes in the lower lobes. No alveolar pneumonia nor CHF.  Thoracic aortic atherosclerosis. CTA chest  IMPRESSION: 1.  Negative for acute pulmonary embolus. 2.  Emphysema (ICD10-J43.9).  No acute pulmonary abnormality. 3. Calcified coronary artery and aortic Atherosclerosis (ICD10-I70.0). No cardiomegaly. 4. Stable chronic thoracic compression fractures. ____________________________________________  DIFFERENTIAL DIAGNOSIS   COPD, pneumonia, anxiety, pneumothorax  FINAL ASSESSMENT AND PLAN  COPD exacerbation, elevated troponin   Plan: The patient had presented for COPD exacerbation and was placed on BiPAP immediately. Patient's labs did reveal a mildly elevated troponin of 0.06 which is new for her. Patient's imaging not reveal any acute process.  We will try to wean her off of BiPAP, she received steroids and breathing treatments here.  I will discuss with the hospitalist for admission.   Ulice DashJohnathan E Cynde Menard, MD   Note: This note was generated in part or whole with voice recognition software. Voice recognition is usually quite accurate but there are transcription errors that can and very often do occur. I apologize for any typographical errors that were not detected and corrected.     Emily FilbertWilliams, Zoltan Genest E, MD 01/05/18 1056

## 2018-01-05 NOTE — H&P (Addendum)
[place]  [facility] [place] [facility]   PATIENT NAME: Alicia Brownseggy Clavel    MR#:  [id number]  DATE OF BIRTH:  10/02/1956  DATE OF ADMISSION:  01/05/2018  PRIMARY CARE PHYSICIAN: Carlean JewsBoscia, Heather E, NP   REQUESTING/REFERRING PHYSICIAN:   CHIEF COMPLAINT:   Chief Complaint  Patient presents with  . Shortness of Breath    HISTORY OF PRESENT ILLNESS: Alicia Rivera  is a 62 y.o. female with a known history of emphysema, tobacco abuse, hypertension presented to the emergency room with increased shortness of breath and wheezing since 1 day.  [facility]ation was discharged yesterday.  Patient continued oral steroids and oral Levaquin antibiotic but continued to be short of breath and had increased wheezing.  In the emergency room she was put on BiPAP and stabilized for respiratory distress.  She has cough productive of phlegm.  She was given nebulization treatments aggressively.  No complaints of any chest pain.  No history of any headache, dizziness and blurry vision.  Hospitalist service was consulted for further care.  Patient is an active smoker.  She was worked up with CT angiogram of the chest which showed no pulmonary embolism.  Old compression fractures noted.  PAST MEDICAL HISTORY:   Past Medical History:  Diagnosis Date  . COPD (chronic obstructive pulmonary disease) (HCC)   . HTN (hypertension)   . Pneumonia 09/18/2017  . Tobacco abuse     PAST SURGICAL HISTORY:  Past Surgical History:  Procedure Laterality Date  . none      SOCIAL HISTORY:  Social History   Tobacco Use  . Smoking status: Current Some Day Smoker  . Smokeless tobacco: Former NeurosurgeonUser  . Tobacco comment: pt smokes 1 cigarette a day  Substance Use Topics  . Alcohol use: No    FAMILY HISTORY:  Family History  Problem Relation Age of Onset  . Emphysema Brother   Mother deceased : lung disease Father deceased : Heart disease  DRUG ALLERGIES:  Allergies   Allergen Reactions  . Other Hives    trilogy  . Trelegy Ellipta [Fluticasone-Umeclidin-Vilant] Anaphylaxis  . Sulfa Antibiotics   . Sulfasalazine Hives    REVIEW OF SYSTEMS:   CONSTITUTIONAL: No fever,has fatigue and weakness.  EYES: No blurred or double vision.  EARS, NOSE, AND THROAT: No tinnitus or ear pain.  RESPIRATORY: Has cough, shortness of breath, wheezing  no hemoptysis.  CARDIOVASCULAR: No chest pain, orthopnea, edema.  GASTROINTESTINAL: No nausea, vomiting, diarrhea or abdominal pain.  GENITOURINARY: No dysuria, hematuria.  ENDOCRINE: No polyuria, nocturia,  HEMATOLOGY: No anemia, easy bruising or bleeding SKIN: No rash or lesion. MUSCULOSKELETAL: No joint pain or arthritis.   NEUROLOGIC: No tingling, numbness, weakness.  PSYCHIATRY: No anxiety or depression.   MEDICATIONS AT HOME:  Prior to Admission medications   Medication Sig Start Date End Date Taking? Authorizing Provider  albuterol (PROVENTIL HFA;VENTOLIN HFA) 108 (90 Base) MCG/ACT inhaler Inhale 2 puffs into the lungs every 6 (six) hours as needed for wheezing or shortness of breath. 09/18/17  Yes Veronese, [place], MD  ALPRAZolam Prudy Feeler(XANAX) 0.25 MG tablet TAKE 1/2 TABLET BY MOUTH TWICE DAILY AS NEEDED 12/03/17  Yes Lyndon CodeKhan, Fozia M, MD  aspirin EC 81 MG tablet Take 81 mg by mouth daily.   Yes [provider]  cetirizine (ZYRTEC) 10 MG tablet Take 10 mg by mouth daily. 01/04/18  Yes [provider]  DALIRESP 500 MCG TABS tablet Take 1 tablet by mouth  daily. 04/02/17  Yes [provider]  DULoxetine (CYMBALTA) 20 MG capsule Take 2 capsules (40 mg total) by mouth every morning. 12/09/17  Yes Boscia, Heather E, NP  hydrochlorothiazide (HYDRODIURIL) 25 MG tablet Take 25 mg by mouth daily.  02/03/17  Yes [provider]  ipratropium-albuterol (DUONEB) 0.5-2.5 (3) MG/3ML SOLN Take 3 mLs by nebulization every 6 (six) hours as needed. 12/02/17  Yes Yevonne Pax, MD  levofloxacin (LEVAQUIN) 750  MG tablet Take 750 mg by mouth daily. 01/04/18 01/08/18 Yes [provider]  losartan (COZAAR) 25 MG tablet Take 1 tablet (25 mg total) by mouth 2 (two) times daily. Patient taking differently: Take 25 mg by mouth daily.  12/21/17  Yes Boscia, Heather E, NP  montelukast (SINGULAIR) 10 MG tablet TAKE 1 TABLET BY MOUTH EVERY DAY 01/04/18  Yes Boscia, Heather E, NP  predniSONE (STERAPRED UNI-PAK 21 TAB) 10 MG (21) TBPK tablet As directed 11/23/17  Yes Yevonne Pax, MD  SPIRIVA HANDIHALER 18 MCG inhalation capsule Place 1 capsule (18 mcg total) into inhaler and inhale daily. 04/23/17  Yes Houston Siren, MD  SYMBICORT 160-4.5 MCG/ACT inhaler Inhale 1 puff into the lungs 2 (two) times daily. 04/23/17  Yes Sainani, Rolly Pancake, MD      PHYSICAL EXAMINATION:   VITAL SIGNS: Blood pressure 130/89, pulse (!) 107, temperature 98 F (36.7 C), temperature source Axillary, resp. rate (!) 33, height 5\' 5"  (1.651 m), weight 78.5 kg (173 lb), SpO2 96 %.  BiPAP IPAP 10 EPAP 5 Rate 8 FiO2 32%  GENERAL:  62 y.o.-year-old patient lying in the bed with mild to moderate respiratory distress EYES: Pupils equal, round, reactive to light and accommodation. No scleral icterus. Extraocular muscles intact.  HEENT: Head atraumatic, normocephalic. Oropharynx and nasopharynx clear.  NECK:  Supple, no jugular venous distention. No thyroid enlargement, no tenderness.  LUNGS: Decreased breath sounds bilaterally, bilateral wheezing noted.  Scattered Rales in both lungs CARDIOVASCULAR: S1, S2 tachycardia noted. No murmurs, rubs, or gallops.  ABDOMEN: Soft, nontender, nondistended. Bowel sounds present. No organomegaly or mass.  EXTREMITIES: No pedal edema, cyanosis, or clubbing.  NEUROLOGIC: Cranial nerves II through XII are intact. Muscle strength 5/5 in all extremities. Sensation intact. Gait not checked.  PSYCHIATRIC: The patient is alert and oriented x 3.  SKIN: No obvious rash, lesion, or ulcer.   LABORATORY PANEL:    CBC Recent Labs  Lab 01/05/18 0847  WBC 33.5*  HGB 13.4  HCT 40.3  PLT 375  MCV 93.2  MCH 31.1  MCHC 33.4  RDW 14.2  LYMPHSABS 4.4*  MONOABS 2.3*  EOSABS 0.3  BASOSABS 0.0   ------------------------------------------------------------------------------------------------------------------  Chemistries  Recent Labs  Lab 01/05/18 0847  NA 133*  K 4.7  CL 98*  CO2 25  GLUCOSE 104*  BUN 15  CREATININE 0.95  CALCIUM 9.2   ------------------------------------------------------------------------------------------------------------------ estimated creatinine clearance is 62 mL/min (by C-G formula based on SCr of 0.95 mg/dL). ------------------------------------------------------------------------------------------------------------------ No results for input(s): TSH, T4TOTAL, T3FREE, THYROIDAB in the last 72 hours.  Invalid input(s): FREET3   Coagulation profile No results for input(s): INR, PROTIME in the last 168 hours. ------------------------------------------------------------------------------------------------------------------- No results for input(s): DDIMER in the last 72 hours. -------------------------------------------------------------------------------------------------------------------  Cardiac Enzymes Recent Labs  Lab 01/05/18 0847  TROPONINI 0.06*   ------------------------------------------------------------------------------------------------------------------ Invalid input(s): POCBNP  ---------------------------------------------------------------------------------------------------------------  Urinalysis No results found for: COLORURINE, APPEARANCEUR, LABSPEC, PHURINE, GLUCOSEU, HGBUR, BILIRUBINUR, KETONESUR, PROTEINUR, UROBILINOGEN, NITRITE, LEUKOCYTESUR   RADIOLOGY: Ct Angio Chest Pe W And/or Wo  Contrast  Result Date: 01/05/2018 CLINICAL DATA:  62 year old female with shortness of breath, hypoxia. Leukocytosis and elevated troponin.  EXAM: CT ANGIOGRAPHY CHEST WITH CONTRAST TECHNIQUE: Multidetector CT imaging of the chest was performed using the standard protocol during bolus administration of intravenous contrast. Multiplanar CT image reconstructions and MIPs were obtained to evaluate the vascular anatomy. CONTRAST:  75mL OMNIPAQUE IOHEXOL 350 MG/ML SOLN COMPARISON:  Chest CTA 04/12/2017. FINDINGS: Cardiovascular: Good contrast bolus timing in the pulmonary arterial tree. Mild lower lobe respiratory motion. No focal filling defect identified in the pulmonary arteries to suggest acute pulmonary embolism. Calcified aortic atherosclerosis. Calcified coronary artery atherosclerosis. No cardiomegaly or pericardial effusion. Mediastinum/Nodes: Negative.  No lymphadenopathy. Lungs/Pleura: Large lung volumes with chronic emphysema. Trace retained secretions or cine key I in the trachea and mainstem bronchi. Otherwise the major airways are patent. There is mild generalized peribronchial thickening. Larger lung volumes compared to 2018. No pleural effusion. No acute or confluent pulmonary opacity. Upper Abdomen: Calcified aortic atherosclerosis. Negative visible upper abdominal viscera. Musculoskeletal: Osteopenia. Chronic compression fractures of the T3, T7, T8, and T9 vertebrae. Mild anterolisthesis at C7-T1 with facet degeneration. Small C7 cervical ribs. No acute osseous abnormality identified. Review of the MIP images confirms the above findings. IMPRESSION: 1.  Negative for acute pulmonary embolus. 2.  Emphysema (ICD10-J43.9).  No acute pulmonary abnormality. 3. Calcified coronary artery and aortic Atherosclerosis (ICD10-I70.0). No cardiomegaly. 4. Stable chronic thoracic compression fractures. Electronically Signed   By: Odessa Fleming M.D.   On: 01/05/2018 10:36   Dg Chest Port 1 View  Result Date: 01/05/2018 CLINICAL DATA:  Shortness of breath, hypoxic on room air. History of COPD. Current smoker. EXAM: PORTABLE CHEST 1 VIEW COMPARISON:  Chest x-ray  of September 18, 2017 FINDINGS: The lungs are hyperinflated. The lung markings are coarse in the infrahilar regions bilaterally but this is not new. There is no alveolar infiltrate or pleural effusion. The heart and pulmonary vascularity are normal. There is calcification in the wall of the aortic arch. The bony thorax is unremarkable. IMPRESSION: Hyperinflation consistent with COPD. Mild chronic fibrotic changes in the lower lobes. No alveolar pneumonia nor CHF. Thoracic aortic atherosclerosis. Electronically Signed   By: David  [place] M.D.   On: 01/05/2018 09:05    EKG: Orders placed or performed during the hospital encounter of 01/05/18  . ED EKG  . ED EKG  . EKG 12-Lead  . EKG 12-Lead  . EKG 12-Lead  . EKG 12-Lead    IMPRESSION AND PLAN:  62 year old female patient with history of hypertension, emphysema, tobacco abuse presented to the emergency room with shortness of breath, wheezing and respiratory distress.  1.  Acute hypoxic respiratory failure Continue BiPAP for respiratory distress Intensivist consultation  2.  Acute COPD exacerbation IV Solu-Medrol 60 mg every 6 hourly Nebulization treatment aggressively BiPAP for respiratory distress Intensivist consultation IV Levaquin antibiotic  3.  Hyponatremia Monitor electrolytes  4.  Leukocytosis secondary to steroids  5.  Tobacco abuse Tobacco cessation counseled to the patient for 6 minutes Nicotine patch offered Harmful effects of smoking on respiratory system cardiovascular system and nervous system explained.  6.  Anxiety disorder Continue Xanax as needed for anxiety  7.  Elevated troponin Possibly from demand ischemia We will cycle troponin  All the records are reviewed and case discussed with ED provider. Management plans discussed with the patient, family and they are in agreement.  CODE STATUS: Full code Code Status History    Date Active Date Inactive Code Status  Order ID Comments User Context   04/16/2017  0830 04/23/2017 1738 Full Code [id number]  Arnaldo Natal, MD Inpatient       TOTAL CRITICAL CARE TIME TAKING CARE OF THIS PATIENT: 60 minutes.    Ihor Austin M.D on 01/05/2018 at 11:31 AM  Between 7am to 6pm - Pager - [phone] Ascom [phone]  After 6pm go to [email or web address] - password [email or web address] [facility]  [place] [facility]  Office  [phone]  CC: Primary care physician; Carlean Jews, NP

## 2018-01-05 NOTE — Progress Notes (Signed)
Pharmacy Antibiotic Note  Alicia Rivera is a 62 y.o. female admitted on 01/05/2018 with COPD flare. Pharmacy has been consulted for levofloxacin dosing.  Plan: Levofloxacin 500mg  IV daily   Height: 5\' 5"  (165.1 cm) Weight: 173 lb (78.5 kg) IBW/kg (Calculated) : 57  Temp (24hrs), Avg:98 F (36.7 C), Min:98 F (36.7 C), Max:98 F (36.7 C)  Recent Labs  Lab 01/05/18 0847  WBC 33.5*  CREATININE 0.95    Estimated Creatinine Clearance: 62 mL/min (by C-G formula based on SCr of 0.95 mg/dL).    Allergies  Allergen Reactions  . Other Hives    trilogy  . Trelegy Ellipta [Fluticasone-Umeclidin-Vilant] Anaphylaxis  . Sulfa Antibiotics   . Sulfasalazine Hives    Antimicrobials this admission: 4/2 Levofloxacin >>   Dose adjustments this admission:   Microbiology results:   Thank you for allowing pharmacy to be a part of this patient's care.  Cleopatra CedarStephanie Hayat [place], PharmD Pharmacy Resident  01/05/2018 11:21 AM

## 2018-01-05 NOTE — Progress Notes (Signed)
Pt was transported from the ED to CCU while on the bipap. 

## 2018-01-05 NOTE — Consult Note (Signed)
Name: Alicia Rivera MRN: [id number] DOB: 1956-07-17     CONSULTATION DATE: 01/05/2018  62 years old lady with history of emphysema, tobacco abuse, anxiety, and hypertension. Patient is admitted to the hospitalist service with acute respiratory failure with COPD exacerbation and has been started on bronchodilators, steroids and empiric Levaquin. Patient arrived to ICU on the BiPAP no distress and no chest pain. All history was obtained from admitting physician, the patient and EMR  PAST MEDICAL HISTORY :   has a past medical history of COPD (chronic obstructive pulmonary disease) (HCC), HTN (hypertension), Pneumonia (09/18/2017), and Tobacco abuse.  has a past surgical history that includes none. Prior to Admission medications   Medication Sig Start Date End Date Taking? Authorizing Provider  albuterol (PROVENTIL HFA;VENTOLIN HFA) 108 (90 Base) MCG/ACT inhaler Inhale 2 puffs into the lungs every 6 (six) hours as needed for wheezing or shortness of breath. 09/18/17  Yes Veronese, [place], MD  ALPRAZolam Prudy Feeler) 0.25 MG tablet TAKE 1/2 TABLET BY MOUTH TWICE DAILY AS NEEDED 12/03/17  Yes Lyndon Code, MD  aspirin EC 81 MG tablet Take 81 mg by mouth daily.   Yes [provider]  cetirizine (ZYRTEC) 10 MG tablet Take 10 mg by mouth daily. 01/04/18  Yes [provider]  DALIRESP 500 MCG TABS tablet Take 1 tablet by mouth daily. 04/02/17  Yes [provider]  DULoxetine (CYMBALTA) 20 MG capsule Take 2 capsules (40 mg total) by mouth every morning. 12/09/17  Yes Boscia, Heather E, NP  hydrochlorothiazide (HYDRODIURIL) 25 MG tablet Take 25 mg by mouth daily.  02/03/17  Yes [provider]  ipratropium-albuterol (DUONEB) 0.5-2.5 (3) MG/3ML SOLN Take 3 mLs by nebulization every 6 (six) hours as needed. 12/02/17  Yes Yevonne Pax, MD  levofloxacin (LEVAQUIN) 750 MG tablet Take 750 mg by mouth daily. 01/04/18 01/08/18 Yes [provider]  losartan (COZAAR) 25 MG tablet  Take 1 tablet (25 mg total) by mouth 2 (two) times daily. Patient taking differently: Take 25 mg by mouth daily.  12/21/17  Yes Boscia, Heather E, NP  montelukast (SINGULAIR) 10 MG tablet TAKE 1 TABLET BY MOUTH EVERY DAY 01/04/18  Yes Boscia, Heather E, NP  predniSONE (STERAPRED UNI-PAK 21 TAB) 10 MG (21) TBPK tablet As directed 11/23/17  Yes Yevonne Pax, MD  SPIRIVA HANDIHALER 18 MCG inhalation capsule Place 1 capsule (18 mcg total) into inhaler and inhale daily. 04/23/17  Yes Houston Siren, MD  SYMBICORT 160-4.5 MCG/ACT inhaler Inhale 1 puff into the lungs 2 (two) times daily. 04/23/17  Yes Houston Siren, MD   Allergies  Allergen Reactions  . Other Hives    trilogy  . Trelegy Ellipta [Fluticasone-Umeclidin-Vilant] Anaphylaxis  . Sulfa Antibiotics   . Sulfasalazine Hives    FAMILY HISTORY:  family history includes Emphysema in her brother; Heart disease in her father; Lung disease in her mother. SOCIAL HISTORY:  reports that she has been smoking.  She has quit using smokeless tobacco. She reports that she does not drink alcohol or use drugs.  REVIEW OF SYSTEMS:   Unable to obtain due to critical illness   VITAL SIGNS: Temp:  [97.6 F (36.4 C)-98 F (36.7 C)] 97.6 F (36.4 C) (04/02 1200) Pulse Rate:  [97-121] 97 (04/02 1400) Resp:  [20-33] 20 (04/02 1200) BP: (111-160)/(73-102) 111/74 (04/02 1400) SpO2:  [96 %-99 %] 98 % (04/02 1400) FiO2 (%):  [4 %] 4 % (04/02 1055) Weight:  [78.5 kg (173 lb)] 78.5  kg (173 lb) (04/02 0849)  Physical Examination:  Anxious, awake and oriented no acute neurological deficits BiPAP 12/5 40%, bilateral equal air entry no adventitious sounds S1 and S2 are audible and no murmur Benign abdominal exam was normal peristalsis No leg edema  ASSESSMENT / PLAN:  Acute respiratory failure tolerating BiPAP -Monitor ABG, work of breathing and optimize BiPAP settings  COPD exacerbation with history of emphysema and continues to smoke -Empiric  Levaquin, bronchodilators, tapering steroids, Roflumilast. Start on Theophylline and monitor drug level. -PE is ruled out with CT angios chest and no acute consolidations  Hypertension -Optimize antihypertensives and monitor hemodynamics.  Hyponatremia and hypochloremia with volume depletion -Optimize volume and monitor renal panel.  Anxiety -Xanax and titrate Prcedex.   Elevated troponin likely secondary to demand versus supply mismatch -Monitor cardiac enzymes and echocardiogram.  Full code  DVT and GI prophylaxis.  Continue with supportive care  Critical care time 45 minutes

## 2018-01-05 NOTE — Progress Notes (Signed)
Pt was transported to CT from ED and back while on the bipap.

## 2018-01-05 NOTE — Progress Notes (Signed)
Pt admitted to unit on bipap. Patient has become increasingly agitated, has taken off the bipap and has refused to wear the bipap. Patient remains alert and oriented. Pt has been educated on the risk of needing to be intubated if she doesn't wear it, pt stated that she would rather in intubated than wear it. Stated "I just want to be knocked out." MD notified. Pt has agreed to try and wear the bipap if she is on precedex. Precedex ordered. Once patient has calmed down, bipap will be placed placed back on. Patient's O2 at 96, but patient breathing remains labored.

## 2018-01-06 ENCOUNTER — Inpatient Hospital Stay: Payer: Medicare Other

## 2018-01-06 ENCOUNTER — Inpatient Hospital Stay: Admit: 2018-01-06 | Payer: Medicare Other

## 2018-01-06 LAB — BASIC METABOLIC PANEL
Anion gap: 8 (ref 5–15)
BUN: 23 mg/dL — AB (ref 6–20)
CALCIUM: 8.9 mg/dL (ref 8.9–10.3)
CO2: 25 mmol/L (ref 22–32)
Chloride: 102 mmol/L (ref 101–111)
Creatinine, Ser: 0.97 mg/dL (ref 0.44–1.00)
GFR calc Af Amer: >60 mL/min (ref >60)
GLUCOSE: 139 mg/dL — AB (ref 65–99)
Potassium: 5 mmol/L (ref 3.5–5.1)
Sodium: 135 mmol/L (ref 135–145)

## 2018-01-06 LAB — PHOSPHORUS: Phosphorus: 4.7 mg/dL — ABNORMAL HIGH (ref 2.5–4.6)

## 2018-01-06 LAB — CBC WITH DIFFERENTIAL/PLATELET
Basophils Absolute: 0 10*3/uL (ref 0–0.1)
Basophils Relative: 0 %
EOS ABS: 0 10*3/uL (ref 0–0.7)
Eosinophils Relative: 0 %
HCT: 36.4 % (ref 35.0–47.0)
HEMOGLOBIN: 11.9 g/dL — AB (ref 12.0–16.0)
Lymphocytes Relative: 8 %
Lymphs Abs: 1.4 10*3/uL (ref 1.0–3.6)
MCH: 30.7 pg (ref 26.0–34.0)
MCHC: 32.8 g/dL (ref 32.0–36.0)
MCV: 93.5 fL (ref 80.0–100.0)
MONOS PCT: 4 %
Monocytes Absolute: 0.7 10*3/uL (ref 0.2–0.9)
NEUTROS PCT: 88 %
Neutro Abs: 15.9 10*3/uL — ABNORMAL HIGH (ref 1.4–6.5)
Platelets: 311 10*3/uL (ref 150–440)
RBC: 3.89 MIL/uL (ref 3.80–5.20)
RDW: 14 % (ref 11.5–14.5)
WBC: 18.1 10*3/uL — ABNORMAL HIGH (ref 3.6–11.0)

## 2018-01-06 LAB — MAGNESIUM: Magnesium: 2.1 mg/dL (ref 1.7–2.4)

## 2018-01-06 MED ORDER — SODIUM CHLORIDE 0.9 % IV SOLN
1.0000 g | Freq: Every day | INTRAVENOUS | Status: DC
  Administered 2018-01-06 – 2018-01-07 (×2): 1 g via INTRAVENOUS
  Filled 2018-01-06 (×3): qty 10

## 2018-01-06 MED ORDER — SODIUM CHLORIDE 0.9 % IV SOLN
INTRAVENOUS | Status: DC
  Administered 2018-01-06 – 2018-01-07 (×3): via INTRAVENOUS

## 2018-01-06 MED ORDER — LORAZEPAM 2 MG/ML IJ SOLN
2.0000 mg | Freq: Once | INTRAMUSCULAR | Status: AC
  Administered 2018-01-06: 2 mg via INTRAVENOUS

## 2018-01-06 MED ORDER — LORAZEPAM 2 MG/ML IJ SOLN
INTRAMUSCULAR | Status: AC
  Filled 2018-01-06: qty 1

## 2018-01-06 MED ORDER — BUDESONIDE 0.25 MG/2ML IN SUSP
0.2500 mg | Freq: Two times a day (BID) | RESPIRATORY_TRACT | Status: DC
  Administered 2018-01-06 – 2018-01-07 (×3): 0.25 mg via RESPIRATORY_TRACT
  Filled 2018-01-06 (×3): qty 2

## 2018-01-06 MED ORDER — PREDNISONE 20 MG PO TABS
40.0000 mg | ORAL_TABLET | Freq: Every day | ORAL | Status: DC
  Administered 2018-01-07: 40 mg via ORAL
  Filled 2018-01-06 (×2): qty 2

## 2018-01-06 NOTE — Progress Notes (Signed)
[facility] [place] at [facility]   PATIENT NAME: Alicia Rivera    MR#:  [id number]  DATE OF BIRTH:  Jan 13, 1956  SUBJECTIVE:  CHIEF COMPLAINT: Shortness of breath is better, off BiPAP  REVIEW OF SYSTEMS:  CONSTITUTIONAL: No fever, fatigue or weakness.  EYES: No blurred or double vision.  EARS, NOSE, AND THROAT: No tinnitus or ear pain.  RESPIRATORY: Improving cough, shortness of breath, still wheezing CARDIOVASCULAR: No chest pain, orthopnea, edema.  GASTROINTESTINAL: No nausea, vomiting, diarrhea or abdominal pain.  GENITOURINARY: No dysuria, hematuria.  ENDOCRINE: No polyuria, nocturia,  HEMATOLOGY: No anemia, easy bruising or bleeding SKIN: No rash or lesion. MUSCULOSKELETAL: No joint pain or arthritis.   NEUROLOGIC: No tingling, numbness, weakness.  PSYCHIATRY: No anxiety or depression.   DRUG ALLERGIES:   Allergies  Allergen Reactions  . Other Hives    trilogy  . Trelegy Ellipta [Fluticasone-Umeclidin-Vilant] Anaphylaxis  . Sulfa Antibiotics   . Sulfasalazine Hives    VITALS:  Blood pressure 110/68, pulse (!) 52, temperature (!) 97.2 F (36.2 C), temperature source Axillary, resp. rate 15, height 5\' 5"  (1.651 m), weight 78.5 kg (173 lb), SpO2 99 %.  PHYSICAL EXAMINATION:  GENERAL:  62 y.o.-year-old patient lying in the bed with no acute distress.  EYES: Pupils equal, round, reactive to light and accommodation. No scleral icterus. Extraocular muscles intact.  HEENT: Head atraumatic, normocephalic. Oropharynx and nasopharynx clear.  NECK:  Supple, no jugular venous distention. No thyroid enlargement, no tenderness.  LUNGS: Diminished breath sounds bilaterally,  wheezing, no rales,rhonchi or crepitation. No use of accessory muscles of respiration.  CARDIOVASCULAR: S1, S2 normal. No murmurs, rubs, or gallops.  ABDOMEN: Soft, nontender, nondistended. Bowel sounds present. No organomegaly or mass.  EXTREMITIES: No pedal edema, cyanosis, or  clubbing.  NEUROLOGIC: Cranial nerves II through XII are intact. Muscle strength 5/5 in all extremities. Sensation intact. Gait not checked.  PSYCHIATRIC: The patient is alert and oriented x 3.  SKIN: No obvious rash, lesion, or ulcer.    LABORATORY PANEL:   CBC Recent Labs  Lab 01/06/18 0640  WBC 18.1*  HGB 11.9*  HCT 36.4  PLT 311   ------------------------------------------------------------------------------------------------------------------  Chemistries  Recent Labs  Lab 01/06/18 0640  NA 135  K 5.0  CL 102  CO2 25  GLUCOSE 139*  BUN 23*  CREATININE 0.97  CALCIUM 8.9  MG 2.1   ------------------------------------------------------------------------------------------------------------------  Cardiac Enzymes Recent Labs  Lab 01/05/18 1851  TROPONINI 0.07*   ------------------------------------------------------------------------------------------------------------------  RADIOLOGY:  Ct Angio Chest Pe W And/or Wo Contrast  Result Date: 01/05/2018 CLINICAL DATA:  62 year old female with shortness of breath, hypoxia. Leukocytosis and elevated troponin. EXAM: CT ANGIOGRAPHY CHEST WITH CONTRAST TECHNIQUE: Multidetector CT imaging of the chest was performed using the standard protocol during bolus administration of intravenous contrast. Multiplanar CT image reconstructions and MIPs were obtained to evaluate the vascular anatomy. CONTRAST:  75mL OMNIPAQUE IOHEXOL 350 MG/ML SOLN COMPARISON:  Chest CTA 04/12/2017. FINDINGS: Cardiovascular: Good contrast bolus timing in the pulmonary arterial tree. Mild lower lobe respiratory motion. No focal filling defect identified in the pulmonary arteries to suggest acute pulmonary embolism. Calcified aortic atherosclerosis. Calcified coronary artery atherosclerosis. No cardiomegaly or pericardial effusion. Mediastinum/Nodes: Negative.  No lymphadenopathy. Lungs/Pleura: Large lung volumes with chronic emphysema. Trace retained secretions  or cine key I in the trachea and mainstem bronchi. Otherwise the major airways are patent. There is mild generalized peribronchial thickening. Larger lung volumes compared to 2018. No pleural effusion. No acute or  confluent pulmonary opacity. Upper Abdomen: Calcified aortic atherosclerosis. Negative visible upper abdominal viscera. Musculoskeletal: Osteopenia. Chronic compression fractures of the T3, T7, T8, and T9 vertebrae. Mild anterolisthesis at C7-T1 with facet degeneration. Small C7 cervical ribs. No acute osseous abnormality identified. Review of the MIP images confirms the above findings. IMPRESSION: 1.  Negative for acute pulmonary embolus. 2.  Emphysema (ICD10-J43.9).  No acute pulmonary abnormality. 3. Calcified coronary artery and aortic Atherosclerosis (ICD10-I70.0). No cardiomegaly. 4. Stable chronic thoracic compression fractures. Electronically Signed   By: Odessa Fleming M.D.   On: 01/05/2018 10:36   Dg Chest Port 1 View  Result Date: 01/06/2018 CLINICAL DATA:  COPD EXAM: PORTABLE CHEST 1 VIEW COMPARISON:  Chest radiograph and chest CT January 05, 2018; April 16, 2017 FINDINGS: Changes of underlying emphysema are stable. There is no appreciable edema or consolidation. Areas of mild interstitial thickening are stable and felt to represent chronic inflammatory type change. Heart size is normal. The pulmonary vascularity reflects the underlying emphysematous change is stable. No adenopathy. No evident bone lesions. IMPRESSION: Underlying emphysematous change, stable. Mild interstitial thickening in the bases is stable. No edema or consolidation. Stable cardiac silhouette. No adenopathy evident. Electronically Signed   By: Bretta Bang III M.D.   On: 01/06/2018 07:40   Dg Chest Port 1 View  Result Date: 01/05/2018 CLINICAL DATA:  Shortness of breath, hypoxic on room air. History of COPD. Current smoker. EXAM: PORTABLE CHEST 1 VIEW COMPARISON:  Chest x-ray of September 18, 2017 FINDINGS: The lungs are  hyperinflated. The lung markings are coarse in the infrahilar regions bilaterally but this is not new. There is no alveolar infiltrate or pleural effusion. The heart and pulmonary vascularity are normal. There is calcification in the wall of the aortic arch. The bony thorax is unremarkable. IMPRESSION: Hyperinflation consistent with COPD. Mild chronic fibrotic changes in the lower lobes. No alveolar pneumonia nor CHF. Thoracic aortic atherosclerosis. Electronically Signed   By: David  [place] M.D.   On: 01/05/2018 09:05    EKG:   Orders placed or performed during the hospital encounter of 01/05/18  . ED EKG  . ED EKG  . EKG 12-Lead  . EKG 12-Lead  . EKG 12-Lead  . EKG 12-Lead    ASSESSMENT AND PLAN:   62 year old female patient with history of hypertension, emphysema, tobacco abuse presented to the emergency room with shortness of breath, wheezing and respiratory distress.  1.  Acute hypoxic respiratory failure Continue BiPAP as needed for respiratory distress currently on BiPAP.  discussed with intensivist  2.  Acute COPD exacerbation IV Solu-Medrol 60 mg taper as tolerated Nebulization treatment aggressively BiPAP for respiratory distress as needed Intensivist following IV Levaquin antibiotic  3.  Hyponatremia Resolved  4.  Leukocytosis secondary to steroids  5.  Tobacco abuse Tobacco cessation counseled to the patient for 6 minutes Nicotine patch offered Harmful effects of smoking on respiratory system cardiovascular system and nervous system explained.  6.  Anxiety disorder Continue Xanax as needed for anxiety  7.  Elevated troponin Possibly from demand ischemia Non-trending troponin 0 0.08-0.07       All the records are reviewed and case discussed with Care Management/Social Workerr. Management plans discussed with the patient, family and they are in agreement.  CODE STATUS: FC   TOTAL TIME TAKING CARE OF THIS PATIENT: 35  minutes.   POSSIBLE D/C  IN 2-3  DAYS, DEPENDING ON CLINICAL CONDITION.  Note: This dictation was prepared with Dragon dictation along with smaller phrase  technology. Any transcriptional errors that result from this process are unintentional.   Ramonita LabAruna Kerrie Latour M.D on 01/06/2018 at 5:19 PM  Between 7am to 6pm - Pager - [phone] After 6pm go to [email or web address] - password [email or web address] [facility]  [facility]  Office  [phone]  CC: Primary care physician; Carlean JewsBoscia, Heather E, NP

## 2018-01-06 NOTE — Progress Notes (Signed)
Updated family that the nurse practitioner is [facility].

## 2018-01-06 NOTE — Progress Notes (Signed)
He is also concerned that she is "getting fidgety" and that she "needs more medication."  Increased precedex gtt to 1.552mcg/kg/hr.  Pt appears to be resting comfortably.  No changes in vital signs.  Annabelle Harmanana, NP at bedside to assess the patient.  Will continue to monitor for agitation.  No new orders at this time.

## 2018-01-06 NOTE — Progress Notes (Signed)
Pt's daughter removed Bi-PAP without notification of staff to provide a mouth swab.  Assistance provided in replacing mask.

## 2018-01-06 NOTE — Progress Notes (Signed)
1830: Patient became very agitated while she was wearing the bipap. Stating that she wanted to drink water. I told the patient I couldn't give her water, but she could have mouthswabs. Patient became extremely agitated stating that she couldn't breathe. Patient O2 sat was at 99% and patient RR was 22. Patient took her bipap off and started to scream at me. Saying that she wasn't going to wear the mask and "Bring me my son right now." Son was notified and came up to see her. Patient could be heard yelling at her son from the nursing station, she was extremely agitated. MD was notified and 2mg  of ativan was given to her. Patient was given some mouthswabs and patient held the bipap against her mouth. Her son, Bernette RedbirdKenny, came out and demanded to get the names of the security guards that were standing nearby and stated he was going to file a complaint. Bernette RedbirdKenny stated that he wanted to talk to the director immediately and that he wanted [facility]. Chari ManningBecca came in to talk to the son and family. Patient agreed to have the bipap back on. She has calmed down considerably. VSS.

## 2018-01-06 NOTE — Progress Notes (Signed)
Name: Alicia Rivera MRN: [id number] DOB: 07/03/1956     CONSULTATION DATE: 01/05/2018 Anxious on BiPAP and has been started on Precedex. Able to tolerate [place].  PAST MEDICAL HISTORY :   has a past medical history of COPD (chronic obstructive pulmonary disease) (HCC), HTN (hypertension), Pneumonia (09/18/2017), and Tobacco abuse.  has a past surgical history that includes none. Prior to Admission medications   Medication Sig Start Date End Date Taking? Authorizing Provider  albuterol (PROVENTIL HFA;VENTOLIN HFA) 108 (90 Base) MCG/ACT inhaler Inhale 2 puffs into the lungs every 6 (six) hours as needed for wheezing or shortness of breath. 09/18/17  Yes Veronese, [place], MD  ALPRAZolam Prudy Feeler(XANAX) 0.25 MG tablet TAKE 1/2 TABLET BY MOUTH TWICE DAILY AS NEEDED 12/03/17  Yes Lyndon CodeKhan, Fozia M, MD  aspirin EC 81 MG tablet Take 81 mg by mouth daily.   Yes [provider]  cetirizine (ZYRTEC) 10 MG tablet Take 10 mg by mouth daily. 01/04/18  Yes [provider]  DALIRESP 500 MCG TABS tablet Take 1 tablet by mouth daily. 04/02/17  Yes [provider]  DULoxetine (CYMBALTA) 20 MG capsule Take 2 capsules (40 mg total) by mouth every morning. 12/09/17  Yes Boscia, Heather E, NP  hydrochlorothiazide (HYDRODIURIL) 25 MG tablet Take 25 mg by mouth daily.  02/03/17  Yes [provider]  ipratropium-albuterol (DUONEB) 0.5-2.5 (3) MG/3ML SOLN Take 3 mLs by nebulization every 6 (six) hours as needed. 12/02/17  Yes Yevonne PaxKhan, Saadat A, MD  levofloxacin (LEVAQUIN) 750 MG tablet Take 750 mg by mouth daily. 01/04/18 01/08/18 Yes [provider]  losartan (COZAAR) 25 MG tablet Take 1 tablet (25 mg total) by mouth 2 (two) times daily. Patient taking differently: Take 25 mg by mouth daily.  12/21/17  Yes Boscia, Heather E, NP  montelukast (SINGULAIR) 10 MG tablet TAKE 1 TABLET BY MOUTH EVERY DAY 01/04/18  Yes Boscia, Heather E, NP  predniSONE (STERAPRED UNI-PAK 21 TAB) 10 MG (21) TBPK tablet As directed  11/23/17  Yes Yevonne PaxKhan, Saadat A, MD  SPIRIVA HANDIHALER 18 MCG inhalation capsule Place 1 capsule (18 mcg total) into inhaler and inhale daily. 04/23/17  Yes Houston SirenSainani, Vivek J, MD  SYMBICORT 160-4.5 MCG/ACT inhaler Inhale 1 puff into the lungs 2 (two) times daily. 04/23/17  Yes Houston SirenSainani, Vivek J, MD   Allergies  Allergen Reactions  . Other Hives    trilogy  . Trelegy Ellipta [Fluticasone-Umeclidin-Vilant] Anaphylaxis  . Sulfa Antibiotics   . Sulfasalazine Hives    FAMILY HISTORY:  family history includes Emphysema in her brother; Heart disease in her father; Lung disease in her mother. SOCIAL HISTORY:  reports that she has been smoking.  She has quit using smokeless tobacco. She reports that she does not drink alcohol or use drugs.  REVIEW OF SYSTEMS:   Unable to obtain due to critical illness   VITAL SIGNS: Temp:  [96.9 F (36.1 C)-98.2 F (36.8 C)] 98.2 F (36.8 C) (04/03 0500) Pulse Rate:  [55-121] 56 (04/03 0800) Resp:  [16-33] 16 (04/03 0800) BP: (84-168)/(57-102) 111/61 (04/03 0800) SpO2:  [95 %-100 %] 95 % (04/03 0800) FiO2 (%):  [4 %-32 %] 32 % (04/02 1900) Weight:  [78.5 kg (173 lb)] 78.5 kg (173 lb) (04/02 0849)  Physical Examination:  RASS  -1 on Precedex, awake and oriented no acute neurological deficits [place], bilateral equal air entry and diffuse expiratory wheezes S1 and S2 are audible and no murmur Benign abdominal exam was normal peristalsis No leg edema  ASSESSMENT / PLAN:  Acute respiratory failure tolerating [place], PRN BiPAP -Monitor ABG, work of breathing and optimize BiPAP settings  COPD exacerbation with history of emphysema and continues to smoke -Empiric Levaquin, bronchodilators and tapering steroids. Started on Roflumilast. -PE is ruled out with CT angios chest and no acute consolidations  Hypertension -Optimize antihypertensives and monitor hemodynamics.  Prerenal azotemia with volume depletion and catabolic effect of steroids. Hyponatremia and  hypochloremia (improved) with volume depletion -Optimize volume and monitor renal panel.  Anxiety -Optimize Xanax and taper off Precedex as toelrated.  Elevated troponin likely secondary to demand versus supply mismatch -Monitor cardiac enzymes and echocardiogram.  Full code  DVT and GI prophylaxis.  Continue with supportive care  Critical care time 35 min

## 2018-01-06 NOTE — Progress Notes (Signed)
Name: Alicia Rivera MRN: [id number] DOB: Oct 20, 1955     CONSULTATION DATE: 01/05/2018  Remains on BiPAP and no major issues last night  PAST MEDICAL HISTORY :   has a past medical history of COPD (chronic obstructive pulmonary disease) (HCC), HTN (hypertension), Pneumonia (09/18/2017), and Tobacco abuse.  has a past surgical history that includes none. Prior to Admission medications   Medication Sig Start Date End Date Taking? Authorizing Provider  albuterol (PROVENTIL HFA;VENTOLIN HFA) 108 (90 Base) MCG/ACT inhaler Inhale 2 puffs into the lungs every 6 (six) hours as needed for wheezing or shortness of breath. 09/18/17  Yes Veronese, [place], MD  ALPRAZolam Prudy Feeler) 0.25 MG tablet TAKE 1/2 TABLET BY MOUTH TWICE DAILY AS NEEDED 12/03/17  Yes Lyndon Code, MD  aspirin EC 81 MG tablet Take 81 mg by mouth daily.   Yes [provider]  cetirizine (ZYRTEC) 10 MG tablet Take 10 mg by mouth daily. 01/04/18  Yes [provider]  DALIRESP 500 MCG TABS tablet Take 1 tablet by mouth daily. 04/02/17  Yes [provider]  DULoxetine (CYMBALTA) 20 MG capsule Take 2 capsules (40 mg total) by mouth every morning. 12/09/17  Yes Boscia, Heather E, NP  hydrochlorothiazide (HYDRODIURIL) 25 MG tablet Take 25 mg by mouth daily.  02/03/17  Yes [provider]  ipratropium-albuterol (DUONEB) 0.5-2.5 (3) MG/3ML SOLN Take 3 mLs by nebulization every 6 (six) hours as needed. 12/02/17  Yes Yevonne Pax, MD  levofloxacin (LEVAQUIN) 750 MG tablet Take 750 mg by mouth daily. 01/04/18 01/08/18 Yes [provider]  losartan (COZAAR) 25 MG tablet Take 1 tablet (25 mg total) by mouth 2 (two) times daily. Patient taking differently: Take 25 mg by mouth daily.  12/21/17  Yes Boscia, Heather E, NP  montelukast (SINGULAIR) 10 MG tablet TAKE 1 TABLET BY MOUTH EVERY DAY 01/04/18  Yes Boscia, Heather E, NP  predniSONE (STERAPRED UNI-PAK 21 TAB) 10 MG (21) TBPK tablet As directed 11/23/17  Yes Yevonne Pax, MD  SPIRIVA HANDIHALER 18 MCG inhalation capsule Place 1 capsule (18 mcg total) into inhaler and inhale daily. 04/23/17  Yes [place] Siren, MD  SYMBICORT 160-4.5 MCG/ACT inhaler Inhale 1 puff into the lungs 2 (two) times daily. 04/23/17  Yes Houston Siren, MD   Allergies  Allergen Reactions  . Other Hives    trilogy  . Trelegy Ellipta [Fluticasone-Umeclidin-Vilant] Anaphylaxis  . Sulfa Antibiotics   . Sulfasalazine Hives    FAMILY HISTORY:  family history includes Emphysema in her brother; Heart disease in her father; Lung disease in her mother. SOCIAL HISTORY:  reports that she has been smoking.  She has quit using smokeless tobacco. She reports that she does not drink alcohol or use drugs.  REVIEW OF SYSTEMS:   Unable to obtain due to critical illness   VITAL SIGNS: Temp:  [96.1 F (35.6 C)-98.2 F (36.8 C)] 96.1 F (35.6 C) (04/03 0800) Pulse Rate:  [50-110] 87 (04/03 1200) Resp:  [14-31] 23 (04/03 1200) BP: (84-168)/(57-93) 151/89 (04/03 1200) SpO2:  [95 %-100 %] 96 % (04/03 1200) FiO2 (%):  [32 %] 32 % (04/02 1900)  Physical Examination:  Anxious, awake and oriented no acute neurological deficits BiPAP 12/5 40%, bilateral equal air entry no adventitious sounds S1 and S2 are audible and no murmur Benign abdominal exam was normal peristalsis No leg edema  ASSESSMENT / PLAN: Acute respiratory failure tolerating BiPAP / [place]. -Monitor ABG, work of breathing and optimize BiPAP settings.  COPD exacerbation with history of emphysema and continues to smoke -Empiric Rocephine, bronchodilator,  inhaled steroids and tapering steroids. Started on Roflumilast. -PE is ruled out with CT angios chest and no acute consolidations  Hypertension -Optimize antihypertensives and monitor hemodynamics.  Prerenal azotemia with volume depletion -Optimize volume and monitor renal panel.  Anxiety -Xanax  Elevated troponin likely secondary to demand versus supply  mismatch -Monitor cardiac enzymes and echocardiogram.  No PE as per CTA chest  Anemia -Keep Hb > 7 gm/dl.  Full code  DVT and GI prophylaxis.  Continue with supportive care  Critical care time 35 min

## 2018-01-07 ENCOUNTER — Inpatient Hospital Stay
Admit: 2018-01-07 | Discharge: 2018-01-07 | Disposition: A | Discharge status: Home / Self Care | Payer: Medicare Other | Attending: Internal Medicine | Admitting: Internal Medicine

## 2018-01-07 LAB — CBC WITH DIFFERENTIAL/PLATELET
BASOS ABS: 0 10*3/uL (ref 0–0.1)
Basophils Relative: 0 %
EOS PCT: 0 %
Eosinophils Absolute: 0 10*3/uL (ref 0–0.7)
HCT: 35.5 % (ref 35.0–47.0)
Hemoglobin: 11.8 g/dL — ABNORMAL LOW (ref 12.0–16.0)
LYMPHS PCT: 10 %
Lymphs Abs: 2.2 10*3/uL (ref 1.0–3.6)
MCH: 30.9 pg (ref 26.0–34.0)
MCHC: 33.1 g/dL (ref 32.0–36.0)
MCV: 93.4 fL (ref 80.0–100.0)
Monocytes Absolute: 1.4 10*3/uL — ABNORMAL HIGH (ref 0.2–0.9)
Monocytes Relative: 7 %
Neutro Abs: 17.4 10*3/uL — ABNORMAL HIGH (ref 1.4–6.5)
Neutrophils Relative %: 83 %
PLATELETS: 291 10*3/uL (ref 150–440)
RBC: 3.8 MIL/uL (ref 3.80–5.20)
RDW: 13.6 % (ref 11.5–14.5)
WBC: 21 10*3/uL — AB (ref 3.6–11.0)

## 2018-01-07 LAB — BLOOD GAS, ARTERIAL
Acid-Base Excess: 0 mmol/L (ref 0.0–2.0)
Bicarbonate: 24.8 mmol/L (ref 20.0–28.0)
DELIVERY SYSTEMS: POSITIVE
FIO2: 0.32
O2 Saturation: 98.6 %
PATIENT TEMPERATURE: 35.9
pCO2 arterial: 40 mmHg (ref 32.0–48.0)
pH, Arterial: 7.42 (ref 7.350–7.450)
pO2, Arterial: 110 mmHg — ABNORMAL HIGH (ref 83.0–108.0)

## 2018-01-07 LAB — BASIC METABOLIC PANEL
ANION GAP: 6 (ref 5–15)
BUN: 32 mg/dL — ABNORMAL HIGH (ref 6–20)
CO2: 24 mmol/L (ref 22–32)
Calcium: 8.7 mg/dL — ABNORMAL LOW (ref 8.9–10.3)
Chloride: 106 mmol/L (ref 101–111)
Creatinine, Ser: 0.75 mg/dL (ref 0.44–1.00)
GFR calc Af Amer: >60 mL/min (ref >60)
Glucose, Bld: 130 mg/dL — ABNORMAL HIGH (ref 65–99)
Potassium: 4 mmol/L (ref 3.5–5.1)
SODIUM: 136 mmol/L (ref 135–145)

## 2018-01-07 LAB — PHOSPHORUS: PHOSPHORUS: 3.6 mg/dL (ref 2.5–4.6)

## 2018-01-07 LAB — CALCIUM, IONIZED: Calcium, Ionized, Serum: 5 mg/dL (ref 4.5–5.6)

## 2018-01-07 LAB — MAGNESIUM: MAGNESIUM: 2.2 mg/dL (ref 1.7–2.4)

## 2018-01-07 LAB — ECHOCARDIOGRAM COMPLETE
Height: 65 in
WEIGHTICAEL: 2768 [oz_av]

## 2018-01-07 MED ORDER — THEOPHYLLINE 80 MG/15ML PO ELIX
200.0000 mg | ORAL_SOLUTION | Freq: Three times a day (TID) | ORAL | Status: DC
  Administered 2018-01-07: 200 mg via ORAL
  Filled 2018-01-07 (×4): qty 37.5

## 2018-01-07 MED ORDER — FENTANYL CITRATE (PF) 100 MCG/2ML IJ SOLN: 25.0000 ug | INTRAMUSCULAR | Status: DC

## 2018-01-07 MED ORDER — ENOXAPARIN SODIUM 40 MG/0.4ML ~~LOC~~ SOLN: 40.0000 mg | Freq: Every day | SUBCUTANEOUS | Status: DC

## 2018-01-07 MED ORDER — ONDANSETRON HCL 4 MG PO TABS: 4.0000 mg | ORAL_TABLET | Freq: Four times a day (QID) | ORAL | 0 refills | Status: DC | PRN

## 2018-01-07 MED ORDER — PREDNISONE 20 MG PO TABS: 40.0000 mg | ORAL_TABLET | Freq: Every day | ORAL | Status: DC

## 2018-01-07 MED ORDER — ACETAMINOPHEN 650 MG RE SUPP: 650.0000 mg | Freq: Four times a day (QID) | RECTAL | 0 refills | Status: DC | PRN

## 2018-01-07 MED ORDER — FENTANYL CITRATE (PF) 100 MCG/2ML IJ SOLN
25.0000 ug | INTRAMUSCULAR | Status: DC | PRN
  Administered 2018-01-07 – 2018-01-08 (×3): 25 ug via INTRAVENOUS
  Filled 2018-01-07 (×3): qty 2

## 2018-01-07 MED ORDER — BUDESONIDE 0.5 MG/2ML IN SUSP
0.5000 mg | Freq: Two times a day (BID) | RESPIRATORY_TRACT | Status: DC
  Administered 2018-01-07 (×2): 0.5 mg via RESPIRATORY_TRACT
  Filled 2018-01-07 (×2): qty 2

## 2018-01-07 MED ORDER — SODIUM CHLORIDE 0.9% FLUSH: 3.0000 mL | INTRAVENOUS | Status: DC | PRN

## 2018-01-07 MED ORDER — BUDESONIDE 0.25 MG/2ML IN SUSP: 0.2500 mg | Freq: Two times a day (BID) | RESPIRATORY_TRACT | 12 refills | Status: DC

## 2018-01-07 MED ORDER — HYDRALAZINE HCL 20 MG/ML IJ SOLN
10.0000 mg | Freq: Once | INTRAMUSCULAR | Status: AC
  Administered 2018-01-07: 10 mg via INTRAVENOUS
  Filled 2018-01-07: qty 1

## 2018-01-07 MED ORDER — IPRATROPIUM-ALBUTEROL 0.5-2.5 (3) MG/3ML IN SOLN
3.0000 mL | RESPIRATORY_TRACT | Status: DC | PRN
  Administered 2018-01-07: 3 mL via RESPIRATORY_TRACT
  Filled 2018-01-07: qty 3

## 2018-01-07 MED ORDER — IPRATROPIUM-ALBUTEROL 0.5-2.5 (3) MG/3ML IN SOLN
3.0000 mL | Freq: Four times a day (QID) | RESPIRATORY_TRACT | Status: DC
  Administered 2018-01-07 – 2018-01-08 (×2): 3 mL via RESPIRATORY_TRACT
  Filled 2018-01-07 (×3): qty 3

## 2018-01-07 MED ORDER — ONDANSETRON HCL 4 MG/2ML IJ SOLN: 4.0000 mg | Freq: Four times a day (QID) | INTRAMUSCULAR | 0 refills | Status: DC | PRN

## 2018-01-07 MED ORDER — SENNOSIDES-DOCUSATE SODIUM 8.6-50 MG PO TABS: 1.0000 | ORAL_TABLET | Freq: Every evening | ORAL | Status: DC | PRN

## 2018-01-07 MED ORDER — DEXMEDETOMIDINE HCL IN NACL 400 MCG/100ML IV SOLN: 0.4000 ug/kg/h | INTRAVENOUS | Status: DC

## 2018-01-07 MED ORDER — DULOXETINE HCL 40 MG PO CPEP: 40.0000 mg | ORAL_CAPSULE | ORAL | 3 refills | Status: DC

## 2018-01-07 MED ORDER — ALPRAZOLAM 0.25 MG PO TABS: 0.2500 mg | ORAL_TABLET | Freq: Three times a day (TID) | ORAL | 0 refills | Status: DC | PRN

## 2018-01-07 MED ORDER — SODIUM CHLORIDE 0.9% FLUSH: 3.0000 mL | Freq: Two times a day (BID) | INTRAVENOUS | Status: DC

## 2018-01-07 MED ORDER — FENTANYL CITRATE (PF) 100 MCG/2ML IJ SOLN
25.0000 ug | Freq: Once | INTRAMUSCULAR | Status: AC
  Administered 2018-01-07: 25 ug via INTRAVENOUS
  Filled 2018-01-07: qty 2

## 2018-01-07 MED ORDER — NICOTINE 21 MG/24HR TD PT24: 21.0000 mg | MEDICATED_PATCH | Freq: Every day | TRANSDERMAL | 0 refills | Status: DC

## 2018-01-07 MED ORDER — ACETAMINOPHEN 325 MG PO TABS: 650.0000 mg | ORAL_TABLET | Freq: Four times a day (QID) | ORAL | Status: DC | PRN

## 2018-01-07 MED ORDER — LORATADINE 10 MG PO TABS: 10.0000 mg | ORAL_TABLET | Freq: Every day | ORAL | Status: DC

## 2018-01-07 MED ORDER — IPRATROPIUM-ALBUTEROL 0.5-2.5 (3) MG/3ML IN SOLN
3.0000 mL | Freq: Four times a day (QID) | RESPIRATORY_TRACT | Status: DC
  Administered 2018-01-07: 3 mL via RESPIRATORY_TRACT

## 2018-01-07 MED ORDER — IPRATROPIUM-ALBUTEROL 0.5-2.5 (3) MG/3ML IN SOLN: 3.0000 mL | Freq: Four times a day (QID) | RESPIRATORY_TRACT | Status: DC

## 2018-01-07 MED ORDER — SODIUM CHLORIDE 0.9 % IV SOLN: 75.0000 mL | INTRAVENOUS | 0 refills | Status: DC

## 2018-01-07 MED ORDER — SODIUM CHLORIDE 0.9 % IV SOLN: 1.0000 g | Freq: Every day | INTRAVENOUS | Status: DC

## 2018-01-07 MED ORDER — IPRATROPIUM-ALBUTEROL 0.5-2.5 (3) MG/3ML IN SOLN
RESPIRATORY_TRACT | Status: AC
  Filled 2018-01-07: qty 3

## 2018-01-07 MED FILL — Lorazepam Inj 2 MG/ML: INTRAMUSCULAR | Qty: 1 | Status: AC

## 2018-01-07 NOTE — Progress Notes (Signed)
Pt removed Bi-PAP mask citing "anxiety."  Replaced mask and increased precedex gtt from 1.4-1.626mcg/kg/hr.  Pt tolerating mask at this time.

## 2018-01-07 NOTE — Progress Notes (Signed)
Spoke to Dr. Duanne LimerickSamaan in person about patient's blood pressure of 180/103, home medications given earlier today. MD ordered one time dose of 10 mg hydralazine IV and ordered precedex dose lowered to 0.4 mg/kg/hr due to continued heart rate in 50s. Team will continue to monitor.

## 2018-01-07 NOTE — Progress Notes (Signed)
[facility] - [place] at [facility]   PATIENT NAME: Alicia Rivera    MR#:  [id number]  DATE OF BIRTH:  01/19/1956  SUBJECTIVE:  CHIEF COMPLAINT: Shortness of breath is better, off BiPAP, lethargic  REVIEW OF SYSTEMS:  Review of system unobtainable as the patient is lethargic from Precedex drip  DRUG ALLERGIES:   Allergies  Allergen Reactions  . Other Hives    trilogy  . Trelegy Ellipta [Fluticasone-Umeclidin-Vilant] Anaphylaxis  . Sulfa Antibiotics   . Sulfasalazine Hives    VITALS:  Blood pressure (!) 159/89, pulse (!) 58, temperature (!) 97 F (36.1 C), temperature source Axillary, resp. rate (!) 26, height 5\' 5"  (1.651 m), weight 78.5 kg (173 lb), SpO2 93 %.  PHYSICAL EXAMINATION:  GENERAL:  62 y.o.-year-old patient lying in the bed with no acute distress.  EYES: Pupils equal, round, reactive to light and accommodation. No scleral icterus. Extraocular muscles intact.  HEENT: Head atraumatic, normocephalic. Oropharynx and nasopharynx clear.  NECK:  Supple, no jugular venous distention. No thyroid enlargement, no tenderness.  LUNGS: Diminished breath sounds bilaterally, minimal wheezing, no rales,rhonchi or crepitation. No use of accessory muscles of respiration.  CARDIOVASCULAR: S1, S2 normal. No murmurs, rubs, or gallops.  ABDOMEN: Soft, nontender, nondistended. Bowel sounds present. No organomegaly or mass.  EXTREMITIES: No pedal edema, cyanosis, or clubbing.  NEUROLOGIC: Cranial nerves II through XII are intact. Muscle strength 5/5 in all extremities. Sensation intact. Gait not checked.  PSYCHIATRIC: The patient is alert and oriented x 3.  SKIN: No obvious rash, lesion, or ulcer.    LABORATORY PANEL:   CBC Recent Labs  Lab 01/07/18 0542  WBC 21.0*  HGB 11.8*  HCT 35.5  PLT 291   ------------------------------------------------------------------------------------------------------------------  Chemistries  Recent Labs  Lab  01/07/18 0542  NA 136  K 4.0  CL 106  CO2 24  GLUCOSE 130*  BUN 32*  CREATININE 0.75  CALCIUM 8.7*  MG 2.2   ------------------------------------------------------------------------------------------------------------------  Cardiac Enzymes Recent Labs  Lab 01/05/18 1851  TROPONINI 0.07*   ------------------------------------------------------------------------------------------------------------------  RADIOLOGY:  Dg Chest Port 1 View  Result Date: 01/06/2018 CLINICAL DATA:  COPD EXAM: PORTABLE CHEST 1 VIEW COMPARISON:  Chest radiograph and chest CT January 05, 2018; April 16, 2017 FINDINGS: Changes of underlying emphysema are stable. There is no appreciable edema or consolidation. Areas of mild interstitial thickening are stable and felt to represent chronic inflammatory type change. Heart size is normal. The pulmonary vascularity reflects the underlying emphysematous change is stable. No adenopathy. No evident bone lesions. IMPRESSION: Underlying emphysematous change, stable. Mild interstitial thickening in the bases is stable. No edema or consolidation. Stable cardiac silhouette. No adenopathy evident. Electronically Signed   By: Bretta BangWilliam  Woodruff III M.D.   On: 01/06/2018 07:40    EKG:   Orders placed or performed during the hospital encounter of 01/05/18  . ED EKG  . ED EKG  . EKG 12-Lead  . EKG 12-Lead  . EKG 12-Lead  . EKG 12-Lead    ASSESSMENT AND PLAN:   62 year old female patient with history of hypertension, emphysema, tobacco abuse presented to the emergency room with shortness of breath, wheezing and respiratory distress.  1.  Acute hypoxic respiratory failure Continue BiPAP as needed for respiratory distress currently off BiPAP.  discussed with intensivist  2.  Acute COPD exacerbation IV Solu-Medrol 60 mg taper as tolerated Nebulization treatment aggressively BiPAP for respiratory distress as needed Intensivist following, [facility]  patient's [facility] when bed  is available IV Levaquin antibiotic  3.  Hyponatremia Resolved  4.  Leukocytosis secondary to steroids  5.  Tobacco abuse Tobacco cessation counseled to the patient for 6 minutes by the admitting physician Nicotine patch  Harmful effects of smoking on respiratory system cardiovascular system and nervous system explained.  6.  Anxiety disorder Continue Xanax as needed for anxiety  7.  Elevated troponin Possibly from demand ischemia Non-trending troponin 0 0.08-0.07       All the records are reviewed and case discussed with Care Management/Social Workerr. Management plans discussed with the patient, family and they are in agreement.  CODE STATUS: FC   TOTAL TIME TAKING CARE OF THIS PATIENT: 35  minutes.   POSSIBLE D/C IN 2-3  DAYS, DEPENDING ON CLINICAL CONDITION.  Note: This dictation was prepared with Dragon dictation along with smaller phrase technology. Any transcriptional errors that result from this process are unintentional.   Ramonita Lab M.D on 01/07/2018 at 12:05 PM  Between 7am to 6pm - Pager - [phone] After 6pm go to [email or web address] - password [email or web address] [facility]  [facility]  Office  [phone]  CC: Primary care physician; Carlean Jews, NP

## 2018-01-07 NOTE — Progress Notes (Signed)
Patient had requested to come off bipap around 15:45 and was transitioned to 2 L nasal cannula [facility]. At 16:00 patient very anxious, tripod position in bed, requested to go back on bipap. Placed back on bipap, reported extreme shortness of breath, and anxiety. Stated felt like she was being "smothered". Observed increased work of breathing, audible wheezes. Precedex increased (HR 60-70), notified Dr. Duanne LimerickSamaan. Respiratory therapist at bedside, spoke with Dr. Duanne LimerickSamaan who ordered one time breathing treatment. Patient reported pain "all over" and Dr. Duanne LimerickSamaan ordered one time dose of 25 mcg of fentanyl IV.   Patient calmed after breathing treatment, fentanyl dose, and increased precedex dose, work of breathing decreased. Patient and son Dannielle HuhDanny [facility](HCPOA) confirmed code status of full code with Dr. Duanne LimerickSamaan and this RN.

## 2018-01-07 NOTE — Progress Notes (Addendum)
Name: Alicia Rivera MRN: [id number] DOB: Mar 21, 1956     CONSULTATION DATE: 01/05/2018  Remains on Prcedex, [place]/BiPAP and awaiting transfer to [facility] as per family request.   PAST MEDICAL HISTORY :   has a past medical history of COPD (chronic obstructive pulmonary disease) ([place]), HTN (hypertension), Pneumonia (09/18/2017), and Tobacco abuse.  has a past surgical history that includes none. Prior to Admission medications   Medication Sig Start Date End Date Taking? Authorizing Provider  albuterol (PROVENTIL HFA;VENTOLIN HFA) 108 (90 Base) MCG/ACT inhaler Inhale 2 puffs into the lungs every 6 (six) hours as needed for wheezing or shortness of breath. 09/18/17  Yes Veronese, [place], MD  ALPRAZolam Duanne Moron) 0.25 MG tablet TAKE 1/2 TABLET BY MOUTH TWICE DAILY AS NEEDED 12/03/17  Yes Lavera Guise, MD  aspirin EC 81 MG tablet Take 81 mg by mouth daily.   Yes [provider]  cetirizine (ZYRTEC) 10 MG tablet Take 10 mg by mouth daily. 01/04/18  Yes [provider]  DALIRESP 500 MCG TABS tablet Take 1 tablet by mouth daily. 04/02/17  Yes [provider]  DULoxetine (CYMBALTA) 20 MG capsule Take 2 capsules (40 mg total) by mouth every morning. 12/09/17  Yes Boscia, Heather E, NP  hydrochlorothiazide (HYDRODIURIL) 25 MG tablet Take 25 mg by mouth daily.  02/03/17  Yes [provider]  ipratropium-albuterol (DUONEB) 0.5-2.5 (3) MG/3ML SOLN Take 3 mLs by nebulization every 6 (six) hours as needed. 12/02/17  Yes Allyne Gee, MD  levofloxacin (LEVAQUIN) 750 MG tablet Take 750 mg by mouth daily. 01/04/18 01/08/18 Yes [provider]  losartan (COZAAR) 25 MG tablet Take 1 tablet (25 mg total) by mouth 2 (two) times daily. Patient taking differently: Take 25 mg by mouth daily.  12/21/17  Yes Boscia, Heather E, NP  montelukast (SINGULAIR) 10 MG tablet TAKE 1 TABLET BY MOUTH EVERY DAY 01/04/18  Yes Boscia, Heather E, NP  predniSONE (STERAPRED UNI-PAK 21 TAB) 10 MG (21) TBPK  tablet As directed 11/23/17  Yes Allyne Gee, MD  SPIRIVA HANDIHALER 18 MCG inhalation capsule Place 1 capsule (18 mcg total) into inhaler and inhale daily. 04/23/17  Yes Henreitta Leber, MD  SYMBICORT 160-4.5 MCG/ACT inhaler Inhale 1 puff into the lungs 2 (two) times daily. 04/23/17  Yes Henreitta Leber, MD  acetaminophen (TYLENOL) 325 MG tablet Take 2 tablets (650 mg total) by mouth every 6 (six) hours as needed for mild pain (or Fever >/= 101). 01/07/18   Awilda Bill, NP  acetaminophen (TYLENOL) 650 MG suppository Place 1 suppository (650 mg total) rectally every 6 (six) hours as needed for mild pain (or Fever >/= 101). 01/07/18   Awilda Bill, NP  ALPRAZolam Duanne Moron) 0.25 MG tablet Take 1 tablet (0.25 mg total) by mouth 3 (three) times daily as needed for anxiety. 01/07/18   Awilda Bill, NP  budesonide (PULMICORT) 0.25 MG/2ML nebulizer solution Take 2 mLs (0.25 mg total) by nebulization 2 (two) times daily. 01/07/18   Awilda Bill, NP  cefTRIAXone 1 g in sodium chloride 0.9 % 100 mL Inject 1 g into the vein daily. 01/07/18   Awilda Bill, NP  dexmedetomidine (PRECEDEX) 400 MCG/100ML SOLN Inject 31.4-141.3 mcg/hr into the vein continuous. 01/07/18   Awilda Bill, NP  DULoxetine 40 MG CPEP Take 40 mg by mouth every morning. 01/07/18   Awilda Bill, NP  enoxaparin (LOVENOX) 40 MG/0.4ML injection Inject 0.4 mLs (40 mg total) into the skin  daily at 6 PM. 01/07/18   Awilda Bill, NP  ipratropium-albuterol (DUONEB) 0.5-2.5 (3) MG/3ML SOLN Take 3 mLs by nebulization every 6 (six) hours. 01/07/18   Awilda Bill, NP  loratadine (CLARITIN) 10 MG tablet Take 1 tablet (10 mg total) by mouth daily. 01/07/18   Awilda Bill, NP  nicotine (NICODERM CQ - DOSED IN MG/24 HOURS) 21 mg/24hr patch Place 1 patch (21 mg total) onto the skin daily. 01/07/18   Awilda Bill, NP  ondansetron (ZOFRAN) 4 MG tablet Take 1 tablet (4 mg total) by mouth every 6 (six) hours as needed for nausea. 01/07/18    Awilda Bill, NP  ondansetron (ZOFRAN) 4 MG/2ML SOLN injection Inject 2 mLs (4 mg total) into the vein every 6 (six) hours as needed for nausea. 01/07/18   Awilda Bill, NP  predniSONE (DELTASONE) 20 MG tablet Take 2 tablets (40 mg total) by mouth daily with breakfast. 01/07/18   Awilda Bill, NP  senna-docusate (SENOKOT-S) 8.6-50 MG tablet Take 1 tablet by mouth at bedtime as needed for mild constipation. 01/07/18   Awilda Bill, NP  sodium chloride 0.9 % infusion Inject 75 mLs into the vein continuous. 01/07/18   Awilda Bill, NP  sodium chloride flush (NS) 0.9 % SOLN Inject 3 mLs into the vein every 12 (twelve) hours. 01/07/18   Awilda Bill, NP  sodium chloride flush (NS) 0.9 % SOLN Inject 3 mLs into the vein as needed. 01/07/18   Awilda Bill, NP   Allergies  Allergen Reactions  . Other Hives    trilogy  . Trelegy Ellipta [Fluticasone-Umeclidin-Vilant] Anaphylaxis  . Sulfa Antibiotics   . Sulfasalazine Hives    FAMILY HISTORY:  family history includes Emphysema in her brother; Heart disease in her father; Lung disease in her mother. SOCIAL HISTORY:  reports that she has been smoking.  She has quit using smokeless tobacco. She reports that she does not drink alcohol or use drugs.  REVIEW OF SYSTEMS:   Unable to obtain due to critical illness   VITAL SIGNS: Temp:  [96.6 F (35.9 C)-97.5 F (36.4 C)] 96.6 F (35.9 C) (04/03 1950) Pulse Rate:  [50-109] 52 (04/04 0815) Resp:  [14-23] 19 (04/04 0815) BP: (92-156)/(62-89) 156/88 (04/04 0800) SpO2:  [94 %-100 %] 98 % (04/04 0815) FiO2 (%):  [28 %-32 %] 28 % (04/04 0800)  Physical Examination:  RASS of -1 and no acute neurological deficits Tolerating [place], bilateral equal air entry, diffuse expiratory wheezes S1 and S2 are audible and no murmur Benign abdominal exam was normal peristalsis No leg edema  ASSESSMENT / PLAN:  Acute respiratory failure tolerating . -Monitor ABG, work of breathing and optimize  BiPAP settings  COPD exacerbation with history of emphysema and continues to smoke -Empiric Rocephin, bronchodilators and tapering steroids. Started on Roflumilast. -PE is ruled out with CT angios chest and no acute consolidations  Prerenal azotemia -Optimize hydration, taper steroids and monitor renal panel.  Hypertension -Optimize antihypertensives and monitor hemodynamics.  Hyponatremia and hypochloremia with volume depletion (improved) -Optimize volume and monitor renal panel.  Anxiety -Xanax  Elevated troponin likely secondary to demand versus supply mismatch -Monitor cardiac enzymes and echocardiogram.  Anemia -Keep HB > 7 gm/dl  Full code  DVT and GI prophylaxis.  Continue with supportive care I met with the family, they ere updated with the patient's progress and agreed to the plan of care.  Critical care time 5 min

## 2018-01-07 NOTE — Progress Notes (Signed)
*  PRELIMINARY RESULTS* Echocardiogram 2D Echocardiogram has been performed.  Alicia GulaJoan M Obera Rivera 01/07/2018, 8:45 AM

## 2018-01-07 NOTE — Progress Notes (Signed)
Pt with no urine output overnight.  Refuses bladder scan or I&O cath.

## 2018-01-07 NOTE — Discharge Summary (Signed)
Physician Discharge Summary  Patient ID: Alicia Rivera MRN: [id number] DOB/AGE: 64-May-1957 64 y.o.  Admit date: 01/05/2018 Discharge date: 01/07/2018                            DISCHARGE SUMMARY   Alicia Rivera is a 62 y.o. y/o female with a PMH of Current Everyday Smoker, Pneumonia, HTN, and COPD.  She presented to [facility] ER on 01/05/18 with worsening shortness of breath and wheezing onset of symptoms 1 day prior to presentation.  She was discharged from [facility] on 01/04/18 following treatment of COPD exacerbation with instructions to continue oral levaquin and oral steroids. Upon arrival to [facility] ER she was placed on Bipap due to acute on chronic respiratory failure, and aggressively treated with nebulized steroids.  She is a current everyday smoker.  CT Angio Chest on 01/05/18 negative for PE and CXR consistent with COPD.  She was subsequently admitted to the stepdown unit for further workup and treatment.  While in the stepdown unit she intermittently required Bipap due to increased work of breathing.  Due to significant agitation and delirium Precedex drip started on 01/05/18. Pts family requested transfer to [facility] on 01/06/18.   SIGNIFICANT DIAGNOSTIC STUDIES CT Angio Chest PE 04/2>>Negative for acute pulmonary embolus. Emphysema (ICD10-J43.9).  No acute pulmonary abnormality. Calcified coronary artery and aortic Atherosclerosis (ICD10-I70.0). No cardiomegaly. Stable chronic thoracic compression fractures.  SIGNIFICANT EVENTS 04/2-Pt admitted to [facility] Unit with AECOPD requiring Bipap   MICRO DATA  MRSA PCR 04/2>>negative   ANTIBIOTICS Levaquin 04/2>>04/3 Ceftriaxone 04/3>>  CONSULTS Intensivist   Discharge Exam: General: Resting in bed, in NAD. Neuro: A&O x 3, non-focal.  HEENT: [place]/AT. PERRL, sclerae anicteric. Cardiovascular: RRR, no M/R/G.  Lungs: Respirations even and unlabored.  CTA bilaterally, No W/R/R. Abdomen: BS x 4, soft, NT/ND.  Musculoskeletal: No gross  deformities, no edema.  Skin: Intact, warm, no rashes.   Vitals:   01/07/18 0200 01/07/18 0207 01/07/18 0300 01/07/18 0400  BP: 128/73  133/71 124/69  Pulse: (!) 55 (!) 55 (!) 56 (!) 57  Resp: 20 18 16 17   Temp:      TempSrc:      SpO2: 99% 99% 99% 98%  Weight:      Height:         Discharge Labs  BMET Recent Labs  Lab 01/05/18 0847 01/06/18 0640  NA 133* 135  K 4.7 5.0  CL 98* 102  CO2 25 25  GLUCOSE 104* 139*  BUN 15 23*  CREATININE 0.95 0.97  CALCIUM 9.2 8.9  MG  --  2.1  PHOS  --  4.7*    CBC Recent Labs  Lab 01/05/18 0847 01/06/18 0640  HGB 13.4 11.9*  HCT 40.3 36.4  WBC 33.5* 18.1*  PLT 375 311    Anti-Coagulation No results for input(s): INR in the last 168 hours.   ASSESSMENT / PLAN:  Acute respiratory failure tolerating [place], PRN BiPAP -Monitor ABG, work of breathing and optimize BiPAP settings  COPD exacerbation with history of emphysema and continues to smoke -Empiric Ceftriaxone, bronchodilators, tapering steroids, and roflumilast  -PE is ruled out with CT angios chest and no acute consolidations -Continue providing smoking cessation counseling   Hypertension -Optimize antihypertensives and monitor hemodynamics.  Prerenal azotemia with volume depletion and catabolic effect of steroids. Hyponatremia and hypochloremia (improved) with volume depletion -Optimize volume and monitor renal panel.  Anxiety -Optimize Xanax and taper off Precedex  as tolerated   Elevated troponin likely secondary to demand versus supply mismatch -Monitor cardiac enzymes and echocardiogram      Allergies as of 01/07/2018      Reactions   Other Hives   trilogy   Trelegy Ellipta [fluticasone-umeclidin-vilant] Anaphylaxis   Sulfa Antibiotics    Sulfasalazine Hives      Medication List    STOP taking these medications   albuterol 108 (90 Base) MCG/ACT inhaler Commonly known as:  PROVENTIL HFA;VENTOLIN HFA   aspirin EC 81 MG tablet   cetirizine  10 MG tablet Commonly known as:  ZYRTEC Replaced by:  loratadine 10 MG tablet   DALIRESP 500 MCG Tabs tablet Generic drug:  roflumilast   hydrochlorothiazide 25 MG tablet Commonly known as:  HYDRODIURIL   levofloxacin 750 MG tablet Commonly known as:  LEVAQUIN   losartan 25 MG tablet Commonly known as:  COZAAR   montelukast 10 MG tablet Commonly known as:  SINGULAIR   predniSONE 10 MG (21) Tbpk tablet Commonly known as:  STERAPRED UNI-PAK 21 TAB Replaced by:  predniSONE 20 MG tablet   SPIRIVA HANDIHALER 18 MCG inhalation capsule Generic drug:  tiotropium   SYMBICORT 160-4.5 MCG/ACT inhaler Generic drug:  budesonide-formoterol     TAKE these medications   acetaminophen 325 MG tablet Commonly known as:  TYLENOL Take 2 tablets (650 mg total) by mouth every 6 (six) hours as needed for mild pain (or Fever >/= 101).   acetaminophen 650 MG suppository Commonly known as:  TYLENOL Place 1 suppository (650 mg total) rectally every 6 (six) hours as needed for mild pain (or Fever >/= 101).   ALPRAZolam 0.25 MG tablet Commonly known as:  XANAX Take 1 tablet (0.25 mg total) by mouth 3 (three) times daily as needed for anxiety. What changed:  See the new instructions.   budesonide 0.25 MG/2ML nebulizer solution Commonly known as:  PULMICORT Take 2 mLs (0.25 mg total) by nebulization 2 (two) times daily.   cefTRIAXone 1 g in sodium chloride 0.9 % 100 mL Inject 1 g into the vein daily.   dexmedetomidine 400 MCG/100ML Soln Commonly known as:  PRECEDEX Inject 31.4-141.3 mcg/hr into the vein continuous.   DULoxetine HCl 40 MG Cpep Take 40 mg by mouth every morning. What changed:  medication strength   enoxaparin 40 MG/0.4ML injection Commonly known as:  LOVENOX Inject 0.4 mLs (40 mg total) into the skin daily at 6 PM.   ipratropium-albuterol 0.5-2.5 (3) MG/3ML Soln Commonly known as:  DUONEB Take 3 mLs by nebulization every 6 (six) hours. What changed:    when to take  this  reasons to take this   loratadine 10 MG tablet Commonly known as:  CLARITIN Take 1 tablet (10 mg total) by mouth daily. Replaces:  cetirizine 10 MG tablet   nicotine 21 mg/24hr patch Commonly known as:  NICODERM CQ - dosed in mg/24 hours Place 1 patch (21 mg total) onto the skin daily.   ondansetron 4 MG tablet Commonly known as:  ZOFRAN Take 1 tablet (4 mg total) by mouth every 6 (six) hours as needed for nausea.   ondansetron 4 MG/2ML Soln injection Commonly known as:  ZOFRAN Inject 2 mLs (4 mg total) into the vein every 6 (six) hours as needed for nausea.   predniSONE 20 MG tablet Commonly known as:  DELTASONE Take 2 tablets (40 mg total) by mouth daily with breakfast. Replaces:  predniSONE 10 MG (21) Tbpk tablet   senna-docusate 8.6-50 MG tablet Commonly  known as:  Senokot-S Take 1 tablet by mouth at bedtime as needed for mild constipation.   sodium chloride 0.9 % infusion Inject 75 mLs into the vein continuous.   sodium chloride flush 0.9 % Soln Commonly known as:  NS Inject 3 mLs into the vein every 12 (twelve) hours.   sodium chloride flush 0.9 % Soln Commonly known as:  NS Inject 3 mLs into the vein as needed.        [facility]

## 2018-01-08 ENCOUNTER — Inpatient Hospital Stay: Payer: Medicare Other

## 2018-01-08 DIAGNOSIS — R4182 Altered mental status, unspecified: Secondary | ICD-10-CM | POA: Diagnosis not present

## 2018-01-08 DIAGNOSIS — F419 Anxiety disorder, unspecified: Secondary | ICD-10-CM | POA: Diagnosis present

## 2018-01-08 DIAGNOSIS — N179 Acute kidney failure, unspecified: Secondary | ICD-10-CM | POA: Diagnosis not present

## 2018-01-08 DIAGNOSIS — R5381 Other malaise: Secondary | ICD-10-CM | POA: Diagnosis not present

## 2018-01-08 DIAGNOSIS — I10 Essential (primary) hypertension: Secondary | ICD-10-CM | POA: Diagnosis not present

## 2018-01-08 DIAGNOSIS — J439 Emphysema, unspecified: Secondary | ICD-10-CM | POA: Diagnosis not present

## 2018-01-08 DIAGNOSIS — J9622 Acute and chronic respiratory failure with hypercapnia: Secondary | ICD-10-CM | POA: Diagnosis not present

## 2018-01-08 DIAGNOSIS — J9621 Acute and chronic respiratory failure with hypoxia: Secondary | ICD-10-CM | POA: Diagnosis not present

## 2018-01-08 DIAGNOSIS — E870 Hyperosmolality and hypernatremia: Secondary | ICD-10-CM | POA: Diagnosis present

## 2018-01-08 DIAGNOSIS — Z7951 Long term (current) use of inhaled steroids: Secondary | ICD-10-CM | POA: Diagnosis not present

## 2018-01-08 DIAGNOSIS — Z452 Encounter for adjustment and management of vascular access device: Secondary | ICD-10-CM | POA: Diagnosis not present

## 2018-01-08 DIAGNOSIS — R911 Solitary pulmonary nodule: Secondary | ICD-10-CM | POA: Diagnosis not present

## 2018-01-08 DIAGNOSIS — R509 Fever, unspecified: Secondary | ICD-10-CM | POA: Diagnosis not present

## 2018-01-08 DIAGNOSIS — Z8673 Personal history of transient ischemic attack (TIA), and cerebral infarction without residual deficits: Secondary | ICD-10-CM | POA: Diagnosis not present

## 2018-01-08 DIAGNOSIS — R6511 Systemic inflammatory response syndrome (SIRS) of non-infectious origin with acute organ dysfunction: Secondary | ICD-10-CM | POA: Diagnosis not present

## 2018-01-08 DIAGNOSIS — G92 Toxic encephalopathy: Secondary | ICD-10-CM | POA: Diagnosis not present

## 2018-01-08 DIAGNOSIS — Z79899 Other long term (current) drug therapy: Secondary | ICD-10-CM | POA: Diagnosis not present

## 2018-01-08 DIAGNOSIS — J449 Chronic obstructive pulmonary disease, unspecified: Secondary | ICD-10-CM | POA: Diagnosis not present

## 2018-01-08 DIAGNOSIS — G9341 Metabolic encephalopathy: Secondary | ICD-10-CM | POA: Diagnosis not present

## 2018-01-08 DIAGNOSIS — J9602 Acute respiratory failure with hypercapnia: Secondary | ICD-10-CM | POA: Diagnosis not present

## 2018-01-08 DIAGNOSIS — R0603 Acute respiratory distress: Secondary | ICD-10-CM | POA: Diagnosis not present

## 2018-01-08 DIAGNOSIS — D72829 Elevated white blood cell count, unspecified: Secondary | ICD-10-CM | POA: Diagnosis not present

## 2018-01-08 DIAGNOSIS — Z882 Allergy status to sulfonamides status: Secondary | ICD-10-CM | POA: Diagnosis not present

## 2018-01-08 DIAGNOSIS — J45901 Unspecified asthma with (acute) exacerbation: Secondary | ICD-10-CM | POA: Diagnosis not present

## 2018-01-08 DIAGNOSIS — R9431 Abnormal electrocardiogram [ECG] [EKG]: Secondary | ICD-10-CM | POA: Diagnosis not present

## 2018-01-08 DIAGNOSIS — Z7952 Long term (current) use of systemic steroids: Secondary | ICD-10-CM | POA: Diagnosis not present

## 2018-01-08 DIAGNOSIS — J9601 Acute respiratory failure with hypoxia: Secondary | ICD-10-CM | POA: Diagnosis not present

## 2018-01-08 DIAGNOSIS — J189 Pneumonia, unspecified organism: Secondary | ICD-10-CM | POA: Diagnosis not present

## 2018-01-08 DIAGNOSIS — I714 Abdominal aortic aneurysm, without rupture: Secondary | ICD-10-CM | POA: Diagnosis not present

## 2018-01-08 DIAGNOSIS — M6281 Muscle weakness (generalized): Secondary | ICD-10-CM | POA: Diagnosis not present

## 2018-01-08 DIAGNOSIS — F17210 Nicotine dependence, cigarettes, uncomplicated: Secondary | ICD-10-CM | POA: Diagnosis present

## 2018-01-08 DIAGNOSIS — Z4682 Encounter for fitting and adjustment of non-vascular catheter: Secondary | ICD-10-CM | POA: Diagnosis not present

## 2018-01-08 DIAGNOSIS — R279 Unspecified lack of coordination: Secondary | ICD-10-CM | POA: Diagnosis not present

## 2018-01-08 DIAGNOSIS — I4891 Unspecified atrial fibrillation: Secondary | ICD-10-CM | POA: Diagnosis not present

## 2018-01-08 DIAGNOSIS — R748 Abnormal levels of other serum enzymes: Secondary | ICD-10-CM | POA: Diagnosis present

## 2018-01-08 DIAGNOSIS — R0600 Dyspnea, unspecified: Secondary | ICD-10-CM | POA: Diagnosis not present

## 2018-01-08 DIAGNOSIS — R918 Other nonspecific abnormal finding of lung field: Secondary | ICD-10-CM | POA: Diagnosis not present

## 2018-01-08 DIAGNOSIS — F329 Major depressive disorder, single episode, unspecified: Secondary | ICD-10-CM | POA: Diagnosis not present

## 2018-01-08 DIAGNOSIS — R000 Tachycardia, unspecified: Secondary | ICD-10-CM | POA: Diagnosis not present

## 2018-01-08 DIAGNOSIS — E8770 Fluid overload, unspecified: Secondary | ICD-10-CM | POA: Diagnosis present

## 2018-01-08 DIAGNOSIS — E873 Alkalosis: Secondary | ICD-10-CM | POA: Diagnosis present

## 2018-01-08 DIAGNOSIS — E785 Hyperlipidemia, unspecified: Secondary | ICD-10-CM | POA: Diagnosis not present

## 2018-01-08 DIAGNOSIS — E875 Hyperkalemia: Secondary | ICD-10-CM | POA: Diagnosis not present

## 2018-01-08 DIAGNOSIS — J9692 Respiratory failure, unspecified with hypercapnia: Secondary | ICD-10-CM | POA: Diagnosis not present

## 2018-01-08 DIAGNOSIS — I480 Paroxysmal atrial fibrillation: Secondary | ICD-10-CM | POA: Diagnosis not present

## 2018-01-08 DIAGNOSIS — F17208 Nicotine dependence, unspecified, with other nicotine-induced disorders: Secondary | ICD-10-CM | POA: Diagnosis not present

## 2018-01-08 DIAGNOSIS — J45909 Unspecified asthma, uncomplicated: Secondary | ICD-10-CM | POA: Diagnosis not present

## 2018-01-08 DIAGNOSIS — I499 Cardiac arrhythmia, unspecified: Secondary | ICD-10-CM | POA: Diagnosis not present

## 2018-01-08 DIAGNOSIS — Z9981 Dependence on supplemental oxygen: Secondary | ICD-10-CM | POA: Diagnosis not present

## 2018-01-08 DIAGNOSIS — J9811 Atelectasis: Secondary | ICD-10-CM | POA: Diagnosis not present

## 2018-01-08 DIAGNOSIS — J441 Chronic obstructive pulmonary disease with (acute) exacerbation: Secondary | ICD-10-CM | POA: Diagnosis not present

## 2018-01-08 DIAGNOSIS — I2510 Atherosclerotic heart disease of native coronary artery without angina pectoris: Secondary | ICD-10-CM | POA: Diagnosis not present

## 2018-01-08 DIAGNOSIS — Z7982 Long term (current) use of aspirin: Secondary | ICD-10-CM | POA: Diagnosis not present

## 2018-01-08 DIAGNOSIS — Z9911 Dependence on respirator [ventilator] status: Secondary | ICD-10-CM | POA: Diagnosis not present

## 2018-01-08 LAB — CALCIUM, IONIZED: CALCIUM, IONIZED, SERUM: 5 mg/dL (ref 4.5–5.6)

## 2018-01-08 MED ORDER — METHYLPREDNISOLONE SODIUM SUCC 125 MG IJ SOLR
60.0000 mg | INTRAMUSCULAR | Status: AC
  Administered 2018-01-08: 60 mg via INTRAVENOUS

## 2018-01-08 MED ORDER — LORAZEPAM 2 MG/ML IJ SOLN
1.0000 mg | INTRAMUSCULAR | Status: AC
  Administered 2018-01-08: 1 mg via INTRAVENOUS

## 2018-01-08 MED ORDER — METHYLPREDNISOLONE SODIUM SUCC 125 MG IJ SOLR
INTRAMUSCULAR | Status: AC
  Administered 2018-01-08: 60 mg via INTRAVENOUS
  Filled 2018-01-08: qty 2

## 2018-01-08 MED ORDER — LORAZEPAM 2 MG/ML IJ SOLN
1.0000 mg | INTRAMUSCULAR | Status: DC | PRN
  Administered 2018-01-08: 1 mg via INTRAVENOUS
  Filled 2018-01-08: qty 1

## 2018-01-08 MED ORDER — LORAZEPAM 2 MG/ML IJ SOLN
INTRAMUSCULAR | Status: AC
  Administered 2018-01-08: 1 mg via INTRAVENOUS
  Filled 2018-01-08: qty 1

## 2018-01-08 NOTE — Progress Notes (Signed)
eLink Physician-Brief Progress Note Patient Name: Alicia Rivera DOB: 09/06/1956 MRN: [id number]   Date of Service  01/08/2018  HPI/Events of Note  Stable vital signs and asymptomatic. Camera in session unremarkable for any distress.  eICU Interventions  D/C Summary signed and order placed. Med reconciliation completed.        [name] 01/08/2018, 4:03 AM

## 2018-01-08 NOTE — Progress Notes (Signed)
Pt sitting up in bed stating "I need to be intubated right now."  Precedes at 1.558mcg/kg/hr.  Fentanyl 25mcg IV x1 administered.  Annabelle Harmanana, NP At bedside.  Vital signs stable.

## 2018-01-08 NOTE — Progress Notes (Signed)
Pt left via [facility] at [phone]

## 2018-01-08 NOTE — Progress Notes (Signed)
Notified E-Link physician of patient transfer.

## 2018-01-08 NOTE — Progress Notes (Signed)
0040  Pt fell asleep post Ativan $RemoveBefor eDEID_[id number]$1mgforeDEID_CYhksaxRMdfbIiSQXsWPoQOHnlVfxyoo$1mg

## 2018-01-08 NOTE — Progress Notes (Signed)
[facility]  Gave report to Santina Evansatherine, RN - MICU, Vonna KotykJay from transfer team, and Jesusita Okaan from transport.  Pt with be admitted to Room 4307.  Notified son, Dannielle HuhDanny - HCPOA.  Notified patient of transfer.

## 2018-01-08 NOTE — Progress Notes (Signed)
Pt resting comfortably on 28% Bi-PAP.  Continuing to focus on reducing stimulation and anxiety.   Fan provided for comfort.  Linens changed due to incontinence.  Spoke with son Dannielle HuhDanny at bedside about reduction of anxiety to assist in tolerating Bi-PAP.

## 2018-01-11 ENCOUNTER — Ambulatory Visit: Payer: Self-pay | Admitting: Internal Medicine

## 2018-01-22 DIAGNOSIS — E785 Hyperlipidemia, unspecified: Secondary | ICD-10-CM | POA: Diagnosis not present

## 2018-01-22 DIAGNOSIS — R279 Unspecified lack of coordination: Secondary | ICD-10-CM | POA: Diagnosis not present

## 2018-01-22 DIAGNOSIS — J439 Emphysema, unspecified: Secondary | ICD-10-CM | POA: Diagnosis not present

## 2018-01-22 DIAGNOSIS — G9341 Metabolic encephalopathy: Secondary | ICD-10-CM | POA: Diagnosis not present

## 2018-01-22 DIAGNOSIS — J9621 Acute and chronic respiratory failure with hypoxia: Secondary | ICD-10-CM | POA: Diagnosis not present

## 2018-01-22 DIAGNOSIS — R5381 Other malaise: Secondary | ICD-10-CM | POA: Diagnosis not present

## 2018-01-22 DIAGNOSIS — M6281 Muscle weakness (generalized): Secondary | ICD-10-CM | POA: Diagnosis not present

## 2018-01-22 DIAGNOSIS — J9611 Chronic respiratory failure with hypoxia: Secondary | ICD-10-CM | POA: Diagnosis not present

## 2018-01-22 DIAGNOSIS — J449 Chronic obstructive pulmonary disease, unspecified: Secondary | ICD-10-CM | POA: Diagnosis not present

## 2018-01-22 DIAGNOSIS — F329 Major depressive disorder, single episode, unspecified: Secondary | ICD-10-CM | POA: Diagnosis not present

## 2018-01-22 DIAGNOSIS — Z9981 Dependence on supplemental oxygen: Secondary | ICD-10-CM | POA: Diagnosis not present

## 2018-01-22 DIAGNOSIS — J9622 Acute and chronic respiratory failure with hypercapnia: Secondary | ICD-10-CM | POA: Diagnosis not present

## 2018-01-22 DIAGNOSIS — J441 Chronic obstructive pulmonary disease with (acute) exacerbation: Secondary | ICD-10-CM | POA: Diagnosis not present

## 2018-01-22 DIAGNOSIS — I10 Essential (primary) hypertension: Secondary | ICD-10-CM | POA: Diagnosis not present

## 2018-01-25 DIAGNOSIS — G9341 Metabolic encephalopathy: Secondary | ICD-10-CM | POA: Insufficient documentation

## 2018-01-25 DIAGNOSIS — J441 Chronic obstructive pulmonary disease with (acute) exacerbation: Secondary | ICD-10-CM | POA: Diagnosis not present

## 2018-01-25 DIAGNOSIS — J9611 Chronic respiratory failure with hypoxia: Secondary | ICD-10-CM | POA: Diagnosis not present

## 2018-01-30 DIAGNOSIS — J9811 Atelectasis: Secondary | ICD-10-CM | POA: Diagnosis not present

## 2018-01-30 DIAGNOSIS — R778 Other specified abnormalities of plasma proteins: Secondary | ICD-10-CM | POA: Insufficient documentation

## 2018-01-30 DIAGNOSIS — R911 Solitary pulmonary nodule: Secondary | ICD-10-CM | POA: Diagnosis not present

## 2018-01-30 DIAGNOSIS — R0789 Other chest pain: Secondary | ICD-10-CM | POA: Diagnosis not present

## 2018-01-30 DIAGNOSIS — J449 Chronic obstructive pulmonary disease, unspecified: Secondary | ICD-10-CM | POA: Diagnosis not present

## 2018-01-30 DIAGNOSIS — R748 Abnormal levels of other serum enzymes: Secondary | ICD-10-CM | POA: Diagnosis not present

## 2018-01-30 DIAGNOSIS — Z7952 Long term (current) use of systemic steroids: Secondary | ICD-10-CM | POA: Diagnosis not present

## 2018-01-30 DIAGNOSIS — R0602 Shortness of breath: Secondary | ICD-10-CM | POA: Diagnosis not present

## 2018-01-30 DIAGNOSIS — F17210 Nicotine dependence, cigarettes, uncomplicated: Secondary | ICD-10-CM | POA: Diagnosis not present

## 2018-01-30 DIAGNOSIS — F419 Anxiety disorder, unspecified: Secondary | ICD-10-CM | POA: Diagnosis not present

## 2018-01-30 DIAGNOSIS — R0600 Dyspnea, unspecified: Secondary | ICD-10-CM | POA: Diagnosis not present

## 2018-01-30 DIAGNOSIS — Z8673 Personal history of transient ischemic attack (TIA), and cerebral infarction without residual deficits: Secondary | ICD-10-CM | POA: Diagnosis not present

## 2018-01-30 DIAGNOSIS — Z79899 Other long term (current) drug therapy: Secondary | ICD-10-CM | POA: Diagnosis not present

## 2018-01-30 DIAGNOSIS — Z7982 Long term (current) use of aspirin: Secondary | ICD-10-CM | POA: Diagnosis not present

## 2018-01-30 DIAGNOSIS — E876 Hypokalemia: Secondary | ICD-10-CM | POA: Diagnosis not present

## 2018-01-30 DIAGNOSIS — I10 Essential (primary) hypertension: Secondary | ICD-10-CM | POA: Diagnosis not present

## 2018-01-30 DIAGNOSIS — R7989 Other specified abnormal findings of blood chemistry: Secondary | ICD-10-CM | POA: Diagnosis not present

## 2018-01-30 DIAGNOSIS — Z882 Allergy status to sulfonamides status: Secondary | ICD-10-CM | POA: Diagnosis not present

## 2018-01-30 DIAGNOSIS — Z7951 Long term (current) use of inhaled steroids: Secondary | ICD-10-CM | POA: Diagnosis not present

## 2018-01-31 DIAGNOSIS — J449 Chronic obstructive pulmonary disease, unspecified: Secondary | ICD-10-CM | POA: Diagnosis not present

## 2018-01-31 DIAGNOSIS — I10 Essential (primary) hypertension: Secondary | ICD-10-CM | POA: Diagnosis not present

## 2018-01-31 DIAGNOSIS — R748 Abnormal levels of other serum enzymes: Secondary | ICD-10-CM | POA: Diagnosis not present

## 2018-02-01 ENCOUNTER — Other Ambulatory Visit: Payer: Self-pay | Admitting: Internal Medicine

## 2018-02-02 NOTE — Telephone Encounter (Signed)
Can you review this and also pt had appt with you on may 7

## 2018-02-05 DIAGNOSIS — J441 Chronic obstructive pulmonary disease with (acute) exacerbation: Secondary | ICD-10-CM | POA: Diagnosis not present

## 2018-02-05 DIAGNOSIS — F329 Major depressive disorder, single episode, unspecified: Secondary | ICD-10-CM | POA: Diagnosis not present

## 2018-02-05 DIAGNOSIS — I10 Essential (primary) hypertension: Secondary | ICD-10-CM | POA: Diagnosis not present

## 2018-02-05 DIAGNOSIS — F17210 Nicotine dependence, cigarettes, uncomplicated: Secondary | ICD-10-CM | POA: Diagnosis not present

## 2018-02-05 DIAGNOSIS — F410 Panic disorder [episodic paroxysmal anxiety] without agoraphobia: Secondary | ICD-10-CM | POA: Diagnosis not present

## 2018-02-05 DIAGNOSIS — J9611 Chronic respiratory failure with hypoxia: Secondary | ICD-10-CM | POA: Diagnosis not present

## 2018-02-08 ENCOUNTER — Other Ambulatory Visit: Payer: Self-pay

## 2018-02-08 ENCOUNTER — Encounter: Payer: Self-pay | Admitting: Internal Medicine

## 2018-02-08 ENCOUNTER — Ambulatory Visit (INDEPENDENT_AMBULATORY_CARE_PROVIDER_SITE_OTHER): Payer: Medicare Other | Admitting: Internal Medicine

## 2018-02-08 VITALS — BP 126/67 | HR 70 | Resp 16 | Ht 64.0 in

## 2018-02-08 DIAGNOSIS — I10 Essential (primary) hypertension: Secondary | ICD-10-CM | POA: Diagnosis not present

## 2018-02-08 DIAGNOSIS — F411 Generalized anxiety disorder: Secondary | ICD-10-CM | POA: Diagnosis not present

## 2018-02-08 DIAGNOSIS — J441 Chronic obstructive pulmonary disease with (acute) exacerbation: Secondary | ICD-10-CM | POA: Diagnosis not present

## 2018-02-08 DIAGNOSIS — E782 Mixed hyperlipidemia: Secondary | ICD-10-CM | POA: Diagnosis not present

## 2018-02-08 DIAGNOSIS — F410 Panic disorder [episodic paroxysmal anxiety] without agoraphobia: Secondary | ICD-10-CM | POA: Diagnosis not present

## 2018-02-08 DIAGNOSIS — J449 Chronic obstructive pulmonary disease, unspecified: Secondary | ICD-10-CM

## 2018-02-08 DIAGNOSIS — R5381 Other malaise: Secondary | ICD-10-CM | POA: Diagnosis not present

## 2018-02-08 DIAGNOSIS — F329 Major depressive disorder, single episode, unspecified: Secondary | ICD-10-CM | POA: Diagnosis not present

## 2018-02-08 DIAGNOSIS — J9611 Chronic respiratory failure with hypoxia: Secondary | ICD-10-CM | POA: Diagnosis not present

## 2018-02-08 DIAGNOSIS — F17210 Nicotine dependence, cigarettes, uncomplicated: Secondary | ICD-10-CM | POA: Diagnosis not present

## 2018-02-08 MED ORDER — ALPRAZOLAM 0.25 MG PO TABS: ORAL_TABLET | ORAL | 0 refills | Status: DC

## 2018-02-08 MED ORDER — DULOXETINE HCL 30 MG PO CPEP: 30.0000 mg | ORAL_CAPSULE | Freq: Every day | ORAL | 3 refills | Status: DC

## 2018-02-08 MED ORDER — IPRATROPIUM-ALBUTEROL 0.5-2.5 (3) MG/3ML IN SOLN: 3.0000 mL | Freq: Four times a day (QID) | RESPIRATORY_TRACT | 3 refills | Status: DC

## 2018-02-08 MED ORDER — MONTELUKAST SODIUM 10 MG PO TABS: 10.0000 mg | ORAL_TABLET | Freq: Every day | ORAL | 3 refills | Status: DC

## 2018-02-08 NOTE — Progress Notes (Signed)
[facility] [address] [place], [place] [address]  Internal MEDICINE  Office Visit Note  Patient Name: Alicia Rivera  [id number]  [id number]  Date of Service: 02/08/2018   Chief Complaint  Patient presents with  . Hospitalization Follow-up   HPI Pt is here for recent hospital follow up.Pt was hospitalized for copd excerbation, She was there for 3 weeks. She has generalized muscle wasting, getting PT/OT. Daughter in law is in the room. Needs her refills  Current Medication: Outpatient Encounter Medications as of 02/08/2018  Medication Sig  . albuterol (PROVENTIL HFA;VENTOLIN HFA) 108 (90 Base) MCG/ACT inhaler Inhale into the lungs.  Marland Kitchen aspirin EC 81 MG tablet Take 81 mg by mouth daily.  . budesonide-formoterol (SYMBICORT) 160-4.5 MCG/ACT inhaler Inhale into the lungs.  Marland Kitchen losartan (COZAAR) 25 MG tablet Take 25 mg by mouth daily.  . metoprolol succinate (TOPROL-XL) 50 MG 24 hr tablet Take 50 mg by mouth daily. Take with or immediately following a meal.  . roflumilast (DALIRESP) 500 MCG TABS tablet Take 500 mcg by mouth daily.  . [DISCONTINUED] ALPRAZolam (XANAX) 0.25 MG tablet Take 1 tablet (0.25 mg total) by mouth 3 (three) times daily as needed for anxiety.  . [DISCONTINUED] ipratropium (ATROVENT) 0.02 % nebulizer solution Inhale into the lungs.  . ALPRAZolam (XANAX) 0.25 MG tablet Half to one tab po qd prn for anxiety  . budesonide (PULMICORT) 0.25 MG/2ML nebulizer solution Take 2 mLs (0.25 mg total) by nebulization 2 (two) times daily.  . cefTRIAXone 1 g in sodium chloride 0.9 % 100 mL Inject 1 g into the vein daily.  . cetirizine (ZYRTEC) 10 MG tablet Take 10 mg by mouth.  . dexmedetomidine (PRECEDEX) 400 MCG/100ML SOLN Inject 31.4-141.3 mcg/hr into the vein continuous.  . DULoxetine (CYMBALTA) 30 MG capsule Take 1 capsule (30 mg total) by mouth daily.  Marland Kitchen loratadine (CLARITIN) 10 MG tablet Take 1 tablet (10 mg total) by mouth daily.  . montelukast (SINGULAIR) 10 MG  tablet Take 1 tablet (10 mg total) by mouth at bedtime.  . [DISCONTINUED] acetaminophen (TYLENOL) 325 MG tablet Take 2 tablets (650 mg total) by mouth every 6 (six) hours as needed for mild pain (or Fever >/= 101).  . [DISCONTINUED] acetaminophen (TYLENOL) 650 MG suppository Place 1 suppository (650 mg total) rectally every 6 (six) hours as needed for mild pain (or Fever >/= 101).  . [DISCONTINUED] DULoxetine 40 MG CPEP Take 40 mg by mouth every morning. (Patient taking differently: Take 20 mg by mouth every morning. )  . [DISCONTINUED] enoxaparin (LOVENOX) 40 MG/0.4ML injection Inject 0.4 mLs (40 mg total) into the skin daily at 6 PM.  . [DISCONTINUED] ipratropium-albuterol (DUONEB) 0.5-2.5 (3) MG/3ML SOLN Take 3 mLs by nebulization every 6 (six) hours.  . [DISCONTINUED] ipratropium-albuterol (DUONEB) 0.5-2.5 (3) MG/3ML SOLN Take 3 mLs by nebulization every 6 (six) hours.  . [DISCONTINUED] montelukast (SINGULAIR) 10 MG tablet Take by mouth.  . [DISCONTINUED] nicotine (NICODERM CQ - DOSED IN MG/24 HOURS) 21 mg/24hr patch Place 1 patch (21 mg total) onto the skin daily.  . [DISCONTINUED] ondansetron (ZOFRAN) 4 MG tablet Take 1 tablet (4 mg total) by mouth every 6 (six) hours as needed for nausea.  . [DISCONTINUED] ondansetron (ZOFRAN) 4 MG/2ML SOLN injection Inject 2 mLs (4 mg total) into the vein every 6 (six) hours as needed for nausea.  . [DISCONTINUED] predniSONE (DELTASONE) 20 MG tablet Take 2 tablets (40 mg total) by mouth daily with breakfast.  . [DISCONTINUED] senna-docusate (SENOKOT-S)  8.6-50 MG tablet Take 1 tablet by mouth at bedtime as needed for mild constipation.  . [DISCONTINUED] sodium chloride 0.9 % infusion Inject 75 mLs into the vein continuous.  . [DISCONTINUED] sodium chloride flush (NS) 0.9 % SOLN Inject 3 mLs into the vein every 12 (twelve) hours.  . [DISCONTINUED] sodium chloride flush (NS) 0.9 % SOLN Inject 3 mLs into the vein as needed.   No facility-administered encounter  medications on file as of 02/08/2018.     Surgical History: Past Surgical History:  Procedure Laterality Date  . none      Medical History: Past Medical History:  Diagnosis Date  . Anxiety   . COPD (chronic obstructive pulmonary disease) (HCC)   . HTN (hypertension)   . Pneumonia 09/18/2017  . Tobacco abuse     Family History: Family History  Problem Relation Age of Onset  . Emphysema Brother   . Lung disease Mother   . Heart disease Father     Social History   Socioeconomic History  . Marital status: Single    Spouse name: Not on file  . Number of children: Not on file  . Years of education: Not on file  . Highest education level: Not on file  Occupational History  . Occupation: disabled  Social Needs  . Financial resource strain: Not on file  . Food insecurity:    Worry: Not on file    Inability: Not on file  . Transportation needs:    Medical: Not on file    Non-medical: Not on file  Tobacco Use  . Smoking status: Former Games developer  . Smokeless tobacco: Former Neurosurgeon  . Tobacco comment: quit 6 weeks ago  Substance and Sexual Activity  . Alcohol use: No  . Drug use: No  . Sexual activity: Never  Lifestyle  . Physical activity:    Days per week: Not on file    Minutes per session: Not on file  . Stress: Not on file  Relationships  . Social connections:    Talks on phone: Not on file    Gets together: Not on file    Attends religious service: Not on file    Active member of club or organization: Not on file    Attends meetings of clubs or organizations: Not on file    Relationship status: Not on file  . Intimate partner violence:    Fear of current or ex partner: Not on file    Emotionally abused: Not on file    Physically abused: Not on file    Forced sexual activity: Not on file  Other Topics Concern  . Not on file  Social History Narrative  . Not on file   Review of Systems  Constitutional: Negative for chills, diaphoresis and fatigue.  HENT:  Negative for ear pain, postnasal drip and sinus pressure.   Eyes: Negative for photophobia, discharge, redness, itching and visual disturbance.  Respiratory: Positive for shortness of breath. Negative for cough and wheezing.   Cardiovascular: Negative for chest pain, palpitations and leg swelling.  Gastrointestinal: Negative for abdominal pain, constipation, diarrhea, nausea and vomiting.  Genitourinary: Negative for dysuria and flank pain.  Musculoskeletal: Positive for arthralgias and myalgias. Negative for back pain, gait problem and neck pain.  Skin: Negative for color change.  Allergic/Immunologic: Negative for environmental allergies and food allergies.  Neurological: Positive for weakness. Negative for dizziness and headaches.  Hematological: Does not bruise/bleed easily.  Psychiatric/Behavioral: Negative for agitation, behavioral problems (depression) and hallucinations.  Vital Signs: BP 126/67   Pulse 70   Resp 16   Ht  (1.626 m)   SpO2 97%   BMI 29.70 kg/m   Physical Exam  Constitutional: She is oriented to person, place, and time. She appears well-developed and well-nourished. No distress.  HENT:  Head: Normocephalic and atraumatic.  Mouth/Throat: Oropharynx is clear and moist. No oropharyngeal exudate.  Eyes: Pupils are equal, round, and reactive to light. EOM are normal.  Neck: Normal range of motion. Neck supple. No JVD present. No tracheal deviation present. No thyromegaly present.  Cardiovascular: Normal rate, regular rhythm and normal heart sounds. Exam reveals no gallop and no friction rub.  No murmur heard. Pulmonary/Chest: Effort normal. No respiratory distress. She has no wheezes. She has no rales. She exhibits no tenderness.  Abdominal: Soft. Bowel sounds are normal.  Musculoskeletal: Normal range of motion.  Lymphadenopathy:    She has no cervical adenopathy.  Neurological: She is alert and oriented to person, place, and time. No cranial nerve deficit.   Skin: Skin is warm and dry. She is not diaphoretic.  Psychiatric: She has a normal mood and affect. Her behavior is normal. Judgment and thought content normal.  Nursing note and vitals reviewed.  Assessment/Plan: 1. Obstructive chronic bronchitis without exacerbation (HCC) - Continue - roflumilast (DALIRESP) 500 MCG TABS tablet; Take 500 mcg by mouth daily. - budesonide-formoterol (SYMBICORT) 160-4.5 MCG/ACT inhaler; Inhale into the lungs.  2. Physical deconditioning - Rehab as before   3. Essential hypertension, benign - metoprolol succinate (TOPROL-XL) 50 MG 24 hr tablet; Take 50 mg by mouth daily. Take with or immediately following a meal. - losartan (COZAAR) 25 MG tablet; Take 25 mg by mouth daily.  4. Generalized anxiety disorder - DULoxetine (CYMBALTA) 30 MG capsule; Take 1 capsule (30 mg total) by mouth daily.  Dispense: 90 capsule; Refill: 3 - ALPRAZolam (XANAX) 0.25 MG tablet; Half to one tab po qd prn for anxiety  Dispense: 20 tablet; Refill: 0  General Counseling: Jessina verbalizes understanding of the findings of todays visit and agrees with plan of treatment. I have discussed any further diagnostic evaluation that may be needed or ordered today. We also reviewed her medications today. she has been encouraged to call the office with any questions or concerns that should arise related to todays visit.   Orders Placed This Encounter  Procedures  . CBC with Differential/Platelet  . Lipid Panel With LDL/HDL Ratio  . TSH  . T4, free  . Comprehensive metabolic panel  . Urinalysis     I have reviewed all medical records from hospital follow up including radiology reports and consults from other physicians. Appropriate follow up diagnostics will be scheduled as needed. Patient/ Family understands the plan of treatment. Time spent 25 minutes.   Dr Lyndon Code, MD Internal Medicine

## 2018-02-09 ENCOUNTER — Ambulatory Visit: Payer: Self-pay | Admitting: Internal Medicine

## 2018-02-10 DIAGNOSIS — F410 Panic disorder [episodic paroxysmal anxiety] without agoraphobia: Secondary | ICD-10-CM | POA: Diagnosis not present

## 2018-02-10 DIAGNOSIS — F17210 Nicotine dependence, cigarettes, uncomplicated: Secondary | ICD-10-CM | POA: Diagnosis not present

## 2018-02-10 DIAGNOSIS — F329 Major depressive disorder, single episode, unspecified: Secondary | ICD-10-CM | POA: Diagnosis not present

## 2018-02-10 DIAGNOSIS — J441 Chronic obstructive pulmonary disease with (acute) exacerbation: Secondary | ICD-10-CM | POA: Diagnosis not present

## 2018-02-10 DIAGNOSIS — J9611 Chronic respiratory failure with hypoxia: Secondary | ICD-10-CM | POA: Diagnosis not present

## 2018-02-10 DIAGNOSIS — I10 Essential (primary) hypertension: Secondary | ICD-10-CM | POA: Diagnosis not present

## 2018-02-11 DIAGNOSIS — F17210 Nicotine dependence, cigarettes, uncomplicated: Secondary | ICD-10-CM | POA: Diagnosis not present

## 2018-02-11 DIAGNOSIS — J441 Chronic obstructive pulmonary disease with (acute) exacerbation: Secondary | ICD-10-CM | POA: Diagnosis not present

## 2018-02-11 DIAGNOSIS — F329 Major depressive disorder, single episode, unspecified: Secondary | ICD-10-CM | POA: Diagnosis not present

## 2018-02-11 DIAGNOSIS — F410 Panic disorder [episodic paroxysmal anxiety] without agoraphobia: Secondary | ICD-10-CM | POA: Diagnosis not present

## 2018-02-11 DIAGNOSIS — J9611 Chronic respiratory failure with hypoxia: Secondary | ICD-10-CM | POA: Diagnosis not present

## 2018-02-11 DIAGNOSIS — I10 Essential (primary) hypertension: Secondary | ICD-10-CM | POA: Diagnosis not present

## 2018-02-15 DIAGNOSIS — J9611 Chronic respiratory failure with hypoxia: Secondary | ICD-10-CM | POA: Diagnosis not present

## 2018-02-15 DIAGNOSIS — J441 Chronic obstructive pulmonary disease with (acute) exacerbation: Secondary | ICD-10-CM | POA: Diagnosis not present

## 2018-02-15 DIAGNOSIS — F329 Major depressive disorder, single episode, unspecified: Secondary | ICD-10-CM | POA: Diagnosis not present

## 2018-02-15 DIAGNOSIS — F410 Panic disorder [episodic paroxysmal anxiety] without agoraphobia: Secondary | ICD-10-CM | POA: Diagnosis not present

## 2018-02-15 DIAGNOSIS — I10 Essential (primary) hypertension: Secondary | ICD-10-CM | POA: Diagnosis not present

## 2018-02-15 DIAGNOSIS — F17210 Nicotine dependence, cigarettes, uncomplicated: Secondary | ICD-10-CM | POA: Diagnosis not present

## 2018-02-16 DIAGNOSIS — J9611 Chronic respiratory failure with hypoxia: Secondary | ICD-10-CM | POA: Diagnosis not present

## 2018-02-16 DIAGNOSIS — I10 Essential (primary) hypertension: Secondary | ICD-10-CM | POA: Diagnosis not present

## 2018-02-16 DIAGNOSIS — F329 Major depressive disorder, single episode, unspecified: Secondary | ICD-10-CM | POA: Diagnosis not present

## 2018-02-16 DIAGNOSIS — F17210 Nicotine dependence, cigarettes, uncomplicated: Secondary | ICD-10-CM | POA: Diagnosis not present

## 2018-02-16 DIAGNOSIS — J441 Chronic obstructive pulmonary disease with (acute) exacerbation: Secondary | ICD-10-CM | POA: Diagnosis not present

## 2018-02-16 DIAGNOSIS — F410 Panic disorder [episodic paroxysmal anxiety] without agoraphobia: Secondary | ICD-10-CM | POA: Diagnosis not present

## 2018-02-17 ENCOUNTER — Other Ambulatory Visit: Payer: Self-pay

## 2018-02-17 DIAGNOSIS — J441 Chronic obstructive pulmonary disease with (acute) exacerbation: Secondary | ICD-10-CM | POA: Diagnosis not present

## 2018-02-17 DIAGNOSIS — I10 Essential (primary) hypertension: Secondary | ICD-10-CM

## 2018-02-17 DIAGNOSIS — F329 Major depressive disorder, single episode, unspecified: Secondary | ICD-10-CM | POA: Diagnosis not present

## 2018-02-17 DIAGNOSIS — J9611 Chronic respiratory failure with hypoxia: Secondary | ICD-10-CM | POA: Diagnosis not present

## 2018-02-17 DIAGNOSIS — F17210 Nicotine dependence, cigarettes, uncomplicated: Secondary | ICD-10-CM | POA: Diagnosis not present

## 2018-02-17 DIAGNOSIS — F410 Panic disorder [episodic paroxysmal anxiety] without agoraphobia: Secondary | ICD-10-CM | POA: Diagnosis not present

## 2018-02-17 MED ORDER — METOPROLOL SUCCINATE ER 50 MG PO TB24: 50.0000 mg | ORAL_TABLET | Freq: Every day | ORAL | 5 refills | Status: DC

## 2018-02-18 ENCOUNTER — Other Ambulatory Visit: Payer: Self-pay | Admitting: Internal Medicine

## 2018-02-18 DIAGNOSIS — F329 Major depressive disorder, single episode, unspecified: Secondary | ICD-10-CM | POA: Diagnosis not present

## 2018-02-18 DIAGNOSIS — F17210 Nicotine dependence, cigarettes, uncomplicated: Secondary | ICD-10-CM | POA: Diagnosis not present

## 2018-02-18 DIAGNOSIS — J441 Chronic obstructive pulmonary disease with (acute) exacerbation: Secondary | ICD-10-CM | POA: Diagnosis not present

## 2018-02-18 DIAGNOSIS — J9611 Chronic respiratory failure with hypoxia: Secondary | ICD-10-CM | POA: Diagnosis not present

## 2018-02-18 DIAGNOSIS — I10 Essential (primary) hypertension: Secondary | ICD-10-CM | POA: Diagnosis not present

## 2018-02-18 DIAGNOSIS — F410 Panic disorder [episodic paroxysmal anxiety] without agoraphobia: Secondary | ICD-10-CM | POA: Diagnosis not present

## 2018-02-22 DIAGNOSIS — J449 Chronic obstructive pulmonary disease, unspecified: Secondary | ICD-10-CM | POA: Diagnosis not present

## 2018-02-22 DIAGNOSIS — Z87891 Personal history of nicotine dependence: Secondary | ICD-10-CM | POA: Diagnosis not present

## 2018-02-22 DIAGNOSIS — Z7982 Long term (current) use of aspirin: Secondary | ICD-10-CM | POA: Diagnosis not present

## 2018-02-22 DIAGNOSIS — I10 Essential (primary) hypertension: Secondary | ICD-10-CM | POA: Diagnosis not present

## 2018-02-22 DIAGNOSIS — Z8673 Personal history of transient ischemic attack (TIA), and cerebral infarction without residual deficits: Secondary | ICD-10-CM | POA: Diagnosis not present

## 2018-02-22 DIAGNOSIS — Z7951 Long term (current) use of inhaled steroids: Secondary | ICD-10-CM | POA: Diagnosis not present

## 2018-02-22 DIAGNOSIS — R0683 Snoring: Secondary | ICD-10-CM | POA: Diagnosis not present

## 2018-02-22 DIAGNOSIS — R531 Weakness: Secondary | ICD-10-CM | POA: Diagnosis not present

## 2018-02-22 DIAGNOSIS — J441 Chronic obstructive pulmonary disease with (acute) exacerbation: Secondary | ICD-10-CM | POA: Diagnosis not present

## 2018-02-22 DIAGNOSIS — R911 Solitary pulmonary nodule: Secondary | ICD-10-CM | POA: Diagnosis not present

## 2018-02-22 DIAGNOSIS — Z882 Allergy status to sulfonamides status: Secondary | ICD-10-CM | POA: Diagnosis not present

## 2018-02-23 DIAGNOSIS — J9611 Chronic respiratory failure with hypoxia: Secondary | ICD-10-CM | POA: Diagnosis not present

## 2018-02-23 DIAGNOSIS — J441 Chronic obstructive pulmonary disease with (acute) exacerbation: Secondary | ICD-10-CM | POA: Diagnosis not present

## 2018-02-23 DIAGNOSIS — I10 Essential (primary) hypertension: Secondary | ICD-10-CM | POA: Diagnosis not present

## 2018-02-23 DIAGNOSIS — F17210 Nicotine dependence, cigarettes, uncomplicated: Secondary | ICD-10-CM | POA: Diagnosis not present

## 2018-02-23 DIAGNOSIS — F410 Panic disorder [episodic paroxysmal anxiety] without agoraphobia: Secondary | ICD-10-CM | POA: Diagnosis not present

## 2018-02-23 DIAGNOSIS — F329 Major depressive disorder, single episode, unspecified: Secondary | ICD-10-CM | POA: Diagnosis not present

## 2018-02-24 DIAGNOSIS — J9611 Chronic respiratory failure with hypoxia: Secondary | ICD-10-CM | POA: Diagnosis not present

## 2018-02-24 DIAGNOSIS — F410 Panic disorder [episodic paroxysmal anxiety] without agoraphobia: Secondary | ICD-10-CM | POA: Diagnosis not present

## 2018-02-24 DIAGNOSIS — J441 Chronic obstructive pulmonary disease with (acute) exacerbation: Secondary | ICD-10-CM | POA: Diagnosis not present

## 2018-02-24 DIAGNOSIS — I10 Essential (primary) hypertension: Secondary | ICD-10-CM | POA: Diagnosis not present

## 2018-02-24 DIAGNOSIS — F329 Major depressive disorder, single episode, unspecified: Secondary | ICD-10-CM | POA: Diagnosis not present

## 2018-02-24 DIAGNOSIS — F17210 Nicotine dependence, cigarettes, uncomplicated: Secondary | ICD-10-CM | POA: Diagnosis not present

## 2018-02-26 ENCOUNTER — Other Ambulatory Visit: Payer: Self-pay | Admitting: Internal Medicine

## 2018-02-26 ENCOUNTER — Other Ambulatory Visit: Payer: Self-pay

## 2018-02-26 DIAGNOSIS — J9611 Chronic respiratory failure with hypoxia: Secondary | ICD-10-CM | POA: Diagnosis not present

## 2018-02-26 DIAGNOSIS — F410 Panic disorder [episodic paroxysmal anxiety] without agoraphobia: Secondary | ICD-10-CM | POA: Diagnosis not present

## 2018-02-26 DIAGNOSIS — F329 Major depressive disorder, single episode, unspecified: Secondary | ICD-10-CM | POA: Diagnosis not present

## 2018-02-26 DIAGNOSIS — I10 Essential (primary) hypertension: Secondary | ICD-10-CM | POA: Diagnosis not present

## 2018-02-26 DIAGNOSIS — J441 Chronic obstructive pulmonary disease with (acute) exacerbation: Secondary | ICD-10-CM | POA: Diagnosis not present

## 2018-02-26 DIAGNOSIS — F17210 Nicotine dependence, cigarettes, uncomplicated: Secondary | ICD-10-CM | POA: Diagnosis not present

## 2018-02-26 MED ORDER — IPRATROPIUM-ALBUTEROL 0.5-2.5 (3) MG/3ML IN SOLN: 3.0000 mL | Freq: Four times a day (QID) | RESPIRATORY_TRACT | 5 refills | Status: DC | PRN

## 2018-03-02 DIAGNOSIS — F410 Panic disorder [episodic paroxysmal anxiety] without agoraphobia: Secondary | ICD-10-CM | POA: Diagnosis not present

## 2018-03-02 DIAGNOSIS — I10 Essential (primary) hypertension: Secondary | ICD-10-CM | POA: Diagnosis not present

## 2018-03-02 DIAGNOSIS — J441 Chronic obstructive pulmonary disease with (acute) exacerbation: Secondary | ICD-10-CM | POA: Diagnosis not present

## 2018-03-02 DIAGNOSIS — J9611 Chronic respiratory failure with hypoxia: Secondary | ICD-10-CM | POA: Diagnosis not present

## 2018-03-02 DIAGNOSIS — F17210 Nicotine dependence, cigarettes, uncomplicated: Secondary | ICD-10-CM | POA: Diagnosis not present

## 2018-03-02 DIAGNOSIS — F329 Major depressive disorder, single episode, unspecified: Secondary | ICD-10-CM | POA: Diagnosis not present

## 2018-03-03 DIAGNOSIS — I10 Essential (primary) hypertension: Secondary | ICD-10-CM | POA: Diagnosis not present

## 2018-03-03 DIAGNOSIS — F410 Panic disorder [episodic paroxysmal anxiety] without agoraphobia: Secondary | ICD-10-CM | POA: Diagnosis not present

## 2018-03-03 DIAGNOSIS — F17210 Nicotine dependence, cigarettes, uncomplicated: Secondary | ICD-10-CM | POA: Diagnosis not present

## 2018-03-03 DIAGNOSIS — F329 Major depressive disorder, single episode, unspecified: Secondary | ICD-10-CM | POA: Diagnosis not present

## 2018-03-03 DIAGNOSIS — J441 Chronic obstructive pulmonary disease with (acute) exacerbation: Secondary | ICD-10-CM | POA: Diagnosis not present

## 2018-03-03 DIAGNOSIS — J9611 Chronic respiratory failure with hypoxia: Secondary | ICD-10-CM | POA: Diagnosis not present

## 2018-03-04 IMAGING — CT CT HEAD W/O CM
3 series · 16 of 47 positions shown, 19 images · non-contrast
Comparison: CT head 09/15/2006

CLINICAL DATA: Acute encephalopathy

EXAM:
CT HEAD WITHOUT CONTRAST
TECHNIQUE: Contiguous axial images were obtained from the base of the skull
through the vertex without intravenous contrast.

[Series 2: head wo · axial · 0.41mm/px · z∈[+37,+162]mm · 10 of 30 slices shown, 13 images]
[im 3/30  brain]
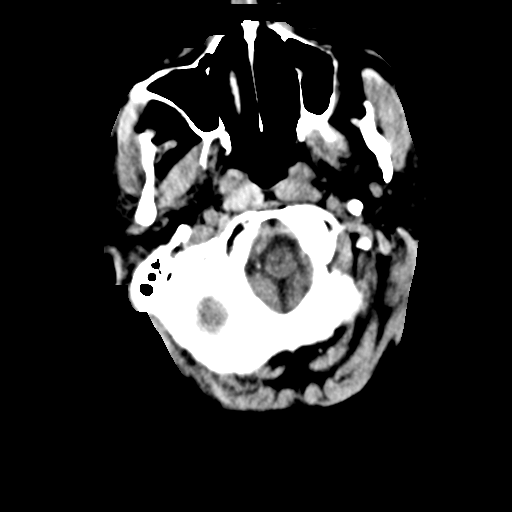
[im 3/30  bone]
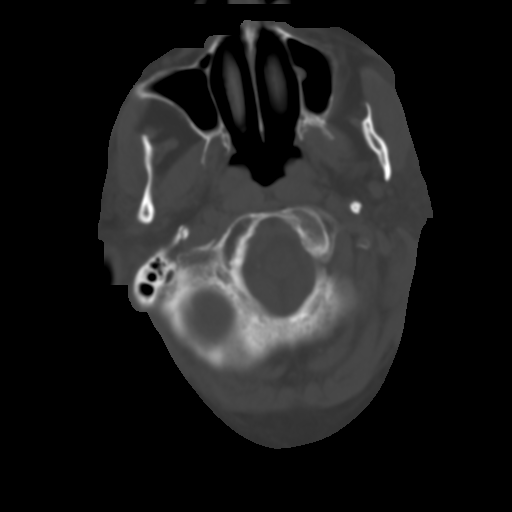
[im 6/30  brain]
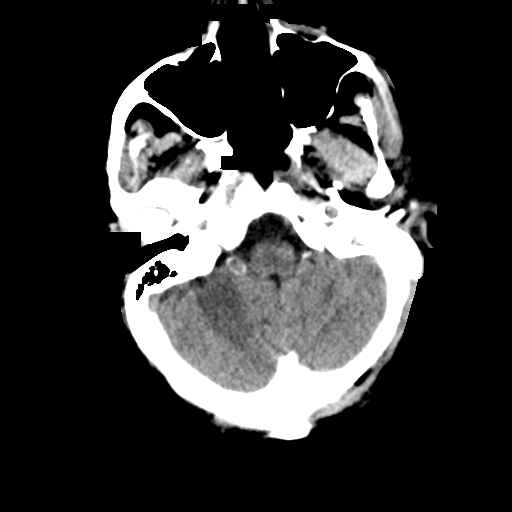
[im 9/30  brain]
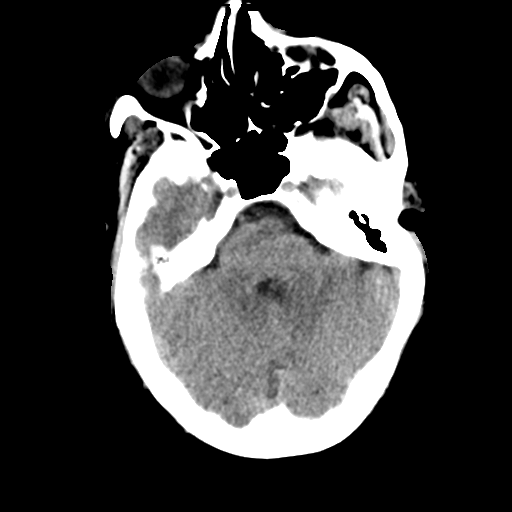
[im 11/30  brain]
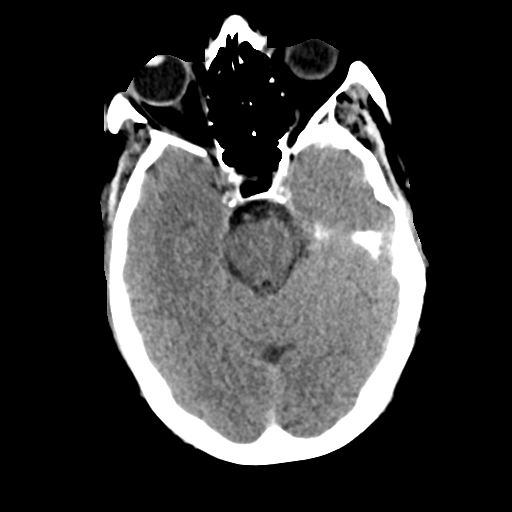
[im 14/30  brain]
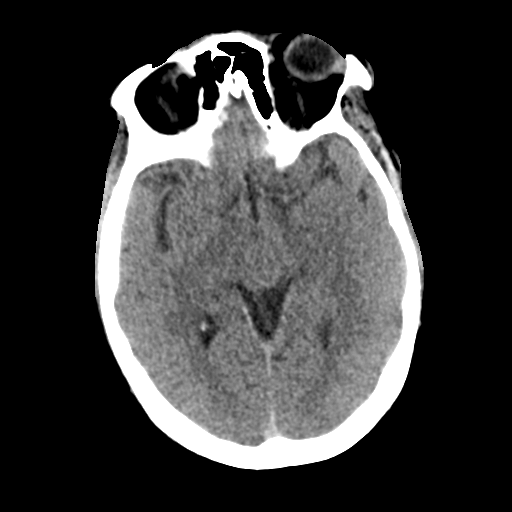
[im 14/30  bone]
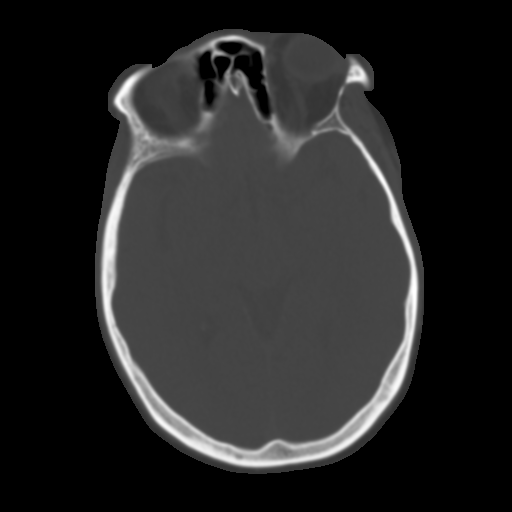
[im 17/30  brain]
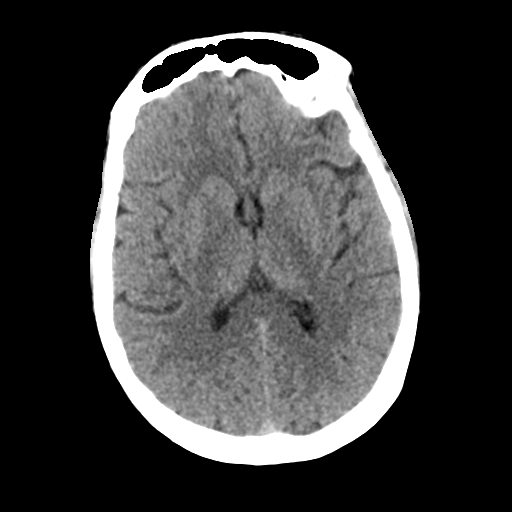
[im 20/30  brain]
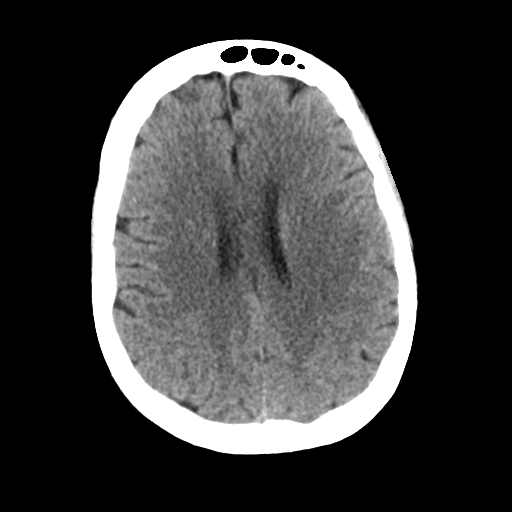
[im 23/30  brain]
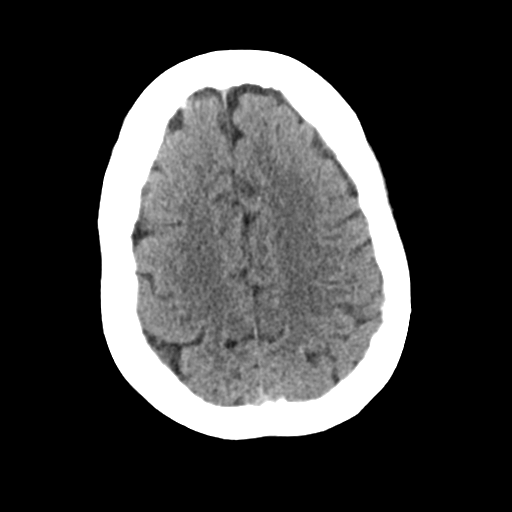
[im 25/30  brain]
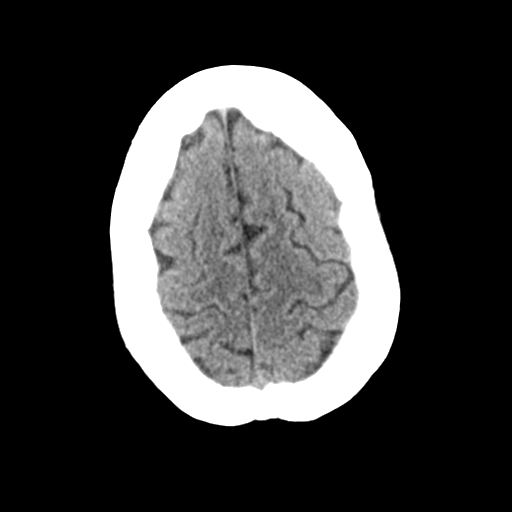
[im 25/30  bone]
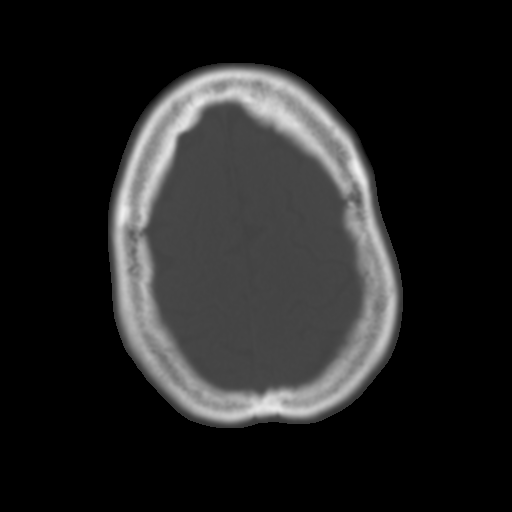
[im 28/30  brain]
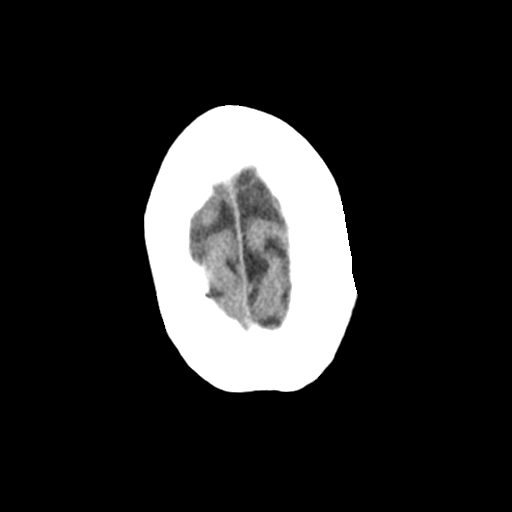

[Series 4: coronal soft tissue · coronal · 0.33mm/px · 3 of 67 slices shown]
[im 23/67  brain]
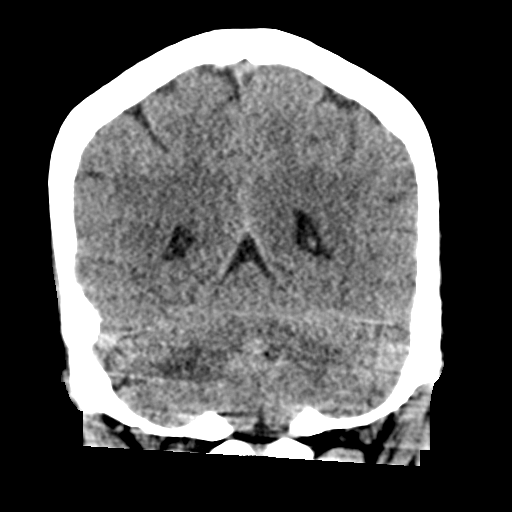
[im 30/67  brain]
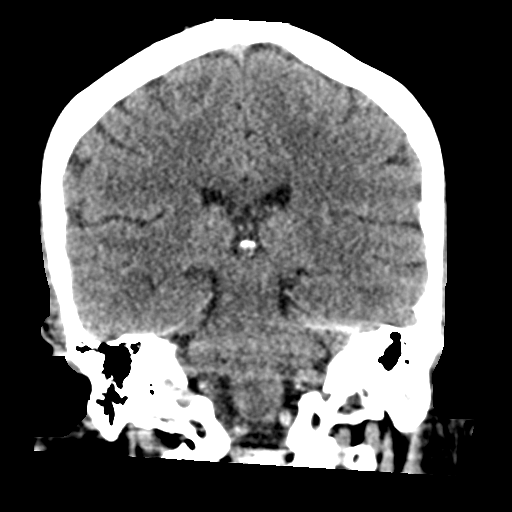
[im 37/67  brain]
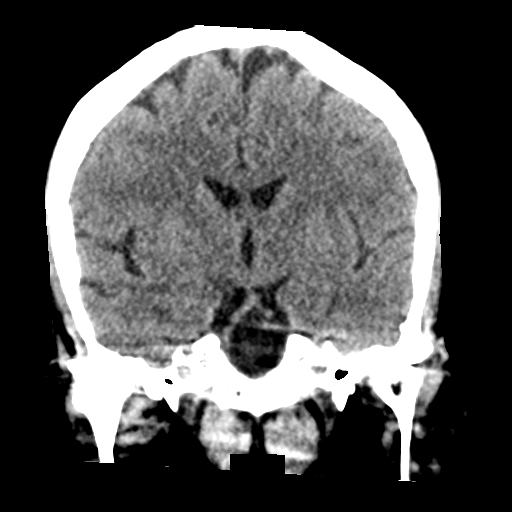

[Series 5: sagittal soft tissue · sagittal · 0.35mm/px · 3 of 53 slices shown]
[im 18/53  brain]
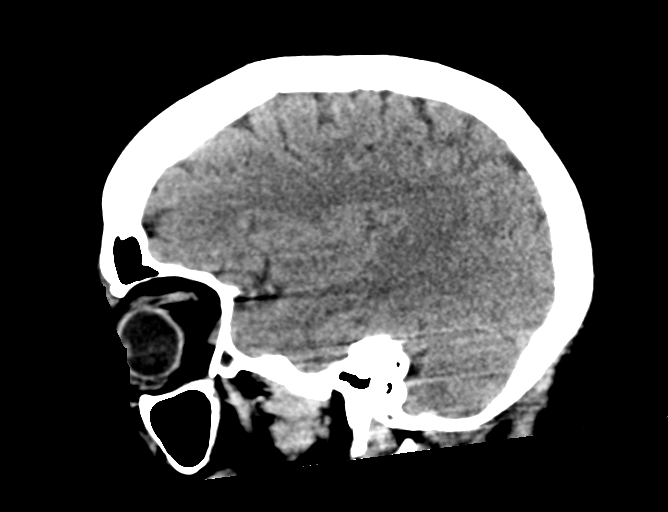
[im 27/53  brain]
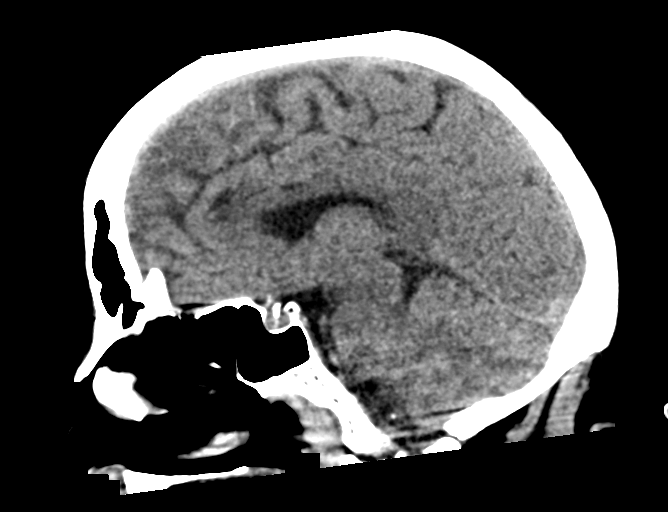
[im 35/53  brain]
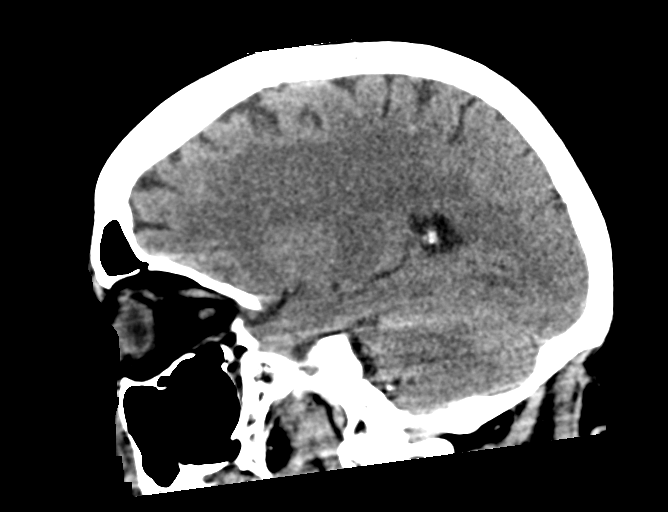

[16 of 47 positions shown; findings below may reference images not displayed]

FINDINGS: Brain: No evidence of acute infarction, hemorrhage, hydrocephalus,
extra-axial collection or mass lesion/mass effect.

Vascular: Negative for hyperdense vessel

Skull: Negative

Sinuses/Orbits: Negative

Other: None
IMPRESSION: Negative CT head

## 2018-03-04 IMAGING — MR MR HEAD W/O CM
10 series · 41 of 48 positions shown · non-contrast
Comparison: CT head 04/20/2017.

CLINICAL DATA: Shortness of breath for 1 week. Respiratory
distress. Hypoxemia. Anxiety. Altered mental status.

EXAM:
MRI HEAD WITHOUT CONTRAST
TECHNIQUE: Multiplanar, multiecho pulse sequences of the brain and surrounding
structures were obtained without intravenous contrast.

[Series 3: T1 · sagittal · 5.0mm · 0.45mm/px · 3 of 29 slices shown (1 of 2)]
[im 1/29]
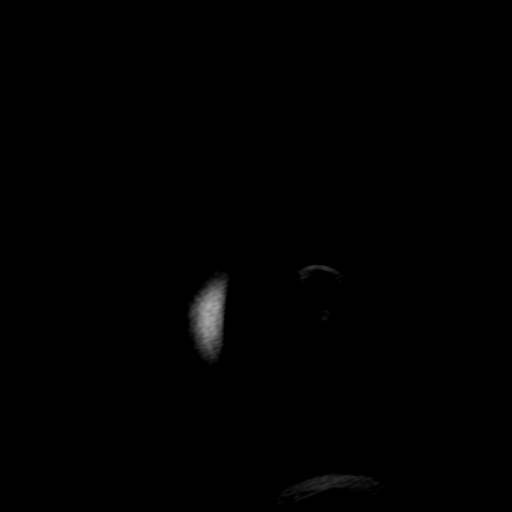
[im 15/29]
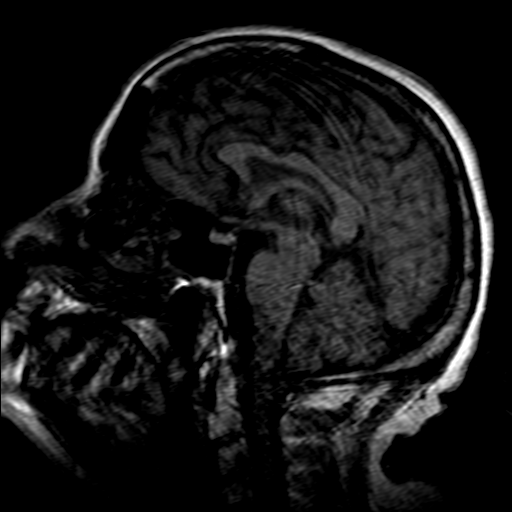
[im 29/29]
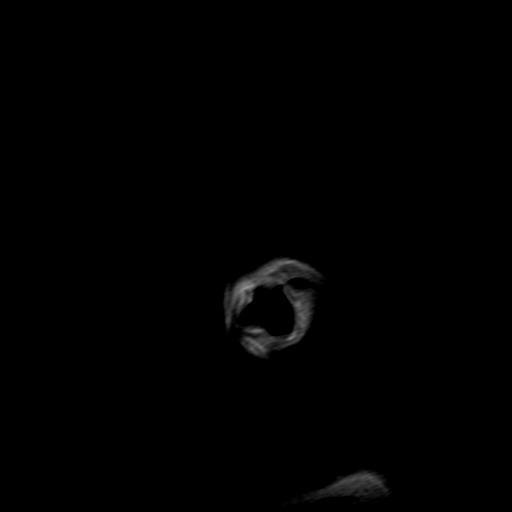

[Series 5: DWI · axial · 4.0mm · 0.94mm/px · z∈[-21,+124]mm · 5 of 45 slices shown (1 of 4)]
[im 1/45]
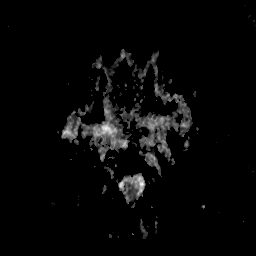
[im 12/45]
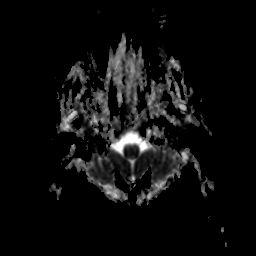
[im 23/45]
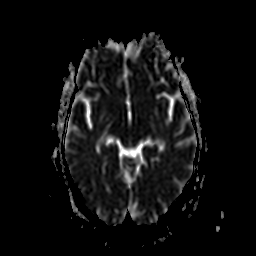
[im 34/45]
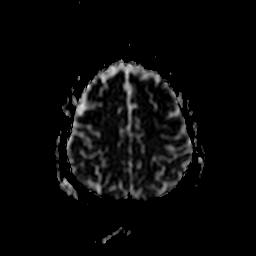
[im 45/45]
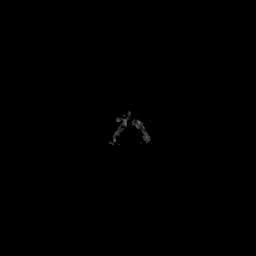

[Series 6: DWI · axial · 4.0mm · 0.94mm/px · z∈[-21,+121]mm · 6 of 42 slices shown (2 of 4)]
[im 1/42]
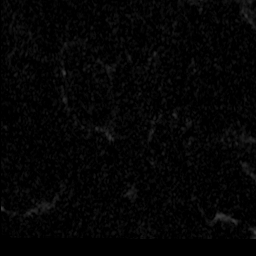
[im 9/42]
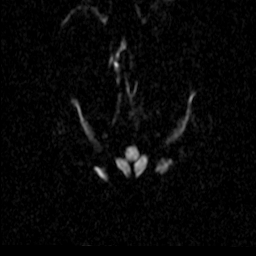
[im 17/42]
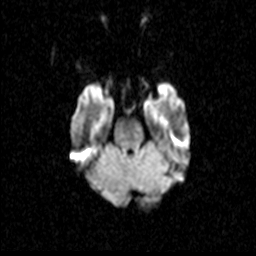
[im 25/42]
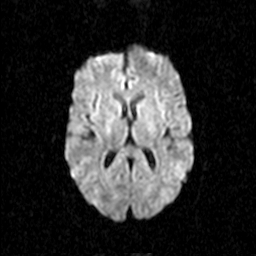
[im 33/42]
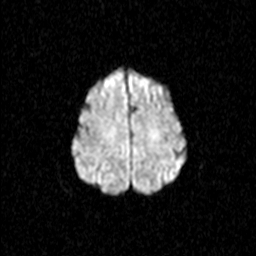
[im 42/42]
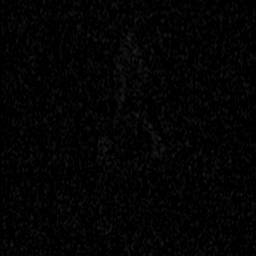

[Series 8: DWI · coronal · 5.0mm · 1.80mm/px · 5 of 39 slices shown (3 of 4)]
[im 1/39]
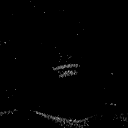
[im 10/39]
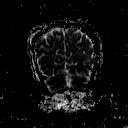
[im 20/39]
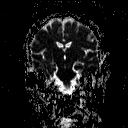
[im 29/39]
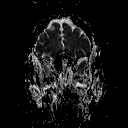
[im 39/39]
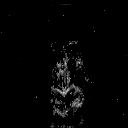

[Series 9: DWI · coronal · 5.0mm · 1.80mm/px · 5 of 37 slices shown (4 of 4)]
[im 1/37]
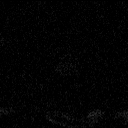
[im 10/37]
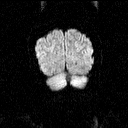
[im 19/37]
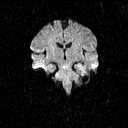
[im 28/37]
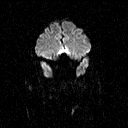
[im 37/37]
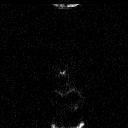

[Series 10: T2 · axial · 5.0mm · 0.45mm/px · z∈[-18,+121]mm · 4 of 27 slices shown (1 of 3)]
[im 1/27]
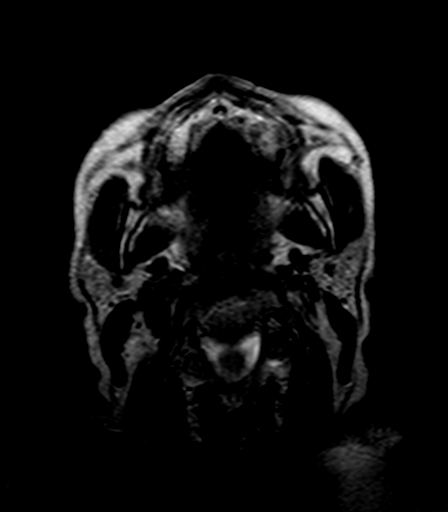
[im 9/27]
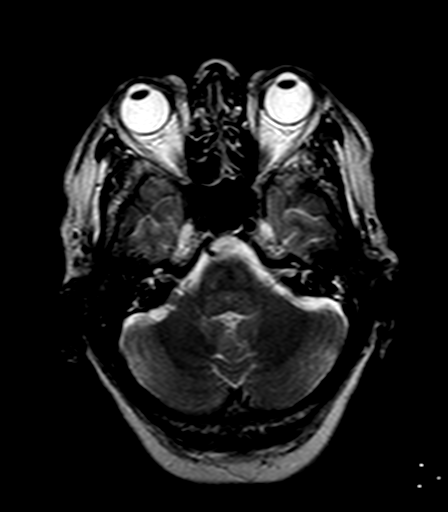
[im 18/27]
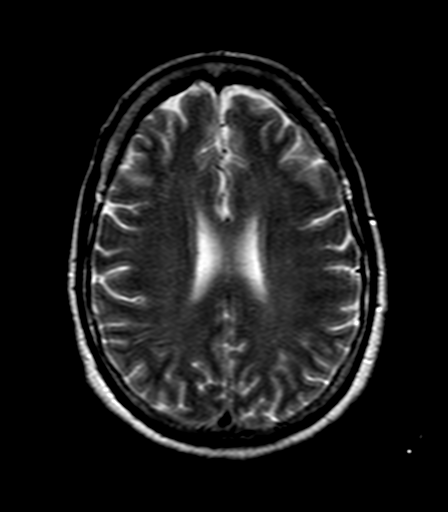
[im 27/27]
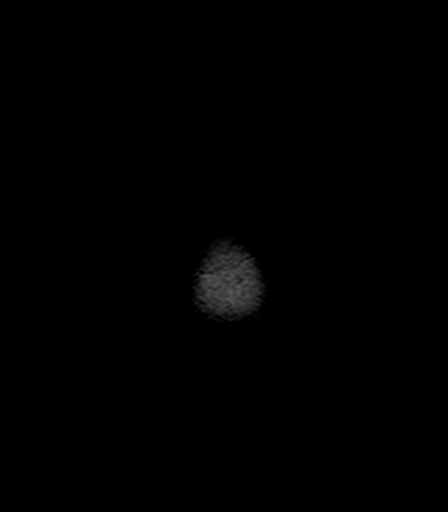

[Series 11: FLAIR · axial · 5.0mm · 0.90mm/px · z∈[-17,+122]mm · 4 of 27 slices shown]
[im 1/27]
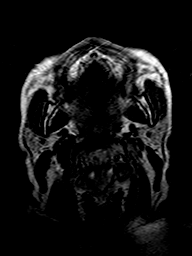
[im 9/27]
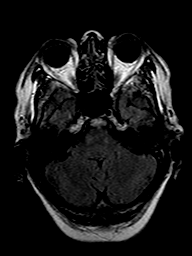
[im 18/27]
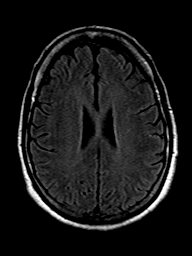
[im 27/27]
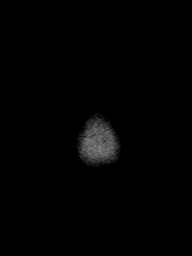

[Series 12: T2 · axial · 5.0mm · 0.45mm/px · z∈[-18,+121]mm · 4 of 27 slices shown (2 of 3)]
[im 1/27]
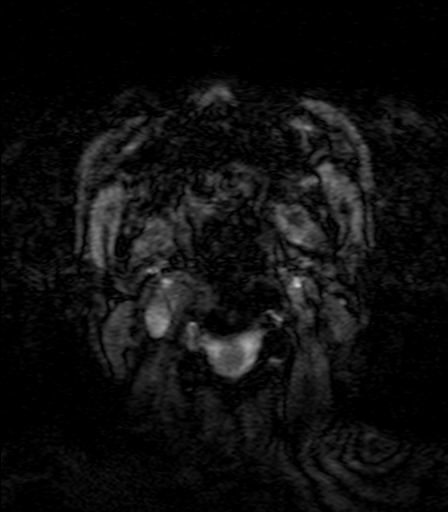
[im 9/27]
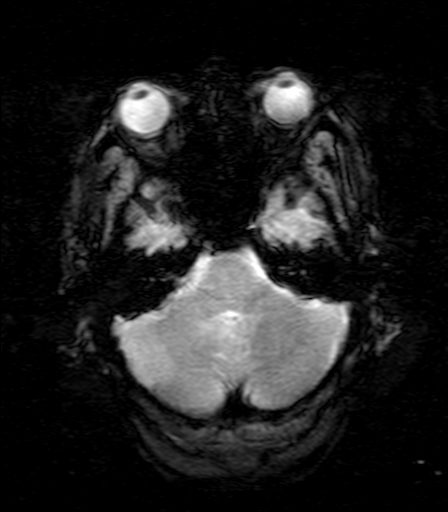
[im 18/27]
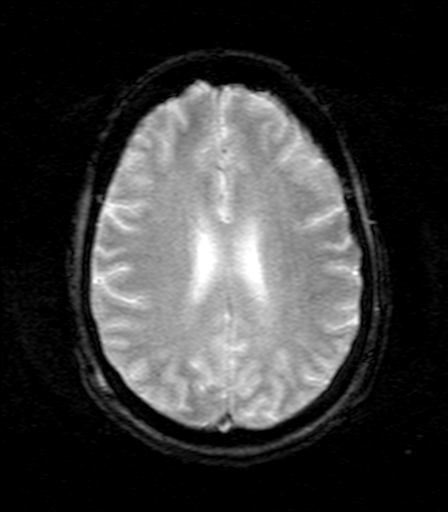
[im 27/27]
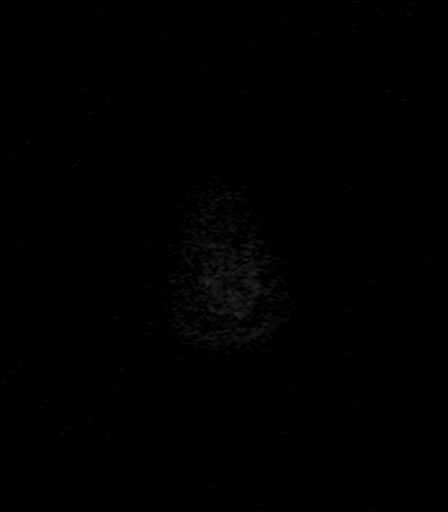

[Series 13: T1 · axial · 3.0mm · 0.45mm/px · 1 of 60 slices shown (2 of 2)]
[im 1/60]
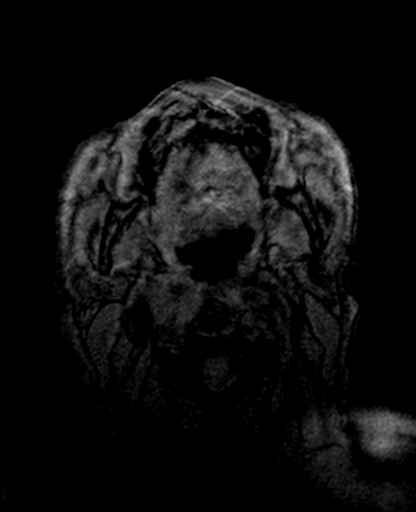

[Series 14: T2 · coronal · 5.0mm · 0.45mm/px · 4 of 29 slices shown (3 of 3)]
[im 1/29]
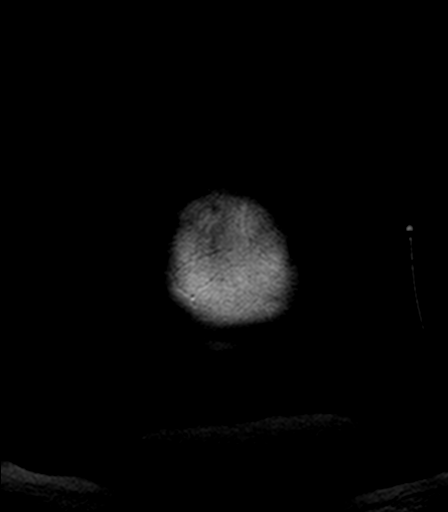
[im 10/29]
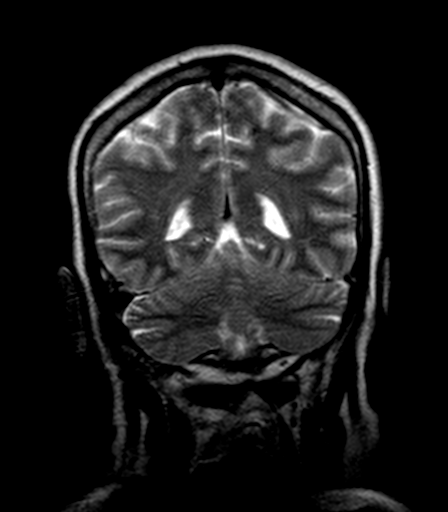
[im 19/29]
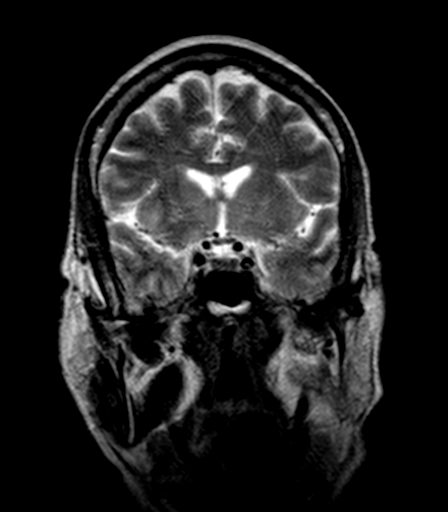
[im 29/29]
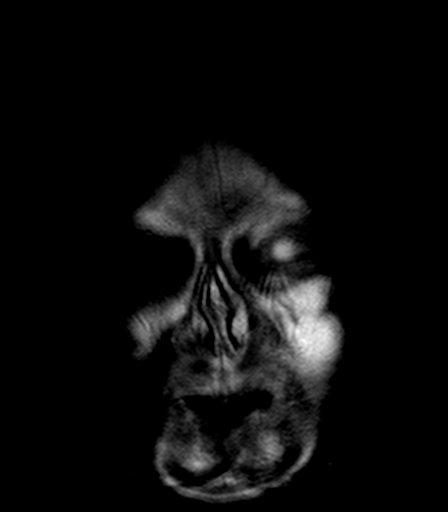

[41 of 48 positions shown; findings below may reference images not displayed]

FINDINGS: The patient was unable to remain motionless for the exam. Small or
subtle lesions could be overlooked.

Brain: No acute infarction, hemorrhage, hydrocephalus, extra-axial
collection or mass lesion. Normal for age cerebral volume. No
significant white matter disease.

Vascular: Flow voids are maintained throughout the carotid, basilar,
and vertebral arteries. There are no areas of chronic hemorrhage.

Skull and upper cervical spine: Normal marrow signal. Suspected
cervical spondylosis, possible spinal stenosis at C3-C4. No definite
tonsillar herniation.

Sinuses/Orbits: Negative.

Other: None.

Compared with earlier CT, good general agreement.
IMPRESSION: Motion degraded exam demonstrating no definite acute intracranial
abnormality.

## 2018-03-11 DIAGNOSIS — I10 Essential (primary) hypertension: Secondary | ICD-10-CM | POA: Diagnosis not present

## 2018-03-11 DIAGNOSIS — F17210 Nicotine dependence, cigarettes, uncomplicated: Secondary | ICD-10-CM | POA: Diagnosis not present

## 2018-03-11 DIAGNOSIS — Z8673 Personal history of transient ischemic attack (TIA), and cerebral infarction without residual deficits: Secondary | ICD-10-CM | POA: Diagnosis not present

## 2018-03-11 DIAGNOSIS — F410 Panic disorder [episodic paroxysmal anxiety] without agoraphobia: Secondary | ICD-10-CM | POA: Diagnosis not present

## 2018-03-11 DIAGNOSIS — J9611 Chronic respiratory failure with hypoxia: Secondary | ICD-10-CM | POA: Diagnosis not present

## 2018-03-11 DIAGNOSIS — F329 Major depressive disorder, single episode, unspecified: Secondary | ICD-10-CM | POA: Diagnosis not present

## 2018-03-11 DIAGNOSIS — Z9181 History of falling: Secondary | ICD-10-CM | POA: Diagnosis not present

## 2018-03-11 DIAGNOSIS — J441 Chronic obstructive pulmonary disease with (acute) exacerbation: Secondary | ICD-10-CM | POA: Diagnosis not present

## 2018-03-18 ENCOUNTER — Ambulatory Visit: Payer: Self-pay | Admitting: Internal Medicine

## 2018-03-24 ENCOUNTER — Other Ambulatory Visit: Payer: Self-pay | Admitting: Internal Medicine

## 2018-04-02 ENCOUNTER — Other Ambulatory Visit: Payer: Self-pay | Admitting: Nurse Practitioner

## 2018-04-02 MED ORDER — MONTELUKAST SODIUM 10 MG PO TABS: 10.0000 mg | ORAL_TABLET | Freq: Every day | ORAL | 3 refills | Status: DC

## 2018-04-09 ENCOUNTER — Other Ambulatory Visit: Payer: Self-pay

## 2018-04-09 DIAGNOSIS — J449 Chronic obstructive pulmonary disease, unspecified: Secondary | ICD-10-CM

## 2018-04-09 MED ORDER — ROFLUMILAST 500 MCG PO TABS: 500.0000 ug | ORAL_TABLET | Freq: Every day | ORAL | 3 refills | Status: DC

## 2018-04-13 ENCOUNTER — Telehealth: Payer: Self-pay | Admitting: Nurse Practitioner

## 2018-04-13 NOTE — Telephone Encounter (Signed)
Prior auth Daliresp 500mcg tab been approved 03/14/2018-04/12/2021 Pharmacy contacted

## 2018-04-16 ENCOUNTER — Telehealth: Payer: Self-pay | Admitting: Nurse Practitioner

## 2018-04-16 NOTE — Telephone Encounter (Signed)
Prior auth for alprazolam 0.25 has been approved from 03/17/2018-04/16/2019

## 2018-05-06 ENCOUNTER — Other Ambulatory Visit: Payer: Self-pay | Admitting: Internal Medicine

## 2018-06-04 DIAGNOSIS — J181 Lobar pneumonia, unspecified organism: Secondary | ICD-10-CM | POA: Diagnosis not present

## 2018-06-04 DIAGNOSIS — R7989 Other specified abnormal findings of blood chemistry: Secondary | ICD-10-CM | POA: Diagnosis not present

## 2018-06-04 DIAGNOSIS — R918 Other nonspecific abnormal finding of lung field: Secondary | ICD-10-CM | POA: Diagnosis not present

## 2018-06-04 DIAGNOSIS — J441 Chronic obstructive pulmonary disease with (acute) exacerbation: Secondary | ICD-10-CM | POA: Diagnosis not present

## 2018-06-04 DIAGNOSIS — J189 Pneumonia, unspecified organism: Secondary | ICD-10-CM | POA: Insufficient documentation

## 2018-06-04 DIAGNOSIS — I10 Essential (primary) hypertension: Secondary | ICD-10-CM | POA: Diagnosis not present

## 2018-06-04 DIAGNOSIS — M8580 Other specified disorders of bone density and structure, unspecified site: Secondary | ICD-10-CM | POA: Diagnosis not present

## 2018-06-04 DIAGNOSIS — I951 Orthostatic hypotension: Secondary | ICD-10-CM | POA: Diagnosis not present

## 2018-06-04 DIAGNOSIS — J439 Emphysema, unspecified: Secondary | ICD-10-CM | POA: Diagnosis not present

## 2018-06-04 DIAGNOSIS — Z87891 Personal history of nicotine dependence: Secondary | ICD-10-CM | POA: Diagnosis not present

## 2018-06-04 DIAGNOSIS — I2510 Atherosclerotic heart disease of native coronary artery without angina pectoris: Secondary | ICD-10-CM | POA: Diagnosis not present

## 2018-06-04 DIAGNOSIS — J440 Chronic obstructive pulmonary disease with acute lower respiratory infection: Secondary | ICD-10-CM | POA: Diagnosis not present

## 2018-06-04 DIAGNOSIS — Z7982 Long term (current) use of aspirin: Secondary | ICD-10-CM | POA: Diagnosis not present

## 2018-06-04 DIAGNOSIS — E871 Hypo-osmolality and hyponatremia: Secondary | ICD-10-CM | POA: Diagnosis not present

## 2018-06-04 DIAGNOSIS — Z7951 Long term (current) use of inhaled steroids: Secondary | ICD-10-CM | POA: Diagnosis not present

## 2018-06-04 DIAGNOSIS — D72829 Elevated white blood cell count, unspecified: Secondary | ICD-10-CM | POA: Diagnosis not present

## 2018-06-04 DIAGNOSIS — R0789 Other chest pain: Secondary | ICD-10-CM | POA: Diagnosis not present

## 2018-06-04 DIAGNOSIS — R911 Solitary pulmonary nodule: Secondary | ICD-10-CM | POA: Diagnosis not present

## 2018-06-04 DIAGNOSIS — Z8673 Personal history of transient ischemic attack (TIA), and cerebral infarction without residual deficits: Secondary | ICD-10-CM | POA: Diagnosis not present

## 2018-06-04 DIAGNOSIS — J449 Chronic obstructive pulmonary disease, unspecified: Secondary | ICD-10-CM | POA: Diagnosis not present

## 2018-06-04 DIAGNOSIS — N179 Acute kidney failure, unspecified: Secondary | ICD-10-CM | POA: Diagnosis not present

## 2018-06-04 DIAGNOSIS — F419 Anxiety disorder, unspecified: Secondary | ICD-10-CM | POA: Diagnosis not present

## 2018-06-04 DIAGNOSIS — F17210 Nicotine dependence, cigarettes, uncomplicated: Secondary | ICD-10-CM | POA: Diagnosis not present

## 2018-06-04 DIAGNOSIS — R079 Chest pain, unspecified: Secondary | ICD-10-CM | POA: Diagnosis not present

## 2018-06-05 DIAGNOSIS — I10 Essential (primary) hypertension: Secondary | ICD-10-CM | POA: Diagnosis not present

## 2018-06-05 DIAGNOSIS — J189 Pneumonia, unspecified organism: Secondary | ICD-10-CM | POA: Diagnosis not present

## 2018-06-05 DIAGNOSIS — R079 Chest pain, unspecified: Secondary | ICD-10-CM | POA: Diagnosis not present

## 2018-06-05 DIAGNOSIS — J449 Chronic obstructive pulmonary disease, unspecified: Secondary | ICD-10-CM | POA: Diagnosis not present

## 2018-06-11 DIAGNOSIS — R29898 Other symptoms and signs involving the musculoskeletal system: Secondary | ICD-10-CM | POA: Diagnosis not present

## 2018-06-11 DIAGNOSIS — R531 Weakness: Secondary | ICD-10-CM | POA: Diagnosis not present

## 2018-06-13 ENCOUNTER — Other Ambulatory Visit: Payer: Self-pay | Admitting: Internal Medicine

## 2018-06-16 ENCOUNTER — Ambulatory Visit (INDEPENDENT_AMBULATORY_CARE_PROVIDER_SITE_OTHER): Payer: Medicare Other | Admitting: Adult Health

## 2018-06-16 ENCOUNTER — Encounter: Payer: Self-pay | Admitting: Adult Health

## 2018-06-16 ENCOUNTER — Other Ambulatory Visit: Payer: Self-pay

## 2018-06-16 VITALS — BP 135/70 | HR 72 | Resp 16 | Ht 65.0 in | Wt 177.0 lb

## 2018-06-16 DIAGNOSIS — F411 Generalized anxiety disorder: Secondary | ICD-10-CM

## 2018-06-16 DIAGNOSIS — I10 Essential (primary) hypertension: Secondary | ICD-10-CM | POA: Diagnosis not present

## 2018-06-16 DIAGNOSIS — B370 Candidal stomatitis: Secondary | ICD-10-CM

## 2018-06-16 DIAGNOSIS — J449 Chronic obstructive pulmonary disease, unspecified: Secondary | ICD-10-CM

## 2018-06-16 DIAGNOSIS — Z0001 Encounter for general adult medical examination with abnormal findings: Secondary | ICD-10-CM

## 2018-06-16 DIAGNOSIS — F17218 Nicotine dependence, cigarettes, with other nicotine-induced disorders: Secondary | ICD-10-CM

## 2018-06-16 DIAGNOSIS — Z889 Allergy status to unspecified drugs, medicaments and biological substances status: Secondary | ICD-10-CM | POA: Diagnosis not present

## 2018-06-16 DIAGNOSIS — Z23 Encounter for immunization: Secondary | ICD-10-CM

## 2018-06-16 MED ORDER — ALPRAZOLAM 0.25 MG PO TABS: ORAL_TABLET | ORAL | 0 refills | Status: DC

## 2018-06-16 MED ORDER — CETIRIZINE HCL 10 MG PO TABS: 10.0000 mg | ORAL_TABLET | Freq: Every day | ORAL | 11 refills | Status: DC

## 2018-06-16 MED ORDER — LOSARTAN POTASSIUM 25 MG PO TABS: 25.0000 mg | ORAL_TABLET | Freq: Every day | ORAL | 3 refills | Status: DC

## 2018-06-16 MED ORDER — ALBUTEROL SULFATE HFA 108 (90 BASE) MCG/ACT IN AERS: 2.0000 | INHALATION_SPRAY | RESPIRATORY_TRACT | 0 refills | Status: DC | PRN

## 2018-06-16 MED ORDER — MAGIC MOUTHWASH: 5.0000 mL | Freq: Three times a day (TID) | ORAL | 0 refills | Status: DC

## 2018-06-16 MED ORDER — LORATADINE 10 MG PO TABS: 10.0000 mg | ORAL_TABLET | Freq: Every day | ORAL | Status: DC

## 2018-06-16 NOTE — Progress Notes (Signed)
[facility] [address] [place], [place] [address]  Internal MEDICINE  Office Visit Note  Patient Name: Alicia Rivera  [id number]  [id number]  Date of Service: 06/16/2018  Chief Complaint  Patient presents with  . Annual Exam  . Hypertension  . COPD     HPI Pt is here for routine health maintenance examination. She has a history of HTN, COPD,  Pt generally doing well.  She reports she is using her inhalers and nebulizers as directed. She is taking her other medications as directed.  She continues to smoke intermittently.  She reports she smoked 1 cigerette last week, and sometimes goes a few weeks without one.  She reports when her stress and anxiety are under control, she does smoke.  She denies alcohol or illicit drug use. She was admitted to the hospital for 24 hour observation due to chest pain with inspiration.  She was subsequently diagnosed with pneumonia and sent home on Levaquin.  She reports finishing the antibiotics and denies complaints at this time.     Current Medication: Outpatient Encounter Medications as of 06/16/2018  Medication Sig  . ALPRAZolam (XANAX) 0.25 MG tablet Half to one tab po qd prn for anxiety  . aspirin EC 81 MG tablet Take 81 mg by mouth daily.  . budesonide (PULMICORT) 0.25 MG/2ML nebulizer solution Take 2 mLs (0.25 mg total) by nebulization 2 (two) times daily.  . budesonide-formoterol (SYMBICORT) 160-4.5 MCG/ACT inhaler Inhale into the lungs.  . cetirizine (ZYRTEC) 10 MG tablet Take 10 mg by mouth.  . DULoxetine (CYMBALTA) 30 MG capsule Take 1 capsule (30 mg total) by mouth daily.  Marland Kitchen ipratropium-albuterol (DUONEB) 0.5-2.5 (3) MG/3ML SOLN USE 3 ML VIA NEBULIZER EVERY 6 HOURS AS NEEDED  . loratadine (CLARITIN) 10 MG tablet Take 1 tablet (10 mg total) by mouth daily.  Marland Kitchen losartan (COZAAR) 25 MG tablet Take 25 mg by mouth daily.  . metoprolol succinate (TOPROL-XL) 50 MG 24 hr tablet Take 1 tablet (50 mg total) by mouth daily. Take with or  immediately following a meal.  . montelukast (SINGULAIR) 10 MG tablet Take 1 tablet (10 mg total) by mouth at bedtime.  Marland Kitchen PROAIR HFA 108 (90 Base) MCG/ACT inhaler INHALE 2 PUFFS BY MOUTH EVERY 4 HOURS  . roflumilast (DALIRESP) 500 MCG TABS tablet Take 1 tablet (500 mcg total) by mouth daily.  . SYMBICORT 160-4.5 MCG/ACT inhaler INHALE 1 PUFF BY MOUTH TWICE DAILY  . cefTRIAXone 1 g in sodium chloride 0.9 % 100 mL Inject 1 g into the vein daily. (Patient not taking: Reported on 06/16/2018)  . dexmedetomidine (PRECEDEX) 400 MCG/100ML SOLN Inject 31.4-141.3 mcg/hr into the vein continuous. (Patient not taking: Reported on 06/16/2018)  . hydrochlorothiazide (HYDRODIURIL) 25 MG tablet TAKE 1 TABLET BY MOUTH EVERY MORNING FOR BLOOD PRESSURE (Patient not taking: Reported on 06/16/2018)  . hydrochlorothiazide (HYDRODIURIL) 25 MG tablet TAKE 1 TABLET BY MOUTH EVERY MORNING FOR BLOOD PRESSURE (Patient not taking: Reported on 06/16/2018)   No facility-administered encounter medications on file as of 06/16/2018.     Surgical History: Past Surgical History:  Procedure Laterality Date  . none      Medical History: Past Medical History:  Diagnosis Date  . Anxiety   . COPD (chronic obstructive pulmonary disease) (HCC)   . HTN (hypertension)   . Pneumonia 09/18/2017  . Tobacco abuse     Family History: Family History  Problem Relation Age of Onset  . Emphysema Brother   . Lung disease  Mother   . Heart disease Father       Review of Systems  Constitutional: Negative for chills, fatigue and unexpected weight change.  HENT: Negative for congestion, rhinorrhea, sneezing and sore throat.   Eyes: Negative for photophobia, pain and redness.  Respiratory: Negative for cough, chest tightness and shortness of breath.   Cardiovascular: Negative for chest pain and palpitations.  Gastrointestinal: Negative for abdominal pain, constipation, diarrhea, nausea and vomiting.  Endocrine: Negative.    Genitourinary: Negative for dysuria and frequency.  Musculoskeletal: Negative for arthralgias, back pain, joint swelling and neck pain.  Skin: Negative for rash.  Allergic/Immunologic: Negative.   Neurological: Negative for tremors and numbness.  Hematological: Negative for adenopathy. Does not bruise/bleed easily.  Psychiatric/Behavioral: Negative for behavioral problems and sleep disturbance. The patient is not nervous/anxious.      Vital Signs: BP 135/70   Pulse 72   Resp 16   Ht 5\' 5"  (1.651 m)   Wt 177 lb (80.3 kg)   SpO2 97%   BMI 29.45 kg/m    Physical Exam  Constitutional: She is oriented to person, place, and time. She appears well-developed and well-nourished. No distress.  HENT:  Head: Normocephalic and atraumatic.  Mouth/Throat: Oropharynx is clear and moist. No oropharyngeal exudate.  Eyes: Pupils are equal, round, and reactive to light. EOM are normal.  Neck: Normal range of motion. Neck supple. No JVD present. No tracheal deviation present. No thyromegaly present.  Cardiovascular: Normal rate, regular rhythm and normal heart sounds. Exam reveals no gallop and no friction rub.  No murmur heard. Pulmonary/Chest: Effort normal and breath sounds normal. No respiratory distress. She has no wheezes. She has no rales. She exhibits no tenderness.  Abdominal: Soft. There is no tenderness. There is no guarding.  Musculoskeletal: Normal range of motion.  Lymphadenopathy:    She has no cervical adenopathy.  Neurological: She is alert and oriented to person, place, and time. No cranial nerve deficit.  Skin: Skin is warm and dry. She is not diaphoretic.  Psychiatric: She has a normal mood and affect. Her behavior is normal. Judgment and thought content normal.  Nursing note and vitals reviewed.    LABS: No results found for this or any previous visit (from the past 2160 hour(s)).   Assessment/Plan: 1. Encounter for general adult medical examination with abnormal  findings Will follow up with lab values as available.  CBC CMP TSH Free T4 Cholesterol panel  2. Obstructive chronic bronchitis without exacerbation (HCC) Continue use of inhalers and nebulizer. Stable at this time.  - albuterol (PROAIR HFA) 108 (90 Base) MCG/ACT inhaler; Inhale 2 puffs into the lungs every 4 (four) hours as needed for wheezing or shortness of breath.  Dispense: 17 g; Refill: 0  3. Essential hypertension, benign Continue medications as ordered.  - losartan (COZAAR) 25 MG tablet; Take 1 tablet (25 mg total) by mouth daily.  Dispense: 30 tablet; Refill: 3  4. Generalized anxiety disorder Continue to use Xanax as needed.   - ALPRAZolam (XANAX) 0.25 MG tablet; Half to one tab po qd prn for anxiety  Dispense: 20 tablet; Refill: 0  5. Nicotine dependence, cigarettes, with other nicotine-induced disorders Smoking cessation counseling: 1. Pt acknowledges the risks of long term smoking, she will try to quite smoking. 2. Options for different medications including nicotine products, chewing gum, patch etc, Wellbutrin and Chantix is discussed 3. Goal and date of compete cessation is discussed 4. Total time spent in smoking cessation is 15 min.  6. Flu vaccine need - Flu Vaccine MDCK QUAD PF  7. Multiple allergies - loratadine (CLARITIN) 10 MG tablet; Take 1 tablet (10 mg total) by mouth daily. - cetirizine (ZYRTEC) 10 MG tablet; Take 1 tablet (10 mg total) by mouth daily.  Dispense: 30 tablet; Refill: 11  8. Oral candidiasis Pt has been using Listerine multiple times a day and appears to have developed thrush. Instructed her to stop using listerine for a few weeks and to use RX mouthwash.   Magic mouthwash order called into pharmacy.    General Counseling: Meegan verbalizes understanding of the findings of todays visit and agrees with plan of treatment. I have discussed any further diagnostic evaluation that may be needed or ordered today. We also reviewed her medications  today. she has been encouraged to call the office with any questions or concerns that should arise related to todays visit.   Orders Placed This Encounter  Procedures  . Flu Vaccine MDCK QUAD PF    No orders of the defined types were placed in this encounter.   Time spent: 30 Minutes   This patient was seen by Blima Ledger AGNP-C in Collaboration with Dr Lyndon Code as a part of collaborative care agreement   Lyndon Code, MD  Internal Medicine

## 2018-06-16 NOTE — Patient Instructions (Signed)
Oral Thrush, Adult Oral thrush is an infection in your mouth and throat. It causes white patches on your tongue and in your mouth. Follow these instructions at home: Helping with soreness  To lessen your pain: ? Drink cold liquids, like water and iced tea. ? Eat frozen ice pops or frozen juices. ? Eat foods that are easy to swallow, like gelatin and ice cream. ? Drink from a straw if the patches in your mouth are painful. General instructions   Take or use over-the-counter and prescription medicines only as told by your doctor. Medicine for oral thrush may be something to swallow, or it may be something to put on the infected area.  Eat plain yogurt that has live cultures in it. Read the label to make sure.  If you wear dentures: ? Take out your dentures before you go to bed. ? Brush them well. ? Soak them in a denture cleaner.  Rinse your mouth with warm salt-water many times a day. To make the salt-water mixture, completely dissolve 1/2-1 teaspoon of salt in 1 cup of warm water. Contact a doctor if:  Your problems are getting worse.  Your problems do not get better in less than 7 days with treatment.  Your infection is spreading. This may show as white patches on the skin outside of your mouth.  You are nursing your baby and you have redness and pain in the nipples. This information is not intended to replace advice given to you by your health care provider. Make sure you discuss any questions you have with your health care provider. Document Released: 12/17/2009 Document Revised: 06/16/2016 Document Reviewed: 06/16/2016 Elsevier Interactive Patient Education  2017 [employer]  

## 2018-07-28 ENCOUNTER — Other Ambulatory Visit: Payer: Self-pay

## 2018-08-13 ENCOUNTER — Other Ambulatory Visit: Payer: Self-pay | Admitting: Nurse Practitioner

## 2018-08-13 ENCOUNTER — Other Ambulatory Visit: Payer: Self-pay | Admitting: Internal Medicine

## 2018-08-13 DIAGNOSIS — I10 Essential (primary) hypertension: Secondary | ICD-10-CM

## 2018-09-27 ENCOUNTER — Other Ambulatory Visit: Payer: Self-pay

## 2018-09-27 MED ORDER — IPRATROPIUM-ALBUTEROL 0.5-2.5 (3) MG/3ML IN SOLN: RESPIRATORY_TRACT | 5 refills | Status: DC

## 2018-10-04 ENCOUNTER — Other Ambulatory Visit: Payer: Self-pay | Admitting: Adult Health

## 2018-10-04 DIAGNOSIS — I10 Essential (primary) hypertension: Secondary | ICD-10-CM

## 2018-10-06 ENCOUNTER — Other Ambulatory Visit: Payer: Self-pay | Admitting: Internal Medicine

## 2018-10-06 DIAGNOSIS — J449 Chronic obstructive pulmonary disease, unspecified: Secondary | ICD-10-CM

## 2018-10-08 DIAGNOSIS — R0600 Dyspnea, unspecified: Secondary | ICD-10-CM | POA: Diagnosis not present

## 2018-10-08 DIAGNOSIS — R062 Wheezing: Secondary | ICD-10-CM | POA: Diagnosis not present

## 2018-10-08 DIAGNOSIS — J441 Chronic obstructive pulmonary disease with (acute) exacerbation: Secondary | ICD-10-CM | POA: Diagnosis not present

## 2018-10-18 DIAGNOSIS — R0600 Dyspnea, unspecified: Secondary | ICD-10-CM | POA: Diagnosis not present

## 2018-10-18 DIAGNOSIS — J441 Chronic obstructive pulmonary disease with (acute) exacerbation: Secondary | ICD-10-CM | POA: Diagnosis not present

## 2018-10-18 DIAGNOSIS — R062 Wheezing: Secondary | ICD-10-CM | POA: Diagnosis not present

## 2018-11-06 DIAGNOSIS — R05 Cough: Secondary | ICD-10-CM | POA: Diagnosis not present

## 2018-11-06 DIAGNOSIS — J441 Chronic obstructive pulmonary disease with (acute) exacerbation: Secondary | ICD-10-CM | POA: Diagnosis not present

## 2018-11-06 DIAGNOSIS — R0602 Shortness of breath: Secondary | ICD-10-CM | POA: Diagnosis not present

## 2018-11-06 DIAGNOSIS — I10 Essential (primary) hypertension: Secondary | ICD-10-CM | POA: Diagnosis not present

## 2018-11-06 DIAGNOSIS — J9601 Acute respiratory failure with hypoxia: Secondary | ICD-10-CM | POA: Diagnosis not present

## 2018-11-06 DIAGNOSIS — Z8673 Personal history of transient ischemic attack (TIA), and cerebral infarction without residual deficits: Secondary | ICD-10-CM | POA: Diagnosis not present

## 2018-11-06 DIAGNOSIS — Z87891 Personal history of nicotine dependence: Secondary | ICD-10-CM | POA: Diagnosis not present

## 2018-11-06 DIAGNOSIS — Z882 Allergy status to sulfonamides status: Secondary | ICD-10-CM | POA: Diagnosis not present

## 2018-11-07 DIAGNOSIS — Z8673 Personal history of transient ischemic attack (TIA), and cerebral infarction without residual deficits: Secondary | ICD-10-CM | POA: Diagnosis not present

## 2018-11-07 DIAGNOSIS — Z87891 Personal history of nicotine dependence: Secondary | ICD-10-CM | POA: Diagnosis not present

## 2018-11-07 DIAGNOSIS — R0600 Dyspnea, unspecified: Secondary | ICD-10-CM | POA: Diagnosis not present

## 2018-11-07 DIAGNOSIS — Z7951 Long term (current) use of inhaled steroids: Secondary | ICD-10-CM | POA: Diagnosis not present

## 2018-11-07 DIAGNOSIS — J40 Bronchitis, not specified as acute or chronic: Secondary | ICD-10-CM | POA: Diagnosis not present

## 2018-11-07 DIAGNOSIS — F17210 Nicotine dependence, cigarettes, uncomplicated: Secondary | ICD-10-CM | POA: Diagnosis not present

## 2018-11-07 DIAGNOSIS — F411 Generalized anxiety disorder: Secondary | ICD-10-CM | POA: Diagnosis present

## 2018-11-07 DIAGNOSIS — F17200 Nicotine dependence, unspecified, uncomplicated: Secondary | ICD-10-CM | POA: Diagnosis not present

## 2018-11-07 DIAGNOSIS — F410 Panic disorder [episodic paroxysmal anxiety] without agoraphobia: Secondary | ICD-10-CM | POA: Diagnosis not present

## 2018-11-07 DIAGNOSIS — Z7982 Long term (current) use of aspirin: Secondary | ICD-10-CM | POA: Diagnosis not present

## 2018-11-07 DIAGNOSIS — Z882 Allergy status to sulfonamides status: Secondary | ICD-10-CM | POA: Diagnosis not present

## 2018-11-07 DIAGNOSIS — R064 Hyperventilation: Secondary | ICD-10-CM | POA: Diagnosis not present

## 2018-11-07 DIAGNOSIS — I499 Cardiac arrhythmia, unspecified: Secondary | ICD-10-CM | POA: Diagnosis not present

## 2018-11-07 DIAGNOSIS — J441 Chronic obstructive pulmonary disease with (acute) exacerbation: Secondary | ICD-10-CM | POA: Diagnosis not present

## 2018-11-07 DIAGNOSIS — I10 Essential (primary) hypertension: Secondary | ICD-10-CM | POA: Diagnosis not present

## 2018-11-07 DIAGNOSIS — F419 Anxiety disorder, unspecified: Secondary | ICD-10-CM | POA: Diagnosis not present

## 2018-11-07 DIAGNOSIS — J9601 Acute respiratory failure with hypoxia: Secondary | ICD-10-CM | POA: Diagnosis present

## 2018-11-11 MED ORDER — LIDOCAINE HCL 200 MG/10ML IJ SOSY
2.50 | PREFILLED_SYRINGE | INTRAMUSCULAR | Status: DC
Start: ? — End: 2018-11-11

## 2018-11-11 MED ORDER — GENTAMICIN SULFATE 10 MG/ML IJ SOLN
1.00 | INTRAMUSCULAR | Status: DC
Start: 2018-11-12 — End: 2018-11-11

## 2018-11-11 MED ORDER — GENERIC EXTERNAL MEDICATION
2.00 | Status: DC
Start: ? — End: 2018-11-11

## 2018-11-11 MED ORDER — MONTELUKAST SODIUM 10 MG PO TABS
10.00 | ORAL_TABLET | ORAL | Status: DC
Start: 2018-11-11 — End: 2018-11-11

## 2018-11-11 MED ORDER — CVS EAR DROPS OT
40.00 | OTIC | Status: DC
Start: 2018-11-12 — End: 2018-11-11

## 2018-11-11 MED ORDER — QUETIAPINE FUMARATE 25 MG PO TABS
12.50 | ORAL_TABLET | ORAL | Status: DC
Start: 2018-11-11 — End: 2018-11-11

## 2018-11-11 MED ORDER — DOXYCYCLINE HYCLATE 100 MG PO TABS
100.00 | ORAL_TABLET | ORAL | Status: DC
Start: 2018-11-11 — End: 2018-11-11

## 2018-11-11 MED ORDER — GUAIFENESIN 100 MG/5ML PO SYRP
200.00 | ORAL_SOLUTION | ORAL | Status: DC
Start: ? — End: 2018-11-11

## 2018-11-11 MED ORDER — MAGNESIUM OXIDE 400 MG PO TABS
400.00 | ORAL_TABLET | ORAL | Status: DC
Start: 2018-11-11 — End: 2018-11-11

## 2018-11-11 MED ORDER — METOPROLOL SUCCINATE ER 50 MG PO TB24
50.00 | ORAL_TABLET | ORAL | Status: DC
Start: 2018-11-12 — End: 2018-11-11

## 2018-11-11 MED ORDER — PREDNISONE 20 MG PO TABS
40.00 | ORAL_TABLET | ORAL | Status: DC
Start: 2018-11-12 — End: 2018-11-11

## 2018-11-11 MED ORDER — QUETIAPINE FUMARATE 25 MG PO TABS
12.50 | ORAL_TABLET | ORAL | Status: DC
Start: ? — End: 2018-11-11

## 2018-11-11 MED ORDER — MENTHOL 9.1 MG MT LOZG
1.00 | LOZENGE | OROMUCOSAL | Status: DC
Start: ? — End: 2018-11-11

## 2018-11-11 MED ORDER — LORAZEPAM 2 MG/ML IJ SOLN
.50 | INTRAMUSCULAR | Status: DC
Start: ? — End: 2018-11-11

## 2018-11-11 MED ORDER — ALBUTEROL SULFATE (2.5 MG/3ML) 0.083% IN NEBU
2.50 | INHALATION_SOLUTION | RESPIRATORY_TRACT | Status: DC
Start: 2018-11-11 — End: 2018-11-11

## 2018-11-11 MED ORDER — IPRATROPIUM BROMIDE 0.02 % IN SOLN
500.00 | RESPIRATORY_TRACT | Status: DC
Start: 2018-11-11 — End: 2018-11-11

## 2018-11-11 MED ORDER — ASPIRIN 81 MG PO CHEW
81.00 | CHEWABLE_TABLET | ORAL | Status: DC
Start: 2018-11-12 — End: 2018-11-11

## 2018-11-11 MED ORDER — BUDESONIDE-FORMOTEROL FUMARATE 160-4.5 MCG/ACT IN AERO
2.00 | INHALATION_SPRAY | RESPIRATORY_TRACT | Status: DC
Start: 2018-11-11 — End: 2018-11-11

## 2018-11-16 ENCOUNTER — Ambulatory Visit (INDEPENDENT_AMBULATORY_CARE_PROVIDER_SITE_OTHER): Payer: Medicare Other | Admitting: Adult Health

## 2018-11-16 ENCOUNTER — Encounter: Payer: Self-pay | Admitting: Adult Health

## 2018-11-16 VITALS — BP 122/68 | HR 78 | Resp 16 | Ht 65.0 in | Wt 179.0 lb

## 2018-11-16 DIAGNOSIS — F411 Generalized anxiety disorder: Secondary | ICD-10-CM

## 2018-11-16 DIAGNOSIS — F17218 Nicotine dependence, cigarettes, with other nicotine-induced disorders: Secondary | ICD-10-CM

## 2018-11-16 DIAGNOSIS — I10 Essential (primary) hypertension: Secondary | ICD-10-CM | POA: Diagnosis not present

## 2018-11-16 DIAGNOSIS — J449 Chronic obstructive pulmonary disease, unspecified: Secondary | ICD-10-CM | POA: Diagnosis not present

## 2018-11-16 DIAGNOSIS — F419 Anxiety disorder, unspecified: Secondary | ICD-10-CM | POA: Diagnosis not present

## 2018-11-16 DIAGNOSIS — R0602 Shortness of breath: Secondary | ICD-10-CM | POA: Diagnosis not present

## 2018-11-16 MED ORDER — ALBUTEROL SULFATE HFA 108 (90 BASE) MCG/ACT IN AERS: 2.0000 | INHALATION_SPRAY | RESPIRATORY_TRACT | 2 refills | Status: DC | PRN

## 2018-11-16 MED ORDER — QUETIAPINE FUMARATE 25 MG PO TABS: 25.0000 mg | ORAL_TABLET | Freq: Every day | ORAL | 0 refills | Status: DC | PRN

## 2018-11-16 MED ORDER — ALPRAZOLAM 0.25 MG PO TABS: ORAL_TABLET | ORAL | 0 refills | Status: DC

## 2018-11-16 NOTE — Progress Notes (Signed)
[facility] [address] [place], [place] [address]  Internal MEDICINE  Office Visit Note  Patient Name: Alicia Rivera  [id number]  [id number]  Date of Service: 01/05/2019     Chief Complaint  Patient presents with  . COPD    hospital follow up     HPI Pt is here for recent hospital follow up. She went to the hospital on 11/07/2018 due to SOB at home.  She reports "My anxiety went through the roof".  She apparently spent 5 days in the hospital, and was discharged home.  She was given and RX for Seroquel and told to take 1/2 tab twice daily for two weeks and then use it PRN.  Pt reports it is working well at this time.  She has a few more days of taking it BID, and then states "I'll probably only need it once in a blue moon".  Otherwise she is doing well. Denies any complaints at this time.    Current Medication: Outpatient Encounter Medications as of 11/16/2018  Medication Sig  . aspirin EC 81 MG tablet Take 81 mg by mouth daily.  . cetirizine (ZYRTEC) 10 MG tablet Take 1 tablet (10 mg total) by mouth daily.  Marland Kitchen DALIRESP 500 MCG TABS tablet TAKE 1 TABLET BY MOUTH EVERY DAY  . ipratropium-albuterol (DUONEB) 0.5-2.5 (3) MG/3ML SOLN USE 3 ML VIA NEBULIZER EVERY 6 HOURS AS NEEDED diag j44.1  . SPIRIVA HANDIHALER 18 MCG inhalation capsule PLACE 1 C INTO INHALER AND INHALE D.  . [DISCONTINUED] albuterol (PROAIR HFA) 108 (90 Base) MCG/ACT inhaler Inhale 2 puffs into the lungs every 4 (four) hours as needed for wheezing or shortness of breath.  . [DISCONTINUED] albuterol (PROAIR HFA) 108 (90 Base) MCG/ACT inhaler Inhale 2 puffs into the lungs every 4 (four) hours as needed for wheezing or shortness of breath.  . [DISCONTINUED] ALPRAZolam (XANAX) 0.25 MG tablet Half to one tab po qd prn for anxiety  . [DISCONTINUED] ALPRAZolam (XANAX) 0.25 MG tablet Half to one tab po qd prn for anxiety  . [DISCONTINUED] DULoxetine (CYMBALTA) 30 MG capsule Take 1 capsule (30 mg total) by mouth  daily.  . [DISCONTINUED] losartan (COZAAR) 25 MG tablet TAKE 1 TABLET(25 MG) BY MOUTH DAILY  . [DISCONTINUED] metoprolol succinate (TOPROL-XL) 50 MG 24 hr tablet TAKE 1 TABLET BY MOUTH DAILY, TAKE WITH OR IMMEDIATELY FOLLOWING A MEAL  . [DISCONTINUED] montelukast (SINGULAIR) 10 MG tablet Take 1 tablet (10 mg total) by mouth at bedtime.  . [DISCONTINUED] QUEtiapine (SEROQUEL) 25 MG tablet Take 0.5 tabs (12.5mg  total) by mouth twice daily for 2 weeks, then take only as needed for severe anxiety.  . [DISCONTINUED] QUEtiapine (SEROQUEL) 25 MG tablet Take 1 tablet (25 mg total) by mouth daily as needed.  . [DISCONTINUED] SYMBICORT 160-4.5 MCG/ACT inhaler INHALE 1 PUFF BY MOUTH TWICE DAILY  . [DISCONTINUED] tiotropium (SPIRIVA) 18 MCG inhalation capsule Place into inhaler and inhale.  . [DISCONTINUED] budesonide (PULMICORT) 0.25 MG/2ML nebulizer solution Take 2 mLs (0.25 mg total) by nebulization 2 (two) times daily. (Patient not taking: Reported on 11/16/2018)  . [DISCONTINUED] budesonide-formoterol (SYMBICORT) 160-4.5 MCG/ACT inhaler Inhale into the lungs.  . [DISCONTINUED] loratadine (CLARITIN) 10 MG tablet Take 1 tablet (10 mg total) by mouth daily. (Patient not taking: Reported on 11/16/2018)  . [DISCONTINUED] magic mouthwash SOLN Take 5 mLs by mouth 3 (three) times daily. Swish and spit (Patient not taking: Reported on 11/16/2018)   No facility-administered encounter medications on file as of 11/16/2018.  Surgical History: Past Surgical History:  Procedure Laterality Date  . none      Medical History: Past Medical History:  Diagnosis Date  . Anxiety   . COPD (chronic obstructive pulmonary disease) (HCC)   . HTN (hypertension)   . Pneumonia 09/18/2017  . Tobacco abuse     Family History: Family History  Problem Relation Age of Onset  . Emphysema Brother   . Lung disease Mother   . Heart disease Father     Social History   Socioeconomic History  . Marital status: Single     Spouse name: Not on file  . Number of children: Not on file  . Years of education: Not on file  . Highest education level: Not on file  Occupational History  . Occupation: disabled  Social Needs  . Financial resource strain: Not on file  . Food insecurity:    Worry: Not on file    Inability: Not on file  . Transportation needs:    Medical: Not on file    Non-medical: Not on file  Tobacco Use  . Smoking status: Former Games developer  . Smokeless tobacco: Former Neurosurgeon  . Tobacco comment: quit 6 weeks ago  Substance and Sexual Activity  . Alcohol use: No  . Drug use: No  . Sexual activity: Never  Lifestyle  . Physical activity:    Days per week: Not on file    Minutes per session: Not on file  . Stress: Not on file  Relationships  . Social connections:    Talks on phone: Not on file    Gets together: Not on file    Attends religious service: Not on file    Active member of club or organization: Not on file    Attends meetings of clubs or organizations: Not on file    Relationship status: Not on file  . Intimate partner violence:    Fear of current or ex partner: Not on file    Emotionally abused: Not on file    Physically abused: Not on file    Forced sexual activity: Not on file  Other Topics Concern  . Not on file  Social History Narrative  . Not on file      Review of Systems  Constitutional: Negative for chills, fatigue and unexpected weight change.  HENT: Negative for congestion, rhinorrhea, sneezing and sore throat.   Eyes: Negative for photophobia, pain and redness.  Respiratory: Negative for cough, chest tightness and shortness of breath.   Cardiovascular: Negative for chest pain and palpitations.  Gastrointestinal: Negative for abdominal pain, constipation, diarrhea, nausea and vomiting.  Endocrine: Negative.   Genitourinary: Negative for dysuria and frequency.  Musculoskeletal: Negative for arthralgias, back pain, joint swelling and neck pain.  Skin: Negative  for rash.  Allergic/Immunologic: Negative.   Neurological: Negative for tremors and numbness.  Hematological: Negative for adenopathy. Does not bruise/bleed easily.  Psychiatric/Behavioral: Negative for behavioral problems and sleep disturbance. The patient is not nervous/anxious.     Vital Signs: BP 122/68 (BP Location: Left Arm, Patient Position: Sitting, Cuff Size: Normal)   Pulse 78   Resp 16   Ht 5\' 5"  (1.651 m)   Wt 179 lb (81.2 kg)   SpO2 95%   BMI 29.79 kg/m    Physical Exam Vitals signs and nursing note reviewed.  Constitutional:      General: She is not in acute distress.    Appearance: She is well-developed. She is not diaphoretic.  HENT:  Head: Normocephalic and atraumatic.     Mouth/Throat:     Pharynx: No oropharyngeal exudate.  Eyes:     Pupils: Pupils are equal, round, and reactive to light.  Neck:     Musculoskeletal: Normal range of motion and neck supple.     Thyroid: No thyromegaly.     Vascular: No JVD.     Trachea: No tracheal deviation.  Cardiovascular:     Rate and Rhythm: Normal rate and regular rhythm.     Heart sounds: Normal heart sounds. No murmur. No friction rub. No gallop.   Pulmonary:     Effort: Pulmonary effort is normal. No respiratory distress.     Breath sounds: Normal breath sounds. No wheezing or rales.  Chest:     Chest wall: No tenderness.  Abdominal:     Palpations: Abdomen is soft.     Tenderness: There is no abdominal tenderness. There is no guarding.  Musculoskeletal: Normal range of motion.  Lymphadenopathy:     Cervical: No cervical adenopathy.  Skin:    General: Skin is warm and dry.  Neurological:     Mental Status: She is alert and oriented to person, place, and time.     Cranial Nerves: No cranial nerve deficit.  Psychiatric:        Behavior: Behavior normal.        Thought Content: Thought content normal.        Judgment: Judgment normal.    Assessment/Plan: 1. Anxiety Refill patient's Seroquel so  that she may have them on hand for as needed basis.  She reports her anxiety has been much better since she is finishing her course of the Seroquel.  We will continue to follow at future visits. - QUEtiapine (SEROQUEL) 25 MG tablet; Take 1 tablet (25 mg total) by mouth daily as needed.  Dispense: 30 tablet; Refill: 0  2. Obstructive chronic bronchitis without exacerbation (HCC) Severe disease.  Refilled patient's albuterol inhaler at that time.  Encourage patient to continue medications as prescribed. - albuterol (PROAIR HFA) 108 (90 Base) MCG/ACT inhaler; Inhale 2 puffs into the lungs every 4 (four) hours as needed for wheezing or shortness of breath.  Dispense: 2 Inhaler; Refill: 2  3. Essential hypertension, benign Well controlled, currently 122/68.  Continue present therapy.  4. Nicotine dependence, cigarettes, with other nicotine-induced disorders Smoking cessation counseling: 1. Pt acknowledges the risks of long term smoking, she will try to quite smoking. 2. Options for different medications including nicotine products, chewing gum, patch etc, Wellbutrin and Chantix is discussed 3. Goal and date of compete cessation is discussed 4. Total time spent in smoking cessation is 15 min.  5. SOB (shortness of breath) FVC is 2.2 which is 60% of the pre-predicted value FEV1 is 1.1 which is 44% of the pre-predicted value FEV1/FVC is 49% which is 64% of the pre-predicted value on today spirometry. - Spirometry with Graph  General Counseling: Kerriann verbalizes understanding of the findings of todays visit and agrees with plan of treatment. I have discussed any further diagnostic evaluation that may be needed or ordered today. We also reviewed her medications today. she has been encouraged to call the office with any questions or concerns that should arise related to todays visit.   Orders Placed This Encounter  Procedures  . Spirometry with Graph      I have reviewed all medical records  from hospital follow up including radiology reports and consults from other physicians. Appropriate follow up diagnostics will be  scheduled as needed. Patient/ Family understands the plan of treatment.  Time spent 30 minutes.   Blima LedgerAdam Naleyah Ohlinger AGNP-C Internal Medicine

## 2018-11-24 ENCOUNTER — Telehealth: Payer: Self-pay

## 2018-11-24 ENCOUNTER — Other Ambulatory Visit: Payer: Self-pay

## 2018-11-24 DIAGNOSIS — I10 Essential (primary) hypertension: Secondary | ICD-10-CM

## 2018-11-24 DIAGNOSIS — J449 Chronic obstructive pulmonary disease, unspecified: Secondary | ICD-10-CM

## 2018-11-24 DIAGNOSIS — F411 Generalized anxiety disorder: Secondary | ICD-10-CM

## 2018-11-24 MED ORDER — ALBUTEROL SULFATE HFA 108 (90 BASE) MCG/ACT IN AERS: 2.0000 | INHALATION_SPRAY | RESPIRATORY_TRACT | 2 refills | Status: DC | PRN

## 2018-11-24 MED ORDER — BUDESONIDE-FORMOTEROL FUMARATE 160-4.5 MCG/ACT IN AERO: 1.0000 | INHALATION_SPRAY | Freq: Two times a day (BID) | RESPIRATORY_TRACT | 2 refills | Status: DC

## 2018-11-24 MED ORDER — METOPROLOL SUCCINATE ER 50 MG PO TB24: ORAL_TABLET | ORAL | 0 refills | Status: DC

## 2018-11-24 MED ORDER — DULOXETINE HCL 30 MG PO CPEP: 30.0000 mg | ORAL_CAPSULE | Freq: Every day | ORAL | 3 refills | Status: DC

## 2018-11-24 NOTE — Telephone Encounter (Signed)
Left message for pt to give [place] a call back in regards to her voicemail that she left about fixing her medication.

## 2018-11-29 ENCOUNTER — Telehealth: Payer: Self-pay | Admitting: Adult Health

## 2018-11-29 NOTE — Telephone Encounter (Signed)
Prior authorization for budesonide-formoterol fumrate 160 has been approved by insurance

## 2018-12-15 ENCOUNTER — Other Ambulatory Visit: Payer: Self-pay

## 2018-12-15 ENCOUNTER — Encounter: Payer: Self-pay | Admitting: Adult Health

## 2018-12-15 ENCOUNTER — Ambulatory Visit (INDEPENDENT_AMBULATORY_CARE_PROVIDER_SITE_OTHER): Payer: Medicare Other | Admitting: Adult Health

## 2018-12-15 ENCOUNTER — Other Ambulatory Visit: Payer: Self-pay | Admitting: Adult Health

## 2018-12-15 VITALS — BP 128/78 | HR 65 | Resp 16 | Ht 65.0 in | Wt 181.0 lb

## 2018-12-15 DIAGNOSIS — Z1231 Encounter for screening mammogram for malignant neoplasm of breast: Secondary | ICD-10-CM | POA: Diagnosis not present

## 2018-12-15 DIAGNOSIS — J449 Chronic obstructive pulmonary disease, unspecified: Secondary | ICD-10-CM | POA: Diagnosis not present

## 2018-12-15 DIAGNOSIS — I10 Essential (primary) hypertension: Secondary | ICD-10-CM

## 2018-12-15 DIAGNOSIS — E782 Mixed hyperlipidemia: Secondary | ICD-10-CM | POA: Diagnosis not present

## 2018-12-15 DIAGNOSIS — Z889 Allergy status to unspecified drugs, medicaments and biological substances status: Secondary | ICD-10-CM | POA: Diagnosis not present

## 2018-12-15 DIAGNOSIS — F411 Generalized anxiety disorder: Secondary | ICD-10-CM | POA: Diagnosis not present

## 2018-12-15 DIAGNOSIS — F17218 Nicotine dependence, cigarettes, with other nicotine-induced disorders: Secondary | ICD-10-CM

## 2018-12-15 MED ORDER — LOSARTAN POTASSIUM 25 MG PO TABS: 25.0000 mg | ORAL_TABLET | Freq: Every day | ORAL | 1 refills | Status: DC

## 2018-12-15 MED ORDER — ALBUTEROL SULFATE HFA 108 (90 BASE) MCG/ACT IN AERS: 2.0000 | INHALATION_SPRAY | RESPIRATORY_TRACT | 4 refills | Status: DC | PRN

## 2018-12-15 MED ORDER — QUETIAPINE FUMARATE 25 MG PO TABS: 25.0000 mg | ORAL_TABLET | Freq: Every day | ORAL | 1 refills | Status: DC | PRN

## 2018-12-15 MED ORDER — ALPRAZOLAM 0.25 MG PO TABS: ORAL_TABLET | ORAL | 0 refills | Status: DC

## 2018-12-15 MED ORDER — MONTELUKAST SODIUM 10 MG PO TABS: 10.0000 mg | ORAL_TABLET | Freq: Every day | ORAL | 3 refills | Status: DC

## 2018-12-15 MED ORDER — BUDESONIDE-FORMOTEROL FUMARATE 160-4.5 MCG/ACT IN AERO: 1.0000 | INHALATION_SPRAY | Freq: Two times a day (BID) | RESPIRATORY_TRACT | 2 refills | Status: DC

## 2018-12-15 MED ORDER — PNEUMOCOCCAL 13-VAL CONJ VACC IM SUSP: 0.5000 mL | INTRAMUSCULAR | 0 refills | Status: AC

## 2018-12-15 MED ORDER — METOPROLOL SUCCINATE ER 50 MG PO TB24: ORAL_TABLET | ORAL | 2 refills | Status: DC

## 2018-12-15 NOTE — Patient Instructions (Signed)
Allergies, Adult  An allergy means that your body reacts to something that bothers it (allergen). It is not a normal reaction. This can happen from something that you:   Eat.   Breathe in.   Touch.  You can have an allergy (be allergic) to:   Outdoor things, like:  ? Pollen.  ? Grass.  ? Weeds.   Indoor things, like:  ? Dust.  ? Smoke.  ? Pet dander.   Foods.   Medicines.   Things that bother your skin, like:  ? Detergents.  ? Chemicals.  ? Latex.   Perfume.   Bugs.  An allergy cannot spread from person to person (is not contagious).  Follow these instructions at home:               Stay away from things that you know you are allergic to.   If you have allergies to things in the air, wash out your nose each day. Do it with one of these:  ? A salt-water (saline) spray.  ? A container (neti pot).   Take over-the-counter and prescription medicines only as told by your doctor.   Keep all follow-up visits as told by your doctor. This is important.   If you are at risk for a very bad allergy reaction (anaphylaxis), keep an auto-injector with you all the time. This is called an epinephrine injection.  ? This is pre-measured medicine with a needle. You can put it into your skin by yourself.  ? Right after you have a very bad allergy reaction, you or a person with you must give the medicine in less than a few minutes. This is an emergency.   If you have ever had a very bad allergy reaction, wear a medical alert bracelet or necklace. Your very bad allergy should be written on it.  Contact a health care provider if:   Your symptoms do not get better with treatment.  Get help right away if:   You have symptoms of a very bad allergy reaction. These include:  ? A swollen mouth, tongue, or throat.  ? Pain or tightness in your chest.  ? Trouble breathing.  ? Being short of breath.  ? Dizziness.  ? Fainting.  ? Very bad pain in your belly (abdomen).  ? Throwing up (vomiting).  ? Watery poop  (diarrhea).  Summary   An allergy means that your body reacts to something that bothers it (allergen). It is not a normal reaction.   Stay away from things that make your body react.   Take over-the-counter and prescription medicines only as told by your doctor.   If you are at risk for a very bad allergy reaction, carry an auto-injector (epinephrine injection) all the time. Also, wear a medical alert bracelet or necklace so people know about your allergy.  This information is not intended to replace advice given to you by your health care provider. Make sure you discuss any questions you have with your health care provider.  Document Released: 01/17/2013 Document Revised: 01/05/2017 Document Reviewed: 01/05/2017  Elsevier Interactive Patient Education  2019 Elsevier Inc[employer]

## 2018-12-15 NOTE — Progress Notes (Signed)
[facility] [address] [place], [place] [address]  Internal MEDICINE  Office Visit Note  Patient Name: Alicia Rivera  [id number]  [id number]  Date of Service: 12/15/2018  Chief Complaint  Patient presents with  . Hypertension  . Anxiety  . Quality Metric Gaps    mammogram needed , pneumonia vaccine needed     HPI Pt here for follow up on htn, anxiety, and allergies. She had orders to get her blood drawn but she was not able to do that at this time. She reports her allergies have been flaring up, and she has been taking her medications with some relief. Her blood pressure has been well controlled.  Denies chest pain, sob, palpitations or headaches. She is in need of a mammogram and a PNA vaccine to close her metric gaps.  Both will be ordered today.     Current Medication: Outpatient Encounter Medications as of 12/15/2018  Medication Sig  . albuterol (PROAIR HFA) 108 (90 Base) MCG/ACT inhaler Inhale 2 puffs into the lungs every 4 (four) hours as needed for wheezing or shortness of breath.  . ALPRAZolam (XANAX) 0.25 MG tablet Half to one tab po qd prn for anxiety  . aspirin EC 81 MG tablet Take 81 mg by mouth daily.  . budesonide-formoterol (SYMBICORT) 160-4.5 MCG/ACT inhaler Inhale 1 puff into the lungs 2 (two) times daily.  . cetirizine (ZYRTEC) 10 MG tablet Take 1 tablet (10 mg total) by mouth daily.  Marland Kitchen DALIRESP 500 MCG TABS tablet TAKE 1 TABLET BY MOUTH EVERY DAY  . DULoxetine (CYMBALTA) 30 MG capsule Take 1 capsule (30 mg total) by mouth daily.  Marland Kitchen ipratropium-albuterol (DUONEB) 0.5-2.5 (3) MG/3ML SOLN USE 3 ML VIA NEBULIZER EVERY 6 HOURS AS NEEDED diag j44.1  . losartan (COZAAR) 25 MG tablet Take 1 tablet (25 mg total) by mouth daily.  . metoprolol succinate (TOPROL-XL) 50 MG 24 hr tablet TAKE 1 TABLET BY MOUTH DAILY, TAKE WITH OR IMMEDIATELY FOLLOWING A MEAL  . montelukast (SINGULAIR) 10 MG tablet Take 1 tablet (10 mg total) by mouth at bedtime.  Marland Kitchen QUEtiapine  (SEROQUEL) 25 MG tablet Take 1 tablet (25 mg total) by mouth daily as needed.  Marland Kitchen SPIRIVA HANDIHALER 18 MCG inhalation capsule PLACE 1 C INTO INHALER AND INHALE D.  . [DISCONTINUED] albuterol (PROAIR HFA) 108 (90 Base) MCG/ACT inhaler Inhale 2 puffs into the lungs every 4 (four) hours as needed for wheezing or shortness of breath.  . [DISCONTINUED] ALPRAZolam (XANAX) 0.25 MG tablet Half to one tab po qd prn for anxiety  . [DISCONTINUED] budesonide-formoterol (SYMBICORT) 160-4.5 MCG/ACT inhaler Inhale 1 puff into the lungs 2 (two) times daily.  . [DISCONTINUED] losartan (COZAAR) 25 MG tablet TAKE 1 TABLET(25 MG) BY MOUTH DAILY  . [DISCONTINUED] metoprolol succinate (TOPROL-XL) 50 MG 24 hr tablet TAKE 1 TABLET BY MOUTH DAILY, TAKE WITH OR IMMEDIATELY FOLLOWING A MEAL  . [DISCONTINUED] montelukast (SINGULAIR) 10 MG tablet Take 1 tablet (10 mg total) by mouth at bedtime.  . [DISCONTINUED] QUEtiapine (SEROQUEL) 25 MG tablet Take 1 tablet (25 mg total) by mouth daily as needed.   No facility-administered encounter medications on file as of 12/15/2018.     Surgical History: Past Surgical History:  Procedure Laterality Date  . none      Medical History: Past Medical History:  Diagnosis Date  . Anxiety   . COPD (chronic obstructive pulmonary disease) (HCC)   . HTN (hypertension)   . Pneumonia 09/18/2017  . Tobacco abuse  Family History: Family History  Problem Relation Age of Onset  . Emphysema Brother   . Lung disease Mother   . Heart disease Father     Social History   Socioeconomic History  . Marital status: Single    Spouse name: Not on file  . Number of children: Not on file  . Years of education: Not on file  . Highest education level: Not on file  Occupational History  . Occupation: disabled  Social Needs  . Financial resource strain: Not on file  . Food insecurity:    Worry: Not on file    Inability: Not on file  . Transportation needs:    Medical: Not on file     Non-medical: Not on file  Tobacco Use  . Smoking status: Former Games developer  . Smokeless tobacco: Former Neurosurgeon  . Tobacco comment: quit 6 weeks ago  Substance and Sexual Activity  . Alcohol use: No  . Drug use: No  . Sexual activity: Never  Lifestyle  . Physical activity:    Days per week: Not on file    Minutes per session: Not on file  . Stress: Not on file  Relationships  . Social connections:    Talks on phone: Not on file    Gets together: Not on file    Attends religious service: Not on file    Active member of club or organization: Not on file    Attends meetings of clubs or organizations: Not on file    Relationship status: Not on file  . Intimate partner violence:    Fear of current or ex partner: Not on file    Emotionally abused: Not on file    Physically abused: Not on file    Forced sexual activity: Not on file  Other Topics Concern  . Not on file  Social History Narrative  . Not on file      Review of Systems  Constitutional: Negative for chills, fatigue and unexpected weight change.  HENT: Negative for congestion, rhinorrhea, sneezing and sore throat.   Eyes: Negative for photophobia, pain and redness.  Respiratory: Negative for cough, chest tightness and shortness of breath.   Cardiovascular: Negative for chest pain and palpitations.  Gastrointestinal: Negative for abdominal pain, constipation, diarrhea, nausea and vomiting.  Endocrine: Negative.   Genitourinary: Negative for dysuria and frequency.  Musculoskeletal: Negative for arthralgias, back pain, joint swelling and neck pain.  Skin: Negative for rash.  Allergic/Immunologic: Negative.   Neurological: Negative for tremors and numbness.  Hematological: Negative for adenopathy. Does not bruise/bleed easily.  Psychiatric/Behavioral: Negative for behavioral problems and sleep disturbance. The patient is not nervous/anxious.     Vital Signs: BP 128/78   Pulse 65   Resp 16   Ht  (1.651 m)   Wt 181  lb (82.1 kg)   SpO2 97%   BMI 30.12 kg/m    Physical Exam Vitals signs and nursing note reviewed.  Constitutional:      General: She is not in acute distress.    Appearance: She is well-developed. She is not diaphoretic.  HENT:     Head: Normocephalic and atraumatic.     Mouth/Throat:     Pharynx: No oropharyngeal exudate.  Eyes:     Pupils: Pupils are equal, round, and reactive to light.  Neck:     Musculoskeletal: Normal range of motion and neck supple.     Thyroid: No thyromegaly.     Vascular: No JVD.  Trachea: No tracheal deviation.  Cardiovascular:     Rate and Rhythm: Normal rate and regular rhythm.     Heart sounds: Normal heart sounds. No murmur. No friction rub. No gallop.   Pulmonary:     Effort: Pulmonary effort is normal. No respiratory distress.     Breath sounds: Normal breath sounds. No wheezing or rales.  Chest:     Chest wall: No tenderness.  Abdominal:     Palpations: Abdomen is soft.     Tenderness: There is no abdominal tenderness. There is no guarding.  Musculoskeletal: Normal range of motion.  Lymphadenopathy:     Cervical: No cervical adenopathy.  Skin:    General: Skin is warm and dry.  Neurological:     Mental Status: She is alert and oriented to person, place, and time.     Cranial Nerves: No cranial nerve deficit.  Psychiatric:        Behavior: Behavior normal.        Thought Content: Thought content normal.        Judgment: Judgment normal.    Assessment/Plan: 1. Obstructive chronic bronchitis without exacerbation (HCC) Stable, continue current medications as prescribed.  - albuterol (PROAIR HFA) 108 (90 Base) MCG/ACT inhaler; Inhale 2 puffs into the lungs every 4 (four) hours as needed for wheezing or shortness of breath.  Dispense: 2 Inhaler; Refill: 4 - budesonide-formoterol (SYMBICORT) 160-4.5 MCG/ACT inhaler; Inhale 1 puff into the lungs 2 (two) times daily.  Dispense: 10.2 g; Refill: 2  2. Generalized anxiety disorder Stable,  she has not needed her seroquel in a few weeks.  She is coping well at this time.  Continues to use Xanax.  Refills provided.  - QUEtiapine (SEROQUEL) 25 MG tablet; Take 1 tablet (25 mg total) by mouth daily as needed.  Dispense: 30 tablet; Refill: 1 - ALPRAZolam (XANAX) 0.25 MG tablet; Half to one tab po qd prn for anxiety  Dispense: 20 tablet; Refill: 0  3. Mixed hyperlipidemia Encouraged patient to have labs drawn, and will follow up at next visit.   4. Essential hypertension, benign Stable, continue present management.   - losartan (COZAAR) 25 MG tablet; Take 1 tablet (25 mg total) by mouth daily.  Dispense: 90 tablet; Refill: 1 - metoprolol succinate (TOPROL-XL) 50 MG 24 hr tablet; TAKE 1 TABLET BY MOUTH DAILY, TAKE WITH OR IMMEDIATELY FOLLOWING A MEAL  Dispense: 90 tablet; Refill: 2  5. Multiple allergies Refilled patients Singulair. - montelukast (SINGULAIR) 10 MG tablet; Take 1 tablet (10 mg total) by mouth at bedtime.  Dispense: 90 tablet; Refill: 3  6. Encounter for screening mammogram for breast cancer - MM DIGITAL SCREENING BILATERAL; Future  7. Nicotine dependence, cigarettes, with other nicotine-induced disorders Smoking cessation counseling: 1. Pt acknowledges the risks of long term smoking, she will try to quite smoking. 2. Options for different medications including nicotine products, chewing gum, patch etc, Wellbutrin and Chantix is discussed 3. Goal and date of compete cessation is discussed 4. Total time spent in smoking cessation is 15 min.   General Counseling: Sybel verbalizes understanding of the findings of todays visit and agrees with plan of treatment. I have discussed any further diagnostic evaluation that may be needed or ordered today. We also reviewed her medications today. she has been encouraged to call the office with any questions or concerns that should arise related to todays visit.    Orders Placed This Encounter  Procedures  . MM DIGITAL  SCREENING BILATERAL    Meds  ordered this encounter  Medications  . albuterol (PROAIR HFA) 108 (90 Base) MCG/ACT inhaler    Sig: Inhale 2 puffs into the lungs every 4 (four) hours as needed for wheezing or shortness of breath.    Dispense:  2 Inhaler    Refill:  4  . budesonide-formoterol (SYMBICORT) 160-4.5 MCG/ACT inhaler    Sig: Inhale 1 puff into the lungs 2 (two) times daily.    Dispense:  10.2 g    Refill:  2  . losartan (COZAAR) 25 MG tablet    Sig: Take 1 tablet (25 mg total) by mouth daily.    Dispense:  90 tablet    Refill:  1  . metoprolol succinate (TOPROL-XL) 50 MG 24 hr tablet    Sig: TAKE 1 TABLET BY MOUTH DAILY, TAKE WITH OR IMMEDIATELY FOLLOWING A MEAL    Dispense:  90 tablet    Refill:  2    **Patient requests 90 days supply**  . montelukast (SINGULAIR) 10 MG tablet    Sig: Take 1 tablet (10 mg total) by mouth at bedtime.    Dispense:  90 tablet    Refill:  3  . QUEtiapine (SEROQUEL) 25 MG tablet    Sig: Take 1 tablet (25 mg total) by mouth daily as needed.    Dispense:  30 tablet    Refill:  1  . ALPRAZolam (XANAX) 0.25 MG tablet    Sig: Half to one tab po qd prn for anxiety    Dispense:  20 tablet    Refill:  0    Time spent: 25 Minutes   This patient was seen by Blima Ledger AGNP-C in Collaboration with Dr Lyndon Code as a part of collaborative care agreement     Johnna Acosta AGNP-C Internal medicine

## 2018-12-20 ENCOUNTER — Other Ambulatory Visit: Payer: Self-pay

## 2018-12-20 DIAGNOSIS — J449 Chronic obstructive pulmonary disease, unspecified: Secondary | ICD-10-CM

## 2018-12-20 MED ORDER — ALBUTEROL SULFATE HFA 108 (90 BASE) MCG/ACT IN AERS: 2.0000 | INHALATION_SPRAY | RESPIRATORY_TRACT | 4 refills | Status: DC | PRN

## 2018-12-29 ENCOUNTER — Other Ambulatory Visit: Payer: Self-pay

## 2019-01-05 ENCOUNTER — Encounter: Payer: Self-pay | Admitting: Adult Health

## 2019-01-12 ENCOUNTER — Other Ambulatory Visit: Payer: Self-pay | Admitting: Adult Health

## 2019-01-12 DIAGNOSIS — J449 Chronic obstructive pulmonary disease, unspecified: Secondary | ICD-10-CM

## 2019-01-12 MED ORDER — ROFLUMILAST 500 MCG PO TABS: 500.0000 ug | ORAL_TABLET | Freq: Every day | ORAL | 3 refills | Status: DC

## 2019-02-06 ENCOUNTER — Other Ambulatory Visit: Payer: Self-pay | Admitting: Adult Health

## 2019-02-06 DIAGNOSIS — F411 Generalized anxiety disorder: Secondary | ICD-10-CM

## 2019-03-04 ENCOUNTER — Other Ambulatory Visit: Payer: Self-pay | Admitting: Adult Health

## 2019-03-04 DIAGNOSIS — I10 Essential (primary) hypertension: Secondary | ICD-10-CM

## 2019-03-11 ENCOUNTER — Other Ambulatory Visit: Payer: Self-pay | Admitting: Adult Health

## 2019-03-11 ENCOUNTER — Other Ambulatory Visit: Payer: Self-pay | Admitting: Internal Medicine

## 2019-03-11 DIAGNOSIS — Z889 Allergy status to unspecified drugs, medicaments and biological substances status: Secondary | ICD-10-CM

## 2019-03-13 ENCOUNTER — Other Ambulatory Visit: Payer: Self-pay | Admitting: Adult Health

## 2019-03-13 ENCOUNTER — Other Ambulatory Visit: Payer: Self-pay | Admitting: Internal Medicine

## 2019-03-13 DIAGNOSIS — J449 Chronic obstructive pulmonary disease, unspecified: Secondary | ICD-10-CM

## 2019-03-24 ENCOUNTER — Other Ambulatory Visit: Payer: Self-pay

## 2019-03-24 ENCOUNTER — Other Ambulatory Visit: Payer: Self-pay | Admitting: Adult Health

## 2019-03-24 ENCOUNTER — Encounter: Payer: Self-pay | Admitting: Adult Health

## 2019-03-24 ENCOUNTER — Ambulatory Visit (INDEPENDENT_AMBULATORY_CARE_PROVIDER_SITE_OTHER): Payer: Medicare Other | Admitting: Adult Health

## 2019-03-24 VITALS — BP 110/80 | HR 61 | Resp 16 | Ht 65.0 in | Wt 188.0 lb

## 2019-03-24 DIAGNOSIS — F411 Generalized anxiety disorder: Secondary | ICD-10-CM

## 2019-03-24 DIAGNOSIS — I10 Essential (primary) hypertension: Secondary | ICD-10-CM | POA: Diagnosis not present

## 2019-03-24 DIAGNOSIS — Z79899 Other long term (current) drug therapy: Secondary | ICD-10-CM

## 2019-03-24 DIAGNOSIS — J449 Chronic obstructive pulmonary disease, unspecified: Secondary | ICD-10-CM

## 2019-03-24 DIAGNOSIS — F17218 Nicotine dependence, cigarettes, with other nicotine-induced disorders: Secondary | ICD-10-CM

## 2019-03-24 LAB — POCT URINE DRUG SCREEN
POC Amphetamine UR: NOT DETECTED
POC BENZODIAZEPINES UR: NOT DETECTED
POC Barbiturate UR: NOT DETECTED
POC Cocaine UR: NOT DETECTED
POC Marijuana UR: NOT DETECTED
POC Methadone UR: NOT DETECTED
POC Methamphetamine UR: NOT DETECTED
POC Opiate Ur: NOT DETECTED
POC Oxycodone UR: NOT DETECTED
POC PHENCYCLIDINE UR: NOT DETECTED
POC TRICYCLICS UR: NOT DETECTED

## 2019-03-24 MED ORDER — ALPRAZOLAM 0.25 MG PO TABS: ORAL_TABLET | ORAL | 0 refills | Status: DC

## 2019-03-24 MED ORDER — ALBUTEROL SULFATE HFA 108 (90 BASE) MCG/ACT IN AERS: 1.0000 | INHALATION_SPRAY | RESPIRATORY_TRACT | 3 refills | Status: DC | PRN

## 2019-03-24 NOTE — Patient Instructions (Signed)
Living With Anxiety    After being diagnosed with an anxiety disorder, you may be relieved to know why you have felt or behaved a certain way. It is natural to also feel overwhelmed about the treatment ahead and what it will mean for your life. With care and support, you can manage this condition and recover from it.  How to cope with anxiety  Dealing with stress  Stress is your body’s reaction to life changes and events, both good and bad. Stress can last just a few hours or it can be ongoing. Stress can play a major role in anxiety, so it is important to learn both how to cope with stress and how to think about it differently.  Talk with your health care provider or a counselor to learn more about stress reduction. He or she may suggest some stress reduction techniques, such as:  · Music therapy. This can include creating or listening to music that you enjoy and that inspires you.  · Mindfulness-based meditation. This involves being aware of your normal breaths, rather than trying to control your breathing. It can be done while sitting or walking.  · Centering prayer. This is a kind of meditation that involves focusing on a word, phrase, or sacred image that is meaningful to you and that brings you peace.  · Deep breathing. To do this, expand your stomach and inhale slowly through your nose. Hold your breath for 3-5 seconds. Then exhale slowly, allowing your stomach muscles to relax.  · Self-talk. This is a skill where you identify thought patterns that lead to anxiety reactions and correct those thoughts.  · Muscle relaxation. This involves tensing muscles then relaxing them.  Choose a stress reduction technique that fits your lifestyle and personality. Stress reduction techniques take time and practice. Set aside 5-15 minutes a day to do them. Therapists can offer training in these techniques. The training may be covered by some insurance plans. Other things you can do to manage stress include:  · Keeping a  stress diary. This can help you learn what triggers your stress and ways to control your response.  · Thinking about how you respond to certain situations. You may not be able to control everything, but you can control your reaction.  · Making time for activities that help you relax, and not feeling guilty about spending your time in this way.  Therapy combined with coping and stress-reduction skills provides the best chance for successful treatment.  Medicines  Medicines can help ease symptoms. Medicines for anxiety include:  · Anti-anxiety drugs.  · Antidepressants.  · Beta-blockers.  Medicines may be used as the main treatment for anxiety disorder, along with therapy, or if other treatments are not working. Medicines should be prescribed by a health care provider.  Relationships  Relationships can play a big part in helping you recover. Try to spend more time connecting with trusted friends and family members. Consider going to couples counseling, taking family education classes, or going to family therapy. Therapy can help you and others better understand the condition.  How to recognize changes in your condition  Everyone has a different response to treatment for anxiety. Recovery from anxiety happens when symptoms decrease and stop interfering with your daily activities at home or work. This may mean that you will start to:  · Have better concentration and focus.  · Sleep better.  · Be less irritable.  · Have more energy.  · Have improved memory.  It is   important to recognize when your condition is getting worse. Contact your health care provider if your symptoms interfere with home or work and you do not feel like your condition is improving.  Where to find help and support:  You can get help and support from these sources:  · Self-help groups.  · Online and community organizations.  · A trusted spiritual leader.  · Couples counseling.  · Family education classes.  · Family therapy.  Follow these instructions  at home:  · Eat a healthy diet that includes plenty of vegetables, fruits, whole grains, low-fat dairy products, and lean protein. Do not eat a lot of foods that are high in solid fats, added sugars, or salt.  · Exercise. Most adults should do the following:  ? Exercise for at least 150 minutes each week. The exercise should increase your heart rate and make you sweat (moderate-intensity exercise).  ? Strengthening exercises at least twice a week.  · Cut down on caffeine, tobacco, alcohol, and other potentially harmful substances.  · Get the right amount and quality of sleep. Most adults need 7-9 hours of sleep each night.  · Make choices that simplify your life.  · Take over-the-counter and prescription medicines only as told by your health care provider.  · Avoid caffeine, alcohol, and certain over-the-counter cold medicines. These may make you feel worse. Ask your pharmacist which medicines to avoid.  · Keep all follow-up visits as told by your health care provider. This is important.  Questions to ask your health care provider  · Would I benefit from therapy?  · How often should I follow up with a health care provider?  · How long do I need to take medicine?  · Are there any long-term side effects of my medicine?  · Are there any alternatives to taking medicine?  Contact a health care provider if:  · You have a hard time staying focused or finishing daily tasks.  · You spend many hours a day feeling worried about everyday life.  · You become exhausted by worry.  · You start to have headaches, feel tense, or have nausea.  · You urinate more than normal.  · You have diarrhea.  Get help right away if:  · You have a racing heart and shortness of breath.  · You have thoughts of hurting yourself or others.  If you ever feel like you may hurt yourself or others, or have thoughts about taking your own life, get help right away. You can go to your nearest emergency department or call:  · Your local emergency services  (911 in the [place]  · A suicide crisis helpline, such as the National Suicide Prevention Lifeline at 1-800-273-8255. This is open 24-hours a day.  Summary  · Taking steps to deal with stress can help calm you.  · Medicines cannot cure anxiety disorders, but they can help ease symptoms.  · Family, friends, and partners can play a big part in helping you recover from an anxiety disorder.  This information is not intended to replace advice given to you by your health care provider. Make sure you discuss any questions you have with your health care provider.  Document Released: 09/16/2016 Document Revised: 09/16/2016 Document Reviewed: 09/16/2016  Elsevier Interactive Patient Education © 2019 [employer]vier Inc.

## 2019-03-24 NOTE — Progress Notes (Signed)
[facility] [address] [place], [place] [id number]  Internal MEDICINE  Office Visit Note  Patient Name: Alicia Rivera  [id number] 29, 2057  [id number]  Date of Service: 03/24/2019  Chief Complaint  Patient presents with  . Anxiety    3 month follow up, unable to do labs and mammogram due to virus     HPI  Pt is here to review labs, however due to COVID-19 she has been unwilling to leave her house to get her labs done or get her mammogram. She reports her anxiety and stress have been increased due to staying at home, and watching the news about covid.  She is requesting a refill on her xanax.  She was last given 20 tablets 3 months ago.  She is also reporting some chest congestion, and mild cough.  She denies fever, sob or other symptoms.    Current Medication: Outpatient Encounter Medications as of 03/24/2019  Medication Sig  . albuterol (PROAIR HFA) 108 (90 Base) MCG/ACT inhaler Inhale 1-2 puffs into the lungs every 4 (four) hours as needed for wheezing or shortness of breath.  . ALPRAZolam (XANAX) 0.25 MG tablet Half to one tab po qd prn for anxiety  . aspirin EC 81 MG tablet Take 81 mg by mouth daily.  . cetirizine (ZYRTEC) 10 MG tablet TAKE 1 TABLET(10 MG) BY MOUTH DAILY  . DULoxetine (CYMBALTA) 30 MG capsule Take 1 capsule (30 mg total) by mouth daily.  Marland Kitchen. ipratropium-albuterol (DUONEB) 0.5-2.5 (3) MG/3ML SOLN USE 3 ML VIA NEBULIZER EVERY 6 HOURS AS NEEDED diag j44.1  . losartan (COZAAR) 25 MG tablet Take 1 tablet (25 mg total) by mouth daily.  . metoprolol succinate (TOPROL-XL) 50 MG 24 hr tablet TAKE 1 TABLET BY MOUTH DAILY, TAKE WITH OR IMMEDIATELY FOLLOWING A MEAL  . montelukast (SINGULAIR) 10 MG tablet TAKE 1 TABLET(10 MG) BY MOUTH AT BEDTIME  . QUEtiapine (SEROQUEL) 25 MG tablet TAKE 1 TABLET(25 MG) BY MOUTH DAILY AS NEEDED  . roflumilast (DALIRESP) 500 MCG TABS tablet Take 1 tablet (500 mcg total) by mouth daily.  Marland Kitchen. SPIRIVA HANDIHALER 18 MCG inhalation capsule PLACE  1 C INTO INHALER AND INHALE D.  . SYMBICORT 160-4.5 MCG/ACT inhaler INHALE 1 PUFF BY MOUTH TWICE DAILY  . [DISCONTINUED] albuterol (PROAIR HFA) 108 (90 Base) MCG/ACT inhaler Inhale 2 puffs into the lungs every 6 (six) hours as needed for wheezing or shortness of breath.  . [DISCONTINUED] albuterol (VENTOLIN HFA) 108 (90 Base) MCG/ACT inhaler INHALE 2 PUFFS INTO THE LUNGS EVERY 4 HOURS AS NEEDED FOR WHEEZING OR SHORTNESS OF BREATH (Patient not taking: Reported on 03/24/2019)   No facility-administered encounter medications on file as of 03/24/2019.     Surgical History: Past Surgical History:  Procedure Laterality Date  . none      Medical History: Past Medical History:  Diagnosis Date  . Anxiety   . COPD (chronic obstructive pulmonary disease) (HCC)   . HTN (hypertension)   . Pneumonia 09/18/2017  . Tobacco abuse     Family History: Family History  Problem Relation Age of Onset  . Emphysema Brother   . Lung disease Mother   . Heart disease Father     Social History   Socioeconomic History  . Marital status: Single    Spouse name: Not on file  . Number of children: Not on file  . Years of education: Not on file  . Highest education level: Not on file  Occupational History  . Occupation: disabled  Social Needs  . Financial resource strain: Not on file  . Food insecurity    Worry: Not on file    Inability: Not on file  . Transportation needs    Medical: Not on file    Non-medical: Not on file  Tobacco Use  . Smoking status: Former Research scientist (life sciences)  . Smokeless tobacco: Former Systems developer  . Tobacco comment: quit 6 weeks ago  Substance and Sexual Activity  . Alcohol use: No  . Drug use: No  . Sexual activity: Never  Lifestyle  . Physical activity    Days per week: Not on file    Minutes per session: Not on file  . Stress: Not on file  Relationships  . Social Herbalist on phone: Not on file    Gets together: Not on file    Attends religious service: Not on file     Active member of club or organization: Not on file    Attends meetings of clubs or organizations: Not on file    Relationship status: Not on file  . Intimate partner violence    Fear of current or ex partner: Not on file    Emotionally abused: Not on file    Physically abused: Not on file    Forced sexual activity: Not on file  Other Topics Concern  . Not on file  Social History Narrative  . Not on file      Review of Systems  Constitutional: Negative for chills, fatigue and unexpected weight change.  HENT: Negative for congestion, rhinorrhea, sneezing and sore throat.   Eyes: Negative for photophobia, pain and redness.  Respiratory: Positive for cough and chest tightness. Negative for shortness of breath.   Cardiovascular: Negative for chest pain and palpitations.  Gastrointestinal: Negative for abdominal pain, constipation, diarrhea, nausea and vomiting.  Endocrine: Negative.   Genitourinary: Negative for dysuria and frequency.  Musculoskeletal: Negative for arthralgias, back pain, joint swelling and neck pain.  Skin: Negative for rash.  Allergic/Immunologic: Negative.   Neurological: Negative for tremors and numbness.  Hematological: Negative for adenopathy. Does not bruise/bleed easily.  Psychiatric/Behavioral: Negative for behavioral problems and sleep disturbance. The patient is not nervous/anxious.     Vital Signs: BP 110/80   Pulse 61   Resp 16   Ht 5\' 5"  (1.651 m)   Wt 188 lb (85.3 kg)   SpO2 92%   BMI 31.28 kg/m    Physical Exam Vitals signs and nursing note reviewed.  Constitutional:      General: She is not in acute distress.    Appearance: She is well-developed. She is not diaphoretic.  HENT:     Head: Normocephalic and atraumatic.     Mouth/Throat:     Pharynx: No oropharyngeal exudate.  Eyes:     Pupils: Pupils are equal, round, and reactive to light.  Neck:     Musculoskeletal: Normal range of motion and neck supple.     Thyroid: No  thyromegaly.     Vascular: No JVD.     Trachea: No tracheal deviation.  Cardiovascular:     Rate and Rhythm: Normal rate and regular rhythm.     Heart sounds: Normal heart sounds. No murmur. No friction rub. No gallop.   Pulmonary:     Effort: Pulmonary effort is normal. No respiratory distress.     Breath sounds: Normal breath sounds. No wheezing or rales.  Chest:     Chest wall: No tenderness.  Abdominal:  Palpations: Abdomen is soft.     Tenderness: There is no abdominal tenderness. There is no guarding.  Musculoskeletal: Normal range of motion.  Lymphadenopathy:     Cervical: No cervical adenopathy.  Skin:    General: Skin is warm and dry.  Neurological:     Mental Status: She is alert and oriented to person, place, and time.     Cranial Nerves: No cranial nerve deficit.  Psychiatric:        Behavior: Behavior normal.        Thought Content: Thought content normal.        Judgment: Judgment normal.    Assessment/Plan: 1. Generalized anxiety disorder Refilled patients xanax.   Reviewed risks and possible side effects associated with taking opiates, benzodiazepines and other CNS depressants. Combination of these could cause dizziness and drowsiness. Advised patient not to drive or operate machinery when taking these medications, as patient's and other's life can be at risk and will have consequences. Patient verbalized understanding in this matter. Dependence and abuse for these drugs will be monitored closely. A Controlled substance policy and procedure is on file which allows [facility] to order a urine drug screen test at any visit. Patient understands and agrees with the plan - ALPRAZolam (XANAX) 0.25 MG tablet; Half to one tab po qd prn for anxiety  Dispense: 20 tablet; Refill: 0  2. Essential hypertension, benign Stable, continue present management.   3. Obstructive chronic bronchitis without exacerbation (HCC) Gave patient sample of spiriva while she is  waiting to be able to get her RX refilled.  4. Nicotine dependence, cigarettes, with other nicotine-induced disorders Pt reports smoking one cigarette every 4 days. Smoking cessation counseling: 1. Pt acknowledges the risks of long term smoking, she will try to quite smoking. 2. Options for different medications including nicotine products, chewing gum, patch etc, Wellbutrin and Chantix is discussed 3. Goal and date of compete cessation is discussed 4. Total time spent in smoking cessation is 15 min.   5. High risk medication use Negative - POCT Urine Drug Screen  General Counseling: Lasha verbalizes understanding of the findings of todays visit and agrees with plan of treatment. I have discussed any further diagnostic evaluation that may be needed or ordered today. We also reviewed her medications today. she has been encouraged to call the office with any questions or concerns that should arise related to todays visit.    Orders Placed This Encounter  Procedures  . POCT Urine Drug Screen    No orders of the defined types were placed in this encounter.   Time spent: 20 Minutes   This patient was seen by Blima LedgerAdam Lincy Belles AGNP-C in Collaboration with Dr Lyndon CodeFozia M Khan as a part of collaborative care agreement     Johnna AcostaAdam J. Rylinn Linzy AGNP-C Internal medicine

## 2019-05-05 ENCOUNTER — Encounter: Payer: Self-pay | Admitting: Internal Medicine

## 2019-05-05 ENCOUNTER — Other Ambulatory Visit: Payer: Self-pay

## 2019-05-05 ENCOUNTER — Ambulatory Visit (INDEPENDENT_AMBULATORY_CARE_PROVIDER_SITE_OTHER): Payer: Medicare Other | Admitting: Internal Medicine

## 2019-05-05 VITALS — BP 130/68 | HR 70 | Temp 98.7°F | Ht 65.0 in | Wt 180.0 lb

## 2019-05-05 DIAGNOSIS — F17218 Nicotine dependence, cigarettes, with other nicotine-induced disorders: Secondary | ICD-10-CM

## 2019-05-05 DIAGNOSIS — J40 Bronchitis, not specified as acute or chronic: Secondary | ICD-10-CM | POA: Diagnosis not present

## 2019-05-05 DIAGNOSIS — J449 Chronic obstructive pulmonary disease, unspecified: Secondary | ICD-10-CM

## 2019-05-05 MED ORDER — AZITHROMYCIN 250 MG PO TABS: ORAL_TABLET | ORAL | 0 refills | Status: DC

## 2019-05-05 MED ORDER — PREDNISONE 10 MG PO TABS: ORAL_TABLET | ORAL | 0 refills | Status: DC

## 2019-05-05 MED ORDER — ALBUTEROL SULFATE HFA 108 (90 BASE) MCG/ACT IN AERS: 2.0000 | INHALATION_SPRAY | RESPIRATORY_TRACT | 3 refills | Status: DC

## 2019-05-05 NOTE — Progress Notes (Signed)
[facility] [address] [place], [place] [id number]  Internal MEDICINE  Telephone Visit  Patient Name: Alicia Rivera  [id number]  [id number]  Date of Service: 05/05/2019  I connected with the patient at 1210 by telephone and verified the patients identity using two identifiers.   I discussed the limitations, risks, security and privacy concerns of performing an evaluation and management service by telephone and the availability of in person appointments. I also discussed with the patient that there may be a patient responsible charge related to the service.  The patient expressed understanding and agrees to proceed.    Chief Complaint  Patient presents with  . Telephone Assessment  . Telephone Screen  . Wheezing  . Cough    HPI  Pt seen via video.  She reports 2 days of feeling run down.  Her oxygen level has been "low" for here at 92-93.  She reports a mildly productive cough.  Denies fever, chest pain, or hemoptysis.    Current Medication: Outpatient Encounter Medications as of 05/05/2019  Medication Sig  . albuterol (PROAIR HFA) 108 (90 Base) MCG/ACT inhaler Inhale 1-2 puffs into the lungs every 4 (four) hours as needed for wheezing or shortness of breath.  . ALPRAZolam (XANAX) 0.25 MG tablet Half to one tab po qd prn for anxiety  . aspirin EC 81 MG tablet Take 81 mg by mouth daily.  . cetirizine (ZYRTEC) 10 MG tablet TAKE 1 TABLET(10 MG) BY MOUTH DAILY  . DULoxetine (CYMBALTA) 30 MG capsule Take 1 capsule (30 mg total) by mouth daily.  Marland Kitchen. ipratropium-albuterol (DUONEB) 0.5-2.5 (3) MG/3ML SOLN USE 3 ML VIA NEBULIZER EVERY 6 HOURS AS NEEDED diag j44.1  . losartan (COZAAR) 25 MG tablet Take 1 tablet (25 mg total) by mouth daily.  . metoprolol succinate (TOPROL-XL) 50 MG 24 hr tablet TAKE 1 TABLET BY MOUTH DAILY, TAKE WITH OR IMMEDIATELY FOLLOWING A MEAL  . montelukast (SINGULAIR) 10 MG tablet TAKE 1 TABLET(10 MG) BY MOUTH AT BEDTIME  . QUEtiapine (SEROQUEL) 25 MG  tablet TAKE 1 TABLET(25 MG) BY MOUTH DAILY AS NEEDED  . roflumilast (DALIRESP) 500 MCG TABS tablet Take 1 tablet (500 mcg total) by mouth daily.  Marland Kitchen. SPIRIVA HANDIHALER 18 MCG inhalation capsule PLACE 1 C INTO INHALER AND INHALE D.  . SYMBICORT 160-4.5 MCG/ACT inhaler INHALE 1 PUFF BY MOUTH TWICE DAILY   No facility-administered encounter medications on file as of 05/05/2019.     Surgical History: Past Surgical History:  Procedure Laterality Date  . none      Medical History: Past Medical History:  Diagnosis Date  . Anxiety   . COPD (chronic obstructive pulmonary disease) (HCC)   . HTN (hypertension)   . Pneumonia 09/18/2017  . Tobacco abuse     Family History: Family History  Problem Relation Age of Onset  . Emphysema Brother   . Lung disease Mother   . Heart disease Father     Social History   Socioeconomic History  . Marital status: Single    Spouse name: Not on file  . Number of children: Not on file  . Years of education: Not on file  . Highest education level: Not on file  Occupational History  . Occupation: disabled  Social Needs  . Financial resource strain: Not on file  . Food insecurity    Worry: Not on file    Inability: Not on file  . Transportation needs    Medical: Not on file  Non-medical: Not on file  Tobacco Use  . Smoking status: Former Research scientist (life sciences)  . Smokeless tobacco: Former Systems developer  . Tobacco comment: quit 6 weeks ago  Substance and Sexual Activity  . Alcohol use: No  . Drug use: No  . Sexual activity: Never  Lifestyle  . Physical activity    Days per week: Not on file    Minutes per session: Not on file  . Stress: Not on file  Relationships  . Social Herbalist on phone: Not on file    Gets together: Not on file    Attends religious service: Not on file    Active member of club or organization: Not on file    Attends meetings of clubs or organizations: Not on file    Relationship status: Not on file  . Intimate partner  violence    Fear of current or ex partner: Not on file    Emotionally abused: Not on file    Physically abused: Not on file    Forced sexual activity: Not on file  Other Topics Concern  . Not on file  Social History Narrative  . Not on file      Review of Systems  Vital Signs: BP 130/68   Pulse 70   Temp 98.7 F (37.1 C)   Ht 5\' 5"  (1.651 m)   Wt 180 lb (81.6 kg)   SpO2 93% Comment: ROOM AIR  BMI 29.95 kg/m    Observation/Objective:  Speaking in full sentences, NAD noted.   Assessment/Plan: 1. Bronchitis Advised patient to take entire course of antibiotics as prescribed with food. Pt should return to clinic in 7-10 days if symptoms fail to improve or new symptoms develop.  - predniSONE (DELTASONE) 10 MG tablet; Use per dose pack  Dispense: 21 tablet; Refill: 0 - azithromycin (ZITHROMAX) 250 MG tablet; Take as directed.  Dispense: 6 tablet; Refill: 0 - albuterol (PROAIR HFA) 108 (90 Base) MCG/ACT inhaler; Inhale 2 puffs into the lungs every 4 (four) hours.  Dispense: 36 g; Refill: 3  2. Obstructive chronic bronchitis without exacerbation ([place]) Take medications as directed.   3. Nicotine dependence, cigarettes, with other nicotine-induced disorders Smoking cessation counseling: 1. Pt acknowledges the risks of long term smoking, she will try to quite smoking. 2. Options for different medications including nicotine products, chewing gum, patch etc, Wellbutrin and Chantix is discussed 3. Goal and date of compete cessation is discussed 4. Total time spent in smoking cessation is 15 min.   General Counseling: Tanea verbalizes understanding of the findings of today's phone visit and agrees with plan of treatment. I have discussed any further diagnostic evaluation that may be needed or ordered today. We also reviewed her medications today. she has been encouraged to call the office with any questions or concerns that should arise related to todays visit.    No orders of  the defined types were placed in this encounter.   No orders of the defined types were placed in this encounter.   Time spent: [place] [facility] Internal medicine

## 2019-05-08 ENCOUNTER — Other Ambulatory Visit: Payer: Self-pay | Admitting: Internal Medicine

## 2019-05-08 DIAGNOSIS — F411 Generalized anxiety disorder: Secondary | ICD-10-CM

## 2019-06-03 ENCOUNTER — Ambulatory Visit (INDEPENDENT_AMBULATORY_CARE_PROVIDER_SITE_OTHER): Payer: Medicare Other | Admitting: Nurse Practitioner

## 2019-06-03 ENCOUNTER — Encounter: Payer: Self-pay | Admitting: Nurse Practitioner

## 2019-06-03 VITALS — BP 130/62 | HR 80 | Temp 98.6°F | Resp 16 | Ht 65.0 in | Wt 184.0 lb

## 2019-06-03 DIAGNOSIS — J449 Chronic obstructive pulmonary disease, unspecified: Secondary | ICD-10-CM

## 2019-06-03 DIAGNOSIS — J440 Chronic obstructive pulmonary disease with acute lower respiratory infection: Secondary | ICD-10-CM | POA: Insufficient documentation

## 2019-06-03 DIAGNOSIS — J40 Bronchitis, not specified as acute or chronic: Secondary | ICD-10-CM

## 2019-06-03 DIAGNOSIS — F411 Generalized anxiety disorder: Secondary | ICD-10-CM | POA: Diagnosis not present

## 2019-06-03 MED ORDER — ALPRAZOLAM 0.25 MG PO TABS: ORAL_TABLET | ORAL | 0 refills | Status: DC

## 2019-06-03 MED ORDER — PREDNISONE 10 MG PO TABS: ORAL_TABLET | ORAL | 0 refills | Status: DC

## 2019-06-03 MED ORDER — AZITHROMYCIN 250 MG PO TABS: ORAL_TABLET | ORAL | 0 refills | Status: DC

## 2019-06-03 NOTE — Progress Notes (Signed)
[facility] [address] [place], [place] [address]  Internal MEDICINE  Telephone Visit  Patient Name: Alicia Rivera  [id number]  [id number]  Date of Service: 06/03/2019  I connected with the patient at 12:35pm by telephone and verified the patients identity using two identifiers.   I discussed the limitations, risks, security and privacy concerns of performing an evaluation and management service by telephone and the availability of in person appointments. I also discussed with the patient that there may be a patient responsible charge related to the service.  The patient expressed understanding and agrees to proceed.    Chief Complaint  Patient presents with  . Telephone Screen  . Telephone Assessment  . Sinusitis  . Cough  . Wheezing    The patient has been contacted via telephone for follow up visit due to concerns for spread of novel coronavirus. The patient is having wheezing, shortness of breath, and congested. She has a cough, but cannot get anything up. Symptoms have been going on for about two days. She states that she has been using nebulizer and this has not been helping. She was treated for bronchitis at the end of July. She completed round of z-pack and prednisone. Felt munc better after treatment. Symptoms started back after three weeks or so.       Current Medication: Outpatient Encounter Medications as of 06/03/2019  Medication Sig  . albuterol (PROAIR HFA) 108 (90 Base) MCG/ACT inhaler Inhale 2 puffs into the lungs every 4 (four) hours.  . ALPRAZolam (XANAX) 0.25 MG tablet Half to one tab po qd prn for anxiety  . aspirin EC 81 MG tablet Take 81 mg by mouth daily.  Marland Kitchen. azithromycin (ZITHROMAX) 250 MG tablet Take as directed.  . cetirizine (ZYRTEC) 10 MG tablet TAKE 1 TABLET(10 MG) BY MOUTH DAILY  . DULoxetine (CYMBALTA) 30 MG capsule Take 1 capsule (30 mg total) by mouth daily.  Marland Kitchen. ipratropium-albuterol (DUONEB) 0.5-2.5 (3) MG/3ML SOLN USE 3 ML VIA  NEBULIZER EVERY 6 HOURS AS NEEDED diag j44.1  . losartan (COZAAR) 25 MG tablet Take 1 tablet (25 mg total) by mouth daily.  . metoprolol succinate (TOPROL-XL) 50 MG 24 hr tablet TAKE 1 TABLET BY MOUTH DAILY, TAKE WITH OR IMMEDIATELY FOLLOWING A MEAL  . montelukast (SINGULAIR) 10 MG tablet TAKE 1 TABLET(10 MG) BY MOUTH AT BEDTIME  . predniSONE (DELTASONE) 10 MG tablet Use per dose pack  . QUEtiapine (SEROQUEL) 25 MG tablet TAKE 1 TABLET(25 MG) BY MOUTH DAILY AS NEEDED  . roflumilast (DALIRESP) 500 MCG TABS tablet Take 1 tablet (500 mcg total) by mouth daily.  Marland Kitchen. SPIRIVA HANDIHALER 18 MCG inhalation capsule PLACE 1 C INTO INHALER AND INHALE D.  . SYMBICORT 160-4.5 MCG/ACT inhaler INHALE 1 PUFF BY MOUTH TWICE DAILY  . [DISCONTINUED] ALPRAZolam (XANAX) 0.25 MG tablet Half to one tab po qd prn for anxiety  . [DISCONTINUED] azithromycin (ZITHROMAX) 250 MG tablet Take as directed.  . [DISCONTINUED] predniSONE (DELTASONE) 10 MG tablet Use per dose pack   No facility-administered encounter medications on file as of 06/03/2019.     Surgical History: Past Surgical History:  Procedure Laterality Date  . none      Medical History: Past Medical History:  Diagnosis Date  . Anxiety   . COPD (chronic obstructive pulmonary disease) (HCC)   . HTN (hypertension)   . Pneumonia 09/18/2017  . Tobacco abuse     Family History: Family History  Problem Relation Age of Onset  . Emphysema  Brother   . Lung disease Mother   . Heart disease Father     Social History   Socioeconomic History  . Marital status: Single    Spouse name: Not on file  . Number of children: Not on file  . Years of education: Not on file  . Highest education level: Not on file  Occupational History  . Occupation: disabled  Social Needs  . Financial resource strain: Not on file  . Food insecurity    Worry: Not on file    Inability: Not on file  . Transportation needs    Medical: Not on file    Non-medical: Not on file   Tobacco Use  . Smoking status: Former Games developer  . Smokeless tobacco: Former Neurosurgeon  . Tobacco comment: quit 6 weeks ago  Substance and Sexual Activity  . Alcohol use: No  . Drug use: No  . Sexual activity: Never  Lifestyle  . Physical activity    Days per week: Not on file    Minutes per session: Not on file  . Stress: Not on file  Relationships  . Social Musician on phone: Not on file    Gets together: Not on file    Attends religious service: Not on file    Active member of club or organization: Not on file    Attends meetings of clubs or organizations: Not on file    Relationship status: Not on file  . Intimate partner violence    Fear of current or ex partner: Not on file    Emotionally abused: Not on file    Physically abused: Not on file    Forced sexual activity: Not on file  Other Topics Concern  . Not on file  Social History Narrative  . Not on file      Review of Systems  Constitutional: Positive for fatigue. Negative for chills, fever and unexpected weight change.  HENT: Positive for congestion. Negative for rhinorrhea, sneezing and sore throat.   Respiratory: Positive for cough, chest tightness and wheezing. Negative for shortness of breath.   Cardiovascular: Negative for chest pain and palpitations.  Gastrointestinal: Negative for abdominal pain, constipation, diarrhea, nausea and vomiting.  Musculoskeletal: Negative for arthralgias, back pain, joint swelling and neck pain.  Skin: Negative for rash.  Allergic/Immunologic: Positive for environmental allergies.  Neurological: Negative for tremors and numbness.  Hematological: Negative for adenopathy. Does not bruise/bleed easily.  Psychiatric/Behavioral: Negative for behavioral problems and sleep disturbance. The patient is nervous/anxious.        Patient states that she gets anxious when she has wheezing and cough. Gets worse when she takes prednisone.     Today's Vitals   06/03/19 1106  BP:  130/62  Pulse: 80  Resp: 16  Temp: 98.6 F (37 C)  SpO2: 91%  Weight: 184 lb (83.5 kg)  Height: 5\' 5"  (1.651 m)   Body mass index is 30.62 kg/m.  Observation/Objective:   The patient is alert and oriented. She is pleasant and answers all questions appropriately. Breathing is non-labored. She is in no acute distress at this time.  She sounds congested and worried.    Assessment/Plan: 1. Bronchitis Repeat treatment with z-pack and prednisone taper. Take both as prescribed. Rest. Use inhalers and respiratory medications as needed and as prescribed.  - azithromycin (ZITHROMAX) 250 MG tablet; Take as directed.  Dispense: 6 tablet; Refill: 0 - predniSONE (DELTASONE) 10 MG tablet; Use per dose pack  Dispense: 21 tablet;  Refill: 0  2. Obstructive chronic bronchitis without exacerbation ([place]) Continue inhalers and respiratory medications as prescribed   3. Generalized anxiety disorder May take alprazolam 0.25mg  daily if needed for acute anxiety.  - ALPRAZolam (XANAX) 0.25 MG tablet; Half to one tab po qd prn for anxiety  Dispense: 20 tablet; Refill: 0  General Counseling: Alicia Rivera verbalizes understanding of the findings of today's phone visit and agrees with plan of treatment. I have discussed any further diagnostic evaluation that may be needed or ordered today. We also reviewed her medications today. she has been encouraged to call the office with any questions or concerns that should arise related to todays visit.  This patient was seen by [place] with Dr Lavera Guise as a part of collaborative care agreement  Meds ordered this encounter  Medications  . azithromycin (ZITHROMAX) 250 MG tablet    Sig: Take as directed.    Dispense:  6 tablet    Refill:  0    Order Specific Question:   Supervising Provider    Answer:   Lavera Guise [[id number]]  . predniSONE (DELTASONE) 10 MG tablet    Sig: Use per dose pack    Dispense:  21 tablet    Refill:  0    Order  Specific Question:   Supervising Provider    Answer:   Lavera Guise [[id number]]  . ALPRAZolam (XANAX) 0.25 MG tablet    Sig: Half to one tab po qd prn for anxiety    Dispense:  20 tablet    Refill:  0    Order Specific Question:   Supervising Provider    Answer:   Lavera Guise [[id number]]    Time spent: 57 Minutes    Dr Lavera Guise Internal medicine

## 2019-06-23 ENCOUNTER — Other Ambulatory Visit: Payer: Self-pay

## 2019-06-23 ENCOUNTER — Ambulatory Visit (INDEPENDENT_AMBULATORY_CARE_PROVIDER_SITE_OTHER): Payer: Medicare Other | Admitting: Adult Health

## 2019-06-23 ENCOUNTER — Encounter: Payer: Self-pay | Admitting: Adult Health

## 2019-06-23 VITALS — BP 116/78 | HR 54 | Resp 16 | Ht 65.0 in | Wt 184.0 lb

## 2019-06-23 DIAGNOSIS — J40 Bronchitis, not specified as acute or chronic: Secondary | ICD-10-CM

## 2019-06-23 DIAGNOSIS — F411 Generalized anxiety disorder: Secondary | ICD-10-CM

## 2019-06-23 DIAGNOSIS — I10 Essential (primary) hypertension: Secondary | ICD-10-CM | POA: Diagnosis not present

## 2019-06-23 DIAGNOSIS — J449 Chronic obstructive pulmonary disease, unspecified: Secondary | ICD-10-CM

## 2019-06-23 DIAGNOSIS — F17218 Nicotine dependence, cigarettes, with other nicotine-induced disorders: Secondary | ICD-10-CM | POA: Diagnosis not present

## 2019-06-23 DIAGNOSIS — R300 Dysuria: Secondary | ICD-10-CM

## 2019-06-23 DIAGNOSIS — Z0001 Encounter for general adult medical examination with abnormal findings: Secondary | ICD-10-CM | POA: Diagnosis not present

## 2019-06-23 DIAGNOSIS — J4489 Other specified chronic obstructive pulmonary disease: Secondary | ICD-10-CM

## 2019-06-23 MED ORDER — SPIRIVA HANDIHALER 18 MCG IN CAPS: ORAL_CAPSULE | RESPIRATORY_TRACT | 4 refills | Status: DC

## 2019-06-23 MED ORDER — LOSARTAN POTASSIUM 25 MG PO TABS: 25.0000 mg | ORAL_TABLET | Freq: Every day | ORAL | 1 refills | Status: DC

## 2019-06-23 MED ORDER — BUDESONIDE-FORMOTEROL FUMARATE 160-4.5 MCG/ACT IN AERO: 1.0000 | INHALATION_SPRAY | Freq: Two times a day (BID) | RESPIRATORY_TRACT | 4 refills | Status: DC

## 2019-06-23 MED ORDER — DALIRESP 500 MCG PO TABS: 500.0000 ug | ORAL_TABLET | Freq: Every day | ORAL | 3 refills | Status: DC

## 2019-06-23 MED ORDER — ALBUTEROL SULFATE HFA 108 (90 BASE) MCG/ACT IN AERS: 2.0000 | INHALATION_SPRAY | RESPIRATORY_TRACT | 3 refills | Status: DC

## 2019-06-23 NOTE — Progress Notes (Signed)
[facility] [address] [place], [place] [address]  Internal MEDICINE  Office Visit Note  Patient Name: Alicia Rivera  [id number]  [id number]  Date of Service: 07/03/2019  Chief Complaint  Patient presents with  . Medical Management of Chronic Issues  . Annual Exam    medicare wellness visit   . Hypertension  . COPD  . Anxiety     HPI Pt is here for routine health maintenance examination.  She is a well appearing 63 yo female  Has no issues to report today. Still taking all medications as prescribed, with no issues to report. She has a history of copd, htn and anxiety.  Overall she is doing well, and denies any issues at this time.   Current Medication: Outpatient Encounter Medications as of 06/23/2019  Medication Sig  . ALPRAZolam (XANAX) 0.25 MG tablet Half to one tab po qd prn for anxiety  . aspirin EC 81 MG tablet Take 81 mg by mouth daily.  . cetirizine (ZYRTEC) 10 MG tablet TAKE 1 TABLET(10 MG) BY MOUTH DAILY  . DULoxetine (CYMBALTA) 30 MG capsule Take 1 capsule (30 mg total) by mouth daily.  Marland Kitchen. ipratropium-albuterol (DUONEB) 0.5-2.5 (3) MG/3ML SOLN USE 3 ML VIA NEBULIZER EVERY 6 HOURS AS NEEDED diag j44.1  . metoprolol succinate (TOPROL-XL) 50 MG 24 hr tablet TAKE 1 TABLET BY MOUTH DAILY, TAKE WITH OR IMMEDIATELY FOLLOWING A MEAL  . montelukast (SINGULAIR) 10 MG tablet TAKE 1 TABLET(10 MG) BY MOUTH AT BEDTIME  . QUEtiapine (SEROQUEL) 25 MG tablet TAKE 1 TABLET(25 MG) BY MOUTH DAILY AS NEEDED  . [DISCONTINUED] albuterol (PROAIR HFA) 108 (90 Base) MCG/ACT inhaler Inhale 2 puffs into the lungs every 4 (four) hours.  . [DISCONTINUED] losartan (COZAAR) 25 MG tablet Take 1 tablet (25 mg total) by mouth daily.  . [DISCONTINUED] roflumilast (DALIRESP) 500 MCG TABS tablet Take 1 tablet (500 mcg total) by mouth daily.  . [DISCONTINUED] SPIRIVA HANDIHALER 18 MCG inhalation capsule PLACE 1 C INTO INHALER AND INHALE D.  . [DISCONTINUED] SYMBICORT 160-4.5 MCG/ACT inhaler  INHALE 1 PUFF BY MOUTH TWICE DAILY  . albuterol (PROAIR HFA) 108 (90 Base) MCG/ACT inhaler Inhale 2 puffs into the lungs every 4 (four) hours.  . budesonide-formoterol (SYMBICORT) 160-4.5 MCG/ACT inhaler Inhale 1 puff into the lungs 2 (two) times daily.  Marland Kitchen. losartan (COZAAR) 25 MG tablet Take 1 tablet (25 mg total) by mouth daily.  . roflumilast (DALIRESP) 500 MCG TABS tablet Take 1 tablet (500 mcg total) by mouth daily.  Marland Kitchen. SPIRIVA HANDIHALER 18 MCG inhalation capsule PLACE 1 C INTO INHALER AND INHALE D.  . [DISCONTINUED] azithromycin (ZITHROMAX) 250 MG tablet Take as directed. (Patient not taking: Reported on 06/23/2019)  . [DISCONTINUED] predniSONE (DELTASONE) 10 MG tablet Use per dose pack (Patient not taking: Reported on 06/23/2019)   No facility-administered encounter medications on file as of 06/23/2019.     Surgical History: Past Surgical History:  Procedure Laterality Date  . none      Medical History: Past Medical History:  Diagnosis Date  . Anxiety   . COPD (chronic obstructive pulmonary disease) (HCC)   . HTN (hypertension)   . Pneumonia 09/18/2017  . Tobacco abuse     Family History: Family History  Problem Relation Age of Onset  . Emphysema Brother   . Lung disease Mother   . Heart disease Father       Review of Systems  Constitutional: Negative for chills, fatigue and unexpected weight change.  HENT:  Negative for congestion, rhinorrhea, sneezing and sore throat.   Eyes: Negative for photophobia, pain and redness.  Respiratory: Negative for cough, chest tightness and shortness of breath.   Cardiovascular: Negative for chest pain and palpitations.  Gastrointestinal: Negative for abdominal pain, constipation, diarrhea, nausea and vomiting.  Endocrine: Negative.   Genitourinary: Negative for dysuria and frequency.  Musculoskeletal: Negative for arthralgias, back pain, joint swelling and neck pain.  Skin: Negative for rash.  Allergic/Immunologic: Negative.    Neurological: Negative for tremors and numbness.  Hematological: Negative for adenopathy. Does not bruise/bleed easily.  Psychiatric/Behavioral: Negative for behavioral problems and sleep disturbance. The patient is not nervous/anxious.      Vital Signs: BP 116/78   Pulse (!) 54   Resp 16   Ht 5\' 5"  (1.651 m)   Wt 184 lb (83.5 kg)   SpO2 98%   BMI 30.62 kg/m    Physical Exam Vitals signs and nursing note reviewed.  Constitutional:      General: She is not in acute distress.    Appearance: She is well-developed. She is not diaphoretic.  HENT:     Head: Normocephalic and atraumatic.     Mouth/Throat:     Pharynx: No oropharyngeal exudate.  Eyes:     Pupils: Pupils are equal, round, and reactive to light.  Neck:     Musculoskeletal: Normal range of motion and neck supple.     Thyroid: No thyromegaly.     Vascular: No JVD.     Trachea: No tracheal deviation.  Cardiovascular:     Rate and Rhythm: Normal rate and regular rhythm.     Heart sounds: Normal heart sounds. No murmur. No friction rub. No gallop.   Pulmonary:     Effort: Pulmonary effort is normal. No respiratory distress.     Breath sounds: Normal breath sounds. No wheezing or rales.  Chest:     Chest wall: No tenderness.     Breasts:        Right: Normal.        Left: Normal.     Comments: Chaperoned by Myself,  Casey Burkitt NP student performed exam.  Abdominal:     Palpations: Abdomen is soft.     Tenderness: There is no abdominal tenderness. There is no guarding.  Musculoskeletal: Normal range of motion.  Lymphadenopathy:     Cervical: No cervical adenopathy.  Skin:    General: Skin is warm and dry.  Neurological:     Mental Status: She is alert and oriented to person, place, and time.     Cranial Nerves: No cranial nerve deficit.  Psychiatric:        Behavior: Behavior normal.        Thought Content: Thought content normal.        Judgment: Judgment normal.      LABS: Recent Results (from  the past 2160 hour(s))  UA/M w/rflx Culture, Routine     Status: Abnormal   Collection Time: 06/23/19  9:29 AM   Specimen: Urine   URINE  Result Value Ref Range   Specific Gravity, UA 1.011 1.005 - 1.030   pH, UA 7.5 5.0 - 7.5   Color, UA Yellow Yellow   Appearance Ur Clear Clear   Leukocytes,UA 1+ (A) Negative   Protein,UA Negative Negative/Trace   Glucose, UA Negative Negative   Ketones, UA Negative Negative   RBC, UA Negative Negative   Bilirubin, UA Negative Negative   Urobilinogen, Ur 0.2 0.2 - 1.0 mg/dL  Nitrite, UA Negative Negative   Microscopic Examination See below:     Comment: Microscopic was indicated and was performed.   Urinalysis Reflex Comment     Comment: This specimen has reflexed to a Urine Culture.  Microscopic Examination     Status: None   Collection Time: 06/23/19  9:29 AM   URINE  Result Value Ref Range   WBC, UA 0-5 0 - 5 /hpf   RBC 0-2 0 - 2 /hpf   Epithelial Cells (non renal) 0-10 0 - 10 /hpf   Casts None seen None seen /lpf   Bacteria, UA None seen None seen/Few  Urine Culture, Reflex     Status: None   Collection Time: 06/23/19  9:29 AM   URINE  Result Value Ref Range   Urine Culture, Routine Final report    Organism ID, Bacteria Comment     Comment: Mixed urogenital flora 50,000-100,000 colony forming units per mL      Assessment/Plan: 1. Encounter for general adult medical examination with abnormal findings Up-to-date on preventative health maintenance.  2. Bronchitis Continue use albuterol as directed.  Currently well controlled. - albuterol (PROAIR HFA) 108 (90 Base) MCG/ACT inhaler; Inhale 2 puffs into the lungs every 4 (four) hours.  Dispense: 36 g; Refill: 3  3. Essential hypertension, benign Stable, continue use losartan as prescribed. - losartan (COZAAR) 25 MG tablet; Take 1 tablet (25 mg total) by mouth daily.  Dispense: 90 tablet; Refill: 1  4. Obstructive chronic bronchitis without exacerbation (HCC) Stable, refilled  medications as follows.  Continue to therapy. - roflumilast (DALIRESP) 500 MCG TABS tablet; Take 1 tablet (500 mcg total) by mouth daily.  Dispense: 30 tablet; Refill: 3 - SPIRIVA HANDIHALER 18 MCG inhalation capsule; PLACE 1 C INTO INHALER AND INHALE D.  Dispense: 30 capsule; Refill: 4 - budesonide-formoterol (SYMBICORT) 160-4.5 MCG/ACT inhaler; Inhale 1 puff into the lungs 2 (two) times daily.  Dispense: 10.2 g; Refill: 4  5. Generalized anxiety disorder Stable at this time patient reports overall she is doing well.  6. Nicotine dependence, cigarettes, with other nicotine-induced disorders Smoking cessation counseling: 1. Pt acknowledges the risks of long term smoking, she will try to quite smoking. 2. Options for different medications including nicotine products, chewing gum, patch etc, Wellbutrin and Chantix is discussed 3. Goal and date of compete cessation is discussed 4. Total time spent in smoking cessation is 15 min.  7. Dysuria - UA/M w/rflx Culture, Routine  General Counseling: Alicia Rivera verbalizes understanding of the findings of todays visit and agrees with plan of treatment. I have discussed any further diagnostic evaluation that may be needed or ordered today. We also reviewed her medications today. she has been encouraged to call the office with any questions or concerns that should arise related to todays visit.   Orders Placed This Encounter  Procedures  . Microscopic Examination  . Urine Culture, Reflex  . UA/M w/rflx Culture, Routine    Meds ordered this encounter  Medications  . albuterol (PROAIR HFA) 108 (90 Base) MCG/ACT inhaler    Sig: Inhale 2 puffs into the lungs every 4 (four) hours.    Dispense:  36 g    Refill:  3    Proair what insurance prefers, Please give two inhalers for each month  . losartan (COZAAR) 25 MG tablet    Sig: Take 1 tablet (25 mg total) by mouth daily.    Dispense:  90 tablet    Refill:  1  . roflumilast (DALIRESP)  500 MCG TABS tablet     Sig: Take 1 tablet (500 mcg total) by mouth daily.    Dispense:  30 tablet    Refill:  3  . SPIRIVA HANDIHALER 18 MCG inhalation capsule    Sig: PLACE 1 C INTO INHALER AND INHALE D.    Dispense:  30 capsule    Refill:  4  . budesonide-formoterol (SYMBICORT) 160-4.5 MCG/ACT inhaler    Sig: Inhale 1 puff into the lungs 2 (two) times daily.    Dispense:  10.2 g    Refill:  4    Time spent: 35 Minutes   This patient was seen by Blima LedgerAdam Riti Rollyson AGNP-C in Collaboration with Dr Lyndon CodeFozia M Khan as a part of collaborative care agreement    Johnna AcostaAdam J. Nare Gaspari AGNP-C Internal Medicine

## 2019-06-26 LAB — MICROSCOPIC EXAMINATION
Bacteria, UA: NONE SEEN
Casts: NONE SEEN /lpf

## 2019-06-26 LAB — UA/M W/RFLX CULTURE, ROUTINE
Bilirubin, UA: NEGATIVE
Glucose, UA: NEGATIVE
Ketones, UA: NEGATIVE
Nitrite, UA: NEGATIVE
Protein,UA: NEGATIVE
RBC, UA: NEGATIVE
Specific Gravity, UA: 1.011 (ref 1.005–1.030)
Urobilinogen, Ur: 0.2 mg/dL (ref 0.2–1.0)
pH, UA: 7.5 (ref 5.0–7.5)

## 2019-06-26 LAB — URINE CULTURE, REFLEX

## 2019-08-12 ENCOUNTER — Other Ambulatory Visit: Payer: Self-pay

## 2019-08-12 ENCOUNTER — Ambulatory Visit (INDEPENDENT_AMBULATORY_CARE_PROVIDER_SITE_OTHER): Payer: Medicare Other | Admitting: Nurse Practitioner

## 2019-08-12 ENCOUNTER — Encounter: Payer: Self-pay | Admitting: Nurse Practitioner

## 2019-08-12 VITALS — BP 130/70 | HR 78 | Ht 65.0 in | Wt 197.0 lb

## 2019-08-12 DIAGNOSIS — J440 Chronic obstructive pulmonary disease with acute lower respiratory infection: Secondary | ICD-10-CM | POA: Diagnosis not present

## 2019-08-12 DIAGNOSIS — R059 Cough, unspecified: Secondary | ICD-10-CM | POA: Insufficient documentation

## 2019-08-12 DIAGNOSIS — R05 Cough: Secondary | ICD-10-CM | POA: Diagnosis not present

## 2019-08-12 MED ORDER — PREDNISONE 10 MG (21) PO TBPK: ORAL_TABLET | ORAL | 0 refills | Status: DC

## 2019-08-12 MED ORDER — AZITHROMYCIN 250 MG PO TABS: ORAL_TABLET | ORAL | 0 refills | Status: DC

## 2019-08-12 NOTE — Progress Notes (Signed)
[facility] [address] [place], [place] [address]  Internal MEDICINE  Telephone Visit  Patient Name: Alicia Rivera  [id number]  [id number]  Date of Service: 08/12/2019  I connected with the patient at 2;07pm  by telephone and verified the patients identity using two identifiers.   I discussed the limitations, risks, security and privacy concerns of performing an evaluation and management service by telephone and the availability of in person appointments. I also discussed with the patient that there may be a patient responsible charge related to the service.  The patient expressed understanding and agrees to proceed.    Chief Complaint  Patient presents with  . Telephone Assessment    oxygen is running in between 88-91, usually runs 97  . Telephone Screen  . Chest Pain  . Cough    The patient has been contacted via telephone for follow up visit due to concerns for spread of novel coronavirus. She states that she is having a flare of her COPD. Has been wheezing and coughing for past few days. Has been using her rescue inhaler and neb treatments. Symptoms are not getting any better. Generally needs to have an antibiotic and steroids to help. This happens often, when weather changes. She denies fever or chills. She denies headache, nausea, or vomiting.       Current Medication: Outpatient Encounter Medications as of 08/12/2019  Medication Sig  . albuterol (PROAIR HFA) 108 (90 Base) MCG/ACT inhaler Inhale 2 puffs into the lungs every 4 (four) hours.  . ALPRAZolam (XANAX) 0.25 MG tablet Half to one tab po qd prn for anxiety  . aspirin EC 81 MG tablet Take 81 mg by mouth daily.  . budesonide-formoterol (SYMBICORT) 160-4.5 MCG/ACT inhaler Inhale 1 puff into the lungs 2 (two) times daily.  . cetirizine (ZYRTEC) 10 MG tablet TAKE 1 TABLET(10 MG) BY MOUTH DAILY  . DULoxetine (CYMBALTA) 30 MG capsule Take 1 capsule (30 mg total) by mouth daily.  Marland Kitchen ipratropium-albuterol  (DUONEB) 0.5-2.5 (3) MG/3ML SOLN USE 3 ML VIA NEBULIZER EVERY 6 HOURS AS NEEDED diag j44.1  . losartan (COZAAR) 25 MG tablet Take 1 tablet (25 mg total) by mouth daily.  . metoprolol succinate (TOPROL-XL) 50 MG 24 hr tablet TAKE 1 TABLET BY MOUTH DAILY, TAKE WITH OR IMMEDIATELY FOLLOWING A MEAL  . montelukast (SINGULAIR) 10 MG tablet TAKE 1 TABLET(10 MG) BY MOUTH AT BEDTIME  . QUEtiapine (SEROQUEL) 25 MG tablet TAKE 1 TABLET(25 MG) BY MOUTH DAILY AS NEEDED  . roflumilast (DALIRESP) 500 MCG TABS tablet Take 1 tablet (500 mcg total) by mouth daily.  Marland Kitchen SPIRIVA HANDIHALER 18 MCG inhalation capsule PLACE 1 C INTO INHALER AND INHALE D.  . azithromycin (ZITHROMAX) 250 MG tablet z-pack - take as directed for 5 days for URI  . predniSONE (STERAPRED UNI-PAK 21 TAB) 10 MG (21) TBPK tablet 6 day taper - take by mouth as directed for 6 days   No facility-administered encounter medications on file as of 08/12/2019.     Surgical History: Past Surgical History:  Procedure Laterality Date  . none      Medical History: Past Medical History:  Diagnosis Date  . Anxiety   . COPD (chronic obstructive pulmonary disease) (HCC)   . HTN (hypertension)   . Pneumonia 09/18/2017  . Tobacco abuse     Family History: Family History  Problem Relation Age of Onset  . Emphysema Brother   . Lung disease Mother   . Heart disease Father  Social History   Socioeconomic History  . Marital status: Single    Spouse name: Not on file  . Number of children: Not on file  . Years of education: Not on file  . Highest education level: Not on file  Occupational History  . Occupation: disabled  Social Needs  . Financial resource strain: Not on file  . Food insecurity    Worry: Not on file    Inability: Not on file  . Transportation needs    Medical: Not on file    Non-medical: Not on file  Tobacco Use  . Smoking status: Former Games developermoker  . Smokeless tobacco: Never Used  . Tobacco comment: quit 6 weeks ago   Substance and Sexual Activity  . Alcohol use: No  . Drug use: No  . Sexual activity: Never  Lifestyle  . Physical activity    Days per week: Not on file    Minutes per session: Not on file  . Stress: Not on file  Relationships  . Social Musicianconnections    Talks on phone: Not on file    Gets together: Not on file    Attends religious service: Not on file    Active member of club or organization: Not on file    Attends meetings of clubs or organizations: Not on file    Relationship status: Not on file  . Intimate partner violence    Fear of current or ex partner: Not on file    Emotionally abused: Not on file    Physically abused: Not on file    Forced sexual activity: Not on file  Other Topics Concern  . Not on file  Social History Narrative  . Not on file      Review of Systems  Constitutional: Positive for fatigue. Negative for chills and fever.  HENT: Positive for congestion, postnasal drip and rhinorrhea. Negative for sore throat.   Respiratory: Positive for cough and wheezing.   Cardiovascular: Negative for chest pain and palpitations.  Gastrointestinal: Negative for nausea and vomiting.  Musculoskeletal: Positive for myalgias.  Allergic/Immunologic: Positive for environmental allergies.  Neurological: Positive for headaches.   Today's Vitals   08/12/19 1334  BP: 130/70  Pulse: 78  SpO2: 91%  Weight: 197 lb (89.4 kg)  Height: 5\' 5"  (1.651 m)   Body mass index is 32.78 kg/m.  Observation/Objective:   The patient is alert and oriented. She is pleasant and answers all questions appropriately. Breathing is non-labored. She is in no acute distress at this time.  The patient has congested, non-productive cough which can be heard over the phone.   Assessment/Plan:  1. Obstructive chronic bronchitis with acute bronchitis (HCC) Start z-pack. Take as directed for 5 days. Add prednisone taper. Take as directed for 6 days. Continue to use inhalers and respiratory  medications as needed and as prescribed . - azithromycin (ZITHROMAX) 250 MG tablet; z-pack - take as directed for 5 days for URI  Dispense: 6 tablet; Refill: 0 - predniSONE (STERAPRED UNI-PAK 21 TAB) 10 MG (21) TBPK tablet; 6 day taper - take by mouth as directed for 6 days  Dispense: 21 tablet; Refill: 0  2. Cough Rescue inhalers and neb treatments as needed and as prescribed.   General Counseling: Afra verbalizes understanding of the findings of today's phone visit and agrees with plan of treatment. I have discussed any further diagnostic evaluation that may be needed or ordered today. We also reviewed her medications today. she has been encouraged to call  the office with any questions or concerns that should arise related to todays visit.   This patient was seen by [place] with Dr Lavera Guise as a part of collaborative care agreement  Meds ordered this encounter  Medications  . azithromycin (ZITHROMAX) 250 MG tablet    Sig: z-pack - take as directed for 5 days for URI    Dispense:  6 tablet    Refill:  0    Order Specific Question:   Supervising Provider    Answer:   Lavera Guise [place]  . predniSONE (STERAPRED UNI-PAK 21 TAB) 10 MG (21) TBPK tablet    Sig: 6 day taper - take by mouth as directed for 6 days    Dispense:  21 tablet    Refill:  0    Order Specific Question:   Supervising Provider    Answer:   Lavera Guise [[id number]]    Time spent: 79 Minutes    Dr Lavera Guise Internal medicine

## 2019-09-06 ENCOUNTER — Other Ambulatory Visit: Payer: Self-pay | Admitting: Adult Health

## 2019-09-06 DIAGNOSIS — F411 Generalized anxiety disorder: Secondary | ICD-10-CM

## 2019-09-20 ENCOUNTER — Telehealth: Payer: Self-pay

## 2019-09-20 NOTE — Telephone Encounter (Signed)
Confirmed appointment with patient. klh °

## 2019-09-21 ENCOUNTER — Telehealth: Payer: Self-pay

## 2019-09-21 NOTE — Telephone Encounter (Signed)
Called lmom asking patient to change appointment to virtual visit. klh 

## 2019-09-22 ENCOUNTER — Ambulatory Visit (INDEPENDENT_AMBULATORY_CARE_PROVIDER_SITE_OTHER): Payer: Medicare Other | Admitting: Adult Health

## 2019-09-22 ENCOUNTER — Encounter: Payer: Self-pay | Admitting: Adult Health

## 2019-09-22 ENCOUNTER — Other Ambulatory Visit: Payer: Self-pay

## 2019-09-22 VITALS — BP 126/63 | HR 64 | Temp 98.6°F | Ht 65.0 in | Wt 191.0 lb

## 2019-09-22 DIAGNOSIS — J449 Chronic obstructive pulmonary disease, unspecified: Secondary | ICD-10-CM | POA: Diagnosis not present

## 2019-09-22 DIAGNOSIS — I10 Essential (primary) hypertension: Secondary | ICD-10-CM

## 2019-09-22 DIAGNOSIS — F411 Generalized anxiety disorder: Secondary | ICD-10-CM | POA: Diagnosis not present

## 2019-09-22 DIAGNOSIS — Z889 Allergy status to unspecified drugs, medicaments and biological substances status: Secondary | ICD-10-CM

## 2019-09-22 DIAGNOSIS — F17218 Nicotine dependence, cigarettes, with other nicotine-induced disorders: Secondary | ICD-10-CM

## 2019-09-22 MED ORDER — BUDESONIDE-FORMOTEROL FUMARATE 160-4.5 MCG/ACT IN AERO: 1.0000 | INHALATION_SPRAY | Freq: Two times a day (BID) | RESPIRATORY_TRACT | 4 refills | Status: DC

## 2019-09-22 MED ORDER — MONTELUKAST SODIUM 10 MG PO TABS: ORAL_TABLET | ORAL | 3 refills | Status: DC

## 2019-09-22 MED ORDER — LOSARTAN POTASSIUM 25 MG PO TABS: 25.0000 mg | ORAL_TABLET | Freq: Every day | ORAL | 1 refills | Status: DC

## 2019-09-22 MED ORDER — IPRATROPIUM-ALBUTEROL 0.5-2.5 (3) MG/3ML IN SOLN: RESPIRATORY_TRACT | 5 refills | Status: DC

## 2019-09-22 MED ORDER — ALPRAZOLAM 0.25 MG PO TABS: ORAL_TABLET | ORAL | 0 refills | Status: DC

## 2019-09-22 MED ORDER — ALBUTEROL SULFATE HFA 108 (90 BASE) MCG/ACT IN AERS: 2.0000 | INHALATION_SPRAY | RESPIRATORY_TRACT | 3 refills | Status: DC

## 2019-09-22 MED ORDER — DALIRESP 500 MCG PO TABS: 500.0000 ug | ORAL_TABLET | Freq: Every day | ORAL | 3 refills | Status: DC

## 2019-09-22 MED ORDER — METOPROLOL SUCCINATE ER 50 MG PO TB24: ORAL_TABLET | ORAL | 2 refills | Status: DC

## 2019-09-22 MED ORDER — SPIRIVA HANDIHALER 18 MCG IN CAPS: ORAL_CAPSULE | RESPIRATORY_TRACT | 4 refills | Status: DC

## 2019-09-22 MED ORDER — DULOXETINE HCL 30 MG PO CPEP: 30.0000 mg | ORAL_CAPSULE | Freq: Every day | ORAL | 3 refills | Status: DC

## 2019-09-22 NOTE — Progress Notes (Signed)
[facility] [address] [place], [place] [address]  Internal MEDICINE  Telephone Visit  Patient Name: Alicia Rivera  [id number]  [id number]  Date of Service: 09/22/2019  I connected with the patient at 950 by telephone and verified the patients identity using two identifiers.   I discussed the limitations, risks, security and privacy concerns of performing an evaluation and management service by telephone and the availability of in person appointments. I also discussed with the patient that there may be a patient responsible charge related to the service.  The patient expressed understanding and agrees to proceed.    Chief Complaint  Patient presents with  . Telephone Screen  . Telephone Assessment  . Anxiety  . Hypertension  . COPD  . Allergies    HPI  Pt seen via telephone. She is being seen for follow up on anxiety, allergies, HTN and copd. Overall she is doing well.  Denies any complaints.  She has not been hospitalized recently, and denies any new/significant symptoms at this time.  She is requesting refills on her medications. She reports her anxiety is well controlled most of the time, and her blood pressure as well as her copd has not been an issue for her.       Current Medication: Outpatient Encounter Medications as of 09/22/2019  Medication Sig  . albuterol (PROAIR HFA) 108 (90 Base) MCG/ACT inhaler Inhale 2 puffs into the lungs every 4 (four) hours.  . ALPRAZolam (XANAX) 0.25 MG tablet Half to one tab po qd prn for anxiety  . aspirin EC 81 MG tablet Take 81 mg by mouth daily.  Marland Kitchen azithromycin (ZITHROMAX) 250 MG tablet z-pack - take as directed for 5 days for URI  . budesonide-formoterol (SYMBICORT) 160-4.5 MCG/ACT inhaler Inhale 1 puff into the lungs 2 (two) times daily.  . cetirizine (ZYRTEC) 10 MG tablet TAKE 1 TABLET(10 MG) BY MOUTH DAILY  . DULoxetine (CYMBALTA) 30 MG capsule Take 1 capsule (30 mg total) by mouth daily.  Marland Kitchen ipratropium-albuterol  (DUONEB) 0.5-2.5 (3) MG/3ML SOLN USE 3 ML VIA NEBULIZER EVERY 6 HOURS AS NEEDED diag j44.1  . losartan (COZAAR) 25 MG tablet Take 1 tablet (25 mg total) by mouth daily.  . metoprolol succinate (TOPROL-XL) 50 MG 24 hr tablet Take with or immediately following a meal.  . montelukast (SINGULAIR) 10 MG tablet TAKE 1 TABLET(10 MG) BY MOUTH AT BEDTIME  . predniSONE (STERAPRED UNI-PAK 21 TAB) 10 MG (21) TBPK tablet 6 day taper - take by mouth as directed for 6 days  . QUEtiapine (SEROQUEL) 25 MG tablet TAKE 1 TABLET(25 MG) BY MOUTH DAILY AS NEEDED  . roflumilast (DALIRESP) 500 MCG TABS tablet Take 1 tablet (500 mcg total) by mouth daily.  Marland Kitchen SPIRIVA HANDIHALER 18 MCG inhalation capsule PLACE 1 C INTO INHALER AND INHALE D.  . [DISCONTINUED] albuterol (PROAIR HFA) 108 (90 Base) MCG/ACT inhaler Inhale 2 puffs into the lungs every 4 (four) hours.  . [DISCONTINUED] ALPRAZolam (XANAX) 0.25 MG tablet Half to one tab po qd prn for anxiety  . [DISCONTINUED] budesonide-formoterol (SYMBICORT) 160-4.5 MCG/ACT inhaler Inhale 1 puff into the lungs 2 (two) times daily.  . [DISCONTINUED] DULoxetine (CYMBALTA) 30 MG capsule Take 1 capsule (30 mg total) by mouth daily.  . [DISCONTINUED] ipratropium-albuterol (DUONEB) 0.5-2.5 (3) MG/3ML SOLN USE 3 ML VIA NEBULIZER EVERY 6 HOURS AS NEEDED diag j44.1  . [DISCONTINUED] losartan (COZAAR) 25 MG tablet Take 1 tablet (25 mg total) by mouth daily.  . [DISCONTINUED] metoprolol  succinate (TOPROL-XL) 50 MG 24 hr tablet TAKE 1 TABLET BY MOUTH DAILY, TAKE WITH OR IMMEDIATELY FOLLOWING A MEAL  . [DISCONTINUED] montelukast (SINGULAIR) 10 MG tablet TAKE 1 TABLET(10 MG) BY MOUTH AT BEDTIME  . [DISCONTINUED] roflumilast (DALIRESP) 500 MCG TABS tablet Take 1 tablet (500 mcg total) by mouth daily.  . [DISCONTINUED] SPIRIVA HANDIHALER 18 MCG inhalation capsule PLACE 1 C INTO INHALER AND INHALE D.   No facility-administered encounter medications on file as of 09/22/2019.    Surgical  History: Past Surgical History:  Procedure Laterality Date  . none      Medical History: Past Medical History:  Diagnosis Date  . Anxiety   . COPD (chronic obstructive pulmonary disease) (HCC)   . HTN (hypertension)   . Pneumonia 09/18/2017  . Tobacco abuse     Family History: Family History  Problem Relation Age of Onset  . Emphysema Brother   . Lung disease Mother   . Heart disease Father     Social History   Socioeconomic History  . Marital status: Single    Spouse name: Not on file  . Number of children: Not on file  . Years of education: Not on file  . Highest education level: Not on file  Occupational History  . Occupation: disabled  Tobacco Use  . Smoking status: Former Games developermoker  . Smokeless tobacco: Never Used  . Tobacco comment: quit 6 weeks ago  Substance and Sexual Activity  . Alcohol use: No  . Drug use: No  . Sexual activity: Never  Other Topics Concern  . Not on file  Social History Narrative  . Not on file   Social Determinants of Health   Financial Resource Strain:   . Difficulty of Paying Living Expenses: Not on file  Food Insecurity:   . Worried About Programme researcher, broadcasting/film/videounning Out of Food in the Last Year: Not on file  . Ran Out of Food in the Last Year: Not on file  Transportation Needs:   . Lack of Transportation (Medical): Not on file  . Lack of Transportation (Non-Medical): Not on file  Physical Activity:   . Days of Exercise per Week: Not on file  . Minutes of Exercise per Session: Not on file  Stress:   . Feeling of Stress : Not on file  Social Connections:   . Frequency of Communication with Friends and Family: Not on file  . Frequency of Social Gatherings with Friends and Family: Not on file  . Attends Religious Services: Not on file  . Active Member of Clubs or Organizations: Not on file  . Attends BankerClub or Organization Meetings: Not on file  . Marital Status: Not on file  Intimate Partner Violence:   . Fear of Current or Ex-Partner: Not on  file  . Emotionally Abused: Not on file  . Physically Abused: Not on file  . Sexually Abused: Not on file      Review of Systems  Constitutional: Negative for chills, fatigue and unexpected weight change.  HENT: Negative for congestion, rhinorrhea, sneezing and sore throat.   Eyes: Negative for photophobia, pain and redness.  Respiratory: Negative for cough, chest tightness and shortness of breath.   Cardiovascular: Negative for chest pain and palpitations.  Gastrointestinal: Negative for abdominal pain, constipation, diarrhea, nausea and vomiting.  Endocrine: Negative.   Genitourinary: Negative for dysuria and frequency.  Musculoskeletal: Negative for arthralgias, back pain, joint swelling and neck pain.  Skin: Negative for rash.  Allergic/Immunologic: Negative.   Neurological: Negative for  tremors and numbness.  Hematological: Negative for adenopathy. Does not bruise/bleed easily.  Psychiatric/Behavioral: Negative for behavioral problems and sleep disturbance. The patient is not nervous/anxious.     Vital Signs: BP 126/63   Pulse 64   Temp 98.6 F (37 C)   Ht 5\' 5"  (1.651 m)   Wt 191 lb (86.6 kg)   SpO2 95%   BMI 31.78 kg/m    Observation/Objective:  Well sounding, NAD noted.   Assessment/Plan: 1. Essential hypertension, benign Stable, continue present management with cozaar and toprol.  - losartan (COZAAR) 25 MG tablet; Take 1 tablet (25 mg total) by mouth daily.  Dispense: 90 tablet; Refill: 1 - metoprolol succinate (TOPROL-XL) 50 MG 24 hr tablet; Take with or immediately following a meal.  Dispense: 90 tablet; Refill: 2  2. Generalized anxiety disorder Continue to use cymbalta Reviewed risks and possible side effects associated with taking opiates, benzodiazepines and other CNS depressants. Combination of these could cause dizziness and drowsiness. Advised patient not to drive or operate machinery when taking these medications, as patient's and other's life can  be at risk and will have consequences. Patient verbalized understanding in this matter. Dependence and abuse for these drugs will be monitored closely. A Controlled substance policy and procedure is on file which allows [facility] to order a urine drug screen test at any visit. Patient understands and agrees with the plan - ALPRAZolam (XANAX) 0.25 MG tablet; Half to one tab po qd prn for anxiety  Dispense: 20 tablet; Refill: 0 - DULoxetine (CYMBALTA) 30 MG capsule; Take 1 capsule (30 mg total) by mouth daily.  Dispense: 90 capsule; Refill: 3  3. Nicotine dependence, cigarettes, with other nicotine-induced disorders Smoking cessation counseling: 1. Pt acknowledges the risks of long term smoking, she will try to quite smoking. 2. Options for different medications including nicotine products, chewing gum, patch etc, Wellbutrin and Chantix is discussed 3. Goal and date of compete cessation is discussed 4. Total time spent in smoking cessation is 15 min.  4. Obstructive chronic bronchitis without exacerbation (HCC) Stable, continue ot use inhalers as prescribed.  - albuterol (PROAIR HFA) 108 (90 Base) MCG/ACT inhaler; Inhale 2 puffs into the lungs every 4 (four) hours.  Dispense: 36 g; Refill: 3 - budesonide-formoterol (SYMBICORT) 160-4.5 MCG/ACT inhaler; Inhale 1 puff into the lungs 2 (two) times daily.  Dispense: 10.2 g; Refill: 4 - SPIRIVA HANDIHALER 18 MCG inhalation capsule; PLACE 1 C INTO INHALER AND INHALE D.  Dispense: 30 capsule; Refill: 4 - roflumilast (DALIRESP) 500 MCG TABS tablet; Take 1 tablet (500 mcg total) by mouth daily.  Dispense: 30 tablet; Refill: 3  5. Multiple allergies Continue singulair, refilled at this time.  - montelukast (SINGULAIR) 10 MG tablet; TAKE 1 TABLET(10 MG) BY MOUTH AT BEDTIME  Dispense: 90 tablet; Refill: 3  General Counseling: Tera verbalizes understanding of the findings of today's phone visit and agrees with plan of treatment. I have discussed  any further diagnostic evaluation that may be needed or ordered today. We also reviewed her medications today. she has been encouraged to call the office with any questions or concerns that should arise related to todays visit.    No orders of the defined types were placed in this encounter.   Meds ordered this encounter  Medications  . ALPRAZolam (XANAX) 0.25 MG tablet    Sig: Half to one tab po qd prn for anxiety    Dispense:  20 tablet    Refill:  0  .  albuterol (PROAIR HFA) 108 (90 Base) MCG/ACT inhaler    Sig: Inhale 2 puffs into the lungs every 4 (four) hours.    Dispense:  36 g    Refill:  3    Proair what insurance prefers, Please give two inhalers for each month  . budesonide-formoterol (SYMBICORT) 160-4.5 MCG/ACT inhaler    Sig: Inhale 1 puff into the lungs 2 (two) times daily.    Dispense:  10.2 g    Refill:  4  . DULoxetine (CYMBALTA) 30 MG capsule    Sig: Take 1 capsule (30 mg total) by mouth daily.    Dispense:  90 capsule    Refill:  3  . ipratropium-albuterol (DUONEB) 0.5-2.5 (3) MG/3ML SOLN    Sig: USE 3 ML VIA NEBULIZER EVERY 6 HOURS AS NEEDED diag j44.1    Dispense:  3240 mL    Refill:  5    **Patient requests 90 days supply**  . losartan (COZAAR) 25 MG tablet    Sig: Take 1 tablet (25 mg total) by mouth daily.    Dispense:  90 tablet    Refill:  1  . metoprolol succinate (TOPROL-XL) 50 MG 24 hr tablet    Sig: Take with or immediately following a meal.    Dispense:  90 tablet    Refill:  2  . montelukast (SINGULAIR) 10 MG tablet    Sig: TAKE 1 TABLET(10 MG) BY MOUTH AT BEDTIME    Dispense:  90 tablet    Refill:  3  . SPIRIVA HANDIHALER 18 MCG inhalation capsule    Sig: PLACE 1 C INTO INHALER AND INHALE D.    Dispense:  30 capsule    Refill:  4  . roflumilast (DALIRESP) 500 MCG TABS tablet    Sig: Take 1 tablet (500 mcg total) by mouth daily.    Dispense:  30 tablet    Refill:  3    Time spent:15 [place] AGNP-C Internal  medicine

## 2019-09-23 ENCOUNTER — Other Ambulatory Visit: Payer: Self-pay

## 2019-09-23 MED ORDER — ALBUTEROL SULFATE (2.5 MG/3ML) 0.083% IN NEBU: 2.5000 mg | INHALATION_SOLUTION | Freq: Four times a day (QID) | RESPIRATORY_TRACT | 2 refills | Status: DC | PRN

## 2019-09-23 MED ORDER — IPRATROPIUM BROMIDE 0.02 % IN SOLN: 0.5000 mg | Freq: Four times a day (QID) | RESPIRATORY_TRACT | 2 refills | Status: DC | PRN

## 2019-11-07 ENCOUNTER — Ambulatory Visit (INDEPENDENT_AMBULATORY_CARE_PROVIDER_SITE_OTHER): Payer: Medicare Other | Admitting: Adult Health

## 2019-11-07 ENCOUNTER — Encounter: Payer: Self-pay | Admitting: Adult Health

## 2019-11-07 VITALS — BP 128/61 | HR 75 | Temp 98.1°F | Ht 65.0 in | Wt 183.0 lb

## 2019-11-07 DIAGNOSIS — J449 Chronic obstructive pulmonary disease, unspecified: Secondary | ICD-10-CM

## 2019-11-07 DIAGNOSIS — F17218 Nicotine dependence, cigarettes, with other nicotine-induced disorders: Secondary | ICD-10-CM | POA: Diagnosis not present

## 2019-11-07 DIAGNOSIS — J4489 Other specified chronic obstructive pulmonary disease: Secondary | ICD-10-CM

## 2019-11-07 DIAGNOSIS — J988 Other specified respiratory disorders: Secondary | ICD-10-CM | POA: Diagnosis not present

## 2019-11-07 DIAGNOSIS — F411 Generalized anxiety disorder: Secondary | ICD-10-CM

## 2019-11-07 DIAGNOSIS — H2513 Age-related nuclear cataract, bilateral: Secondary | ICD-10-CM | POA: Diagnosis not present

## 2019-11-07 MED ORDER — ALPRAZOLAM 0.25 MG PO TABS: ORAL_TABLET | ORAL | 0 refills | Status: DC

## 2019-11-07 MED ORDER — ALBUTEROL SULFATE (2.5 MG/3ML) 0.083% IN NEBU: 2.5000 mg | INHALATION_SOLUTION | Freq: Four times a day (QID) | RESPIRATORY_TRACT | 2 refills | Status: DC | PRN

## 2019-11-07 MED ORDER — AZITHROMYCIN 250 MG PO TABS: ORAL_TABLET | ORAL | 0 refills | Status: DC

## 2019-11-07 NOTE — Progress Notes (Signed)
[facility] [place], [place] [address]  Internal MEDICINE  Telephone Visit  Patient Name: Alicia Rivera  [id number]  [id number]  Date of Service: 11/07/2019  I connected with the patient at 1129 by telephone and verified the patients identity using two identifiers.   I discussed the limitations, risks, security and privacy concerns of performing an evaluation and management service by telephone and the availability of in person appointments. I also discussed with the patient that there may be a patient responsible charge related to the service.  The patient expressed understanding and agrees to proceed.    Chief Complaint  Patient presents with  . Telephone Assessment  . Telephone Screen  . Cough    Greenish going on for few days   . Wheezing  . Medication Refill    duoneb    HPI  Pt seen via telephone today.  She is complaining of cough x 3 days.  She also is wheezing, and has some green mucous times.  She is using her inhalers an nebulizer with some relief. She denies fever, or sob.  She does need a refill on her duonebs at this time.    Current Medication: Outpatient Encounter Medications as of 11/07/2019  Medication Sig  . albuterol (PROAIR HFA) 108 (90 Base) MCG/ACT inhaler Inhale 2 puffs into the lungs every 4 (four) hours.  Marland Kitchen albuterol (PROVENTIL) (2.5 MG/3ML) 0.083% nebulizer solution Take 3 mLs (2.5 mg total) by nebulization every 6 (six) hours as needed for wheezing or shortness of breath.  . ALPRAZolam (XANAX) 0.25 MG tablet Half to one tab po qd prn for anxiety  . aspirin EC 81 MG tablet Take 81 mg by mouth daily.  Marland Kitchen azithromycin (ZITHROMAX) 250 MG tablet z-pack - take as directed for 5 days for URI  . budesonide-formoterol (SYMBICORT) 160-4.5 MCG/ACT inhaler Inhale 1 puff into the lungs 2 (two) times daily.  . cetirizine (ZYRTEC) 10 MG tablet TAKE 1 TABLET(10 MG) BY MOUTH DAILY  . DULoxetine (CYMBALTA) 30 MG capsule Take 1 capsule (30 mg  total) by mouth daily.  Marland Kitchen ipratropium (ATROVENT) 0.02 % nebulizer solution Take 2.5 mLs (0.5 mg total) by nebulization every 6 (six) hours as needed for wheezing or shortness of breath.  . losartan (COZAAR) 25 MG tablet Take 1 tablet (25 mg total) by mouth daily.  . metoprolol succinate (TOPROL-XL) 50 MG 24 hr tablet Take with or immediately following a meal.  . montelukast (SINGULAIR) 10 MG tablet TAKE 1 TABLET(10 MG) BY MOUTH AT BEDTIME  . predniSONE (STERAPRED UNI-PAK 21 TAB) 10 MG (21) TBPK tablet 6 day taper - take by mouth as directed for 6 days  . QUEtiapine (SEROQUEL) 25 MG tablet TAKE 1 TABLET(25 MG) BY MOUTH DAILY AS NEEDED  . roflumilast (DALIRESP) 500 MCG TABS tablet Take 1 tablet (500 mcg total) by mouth daily.  Marland Kitchen SPIRIVA HANDIHALER 18 MCG inhalation capsule PLACE 1 C INTO INHALER AND INHALE D.  . [DISCONTINUED] albuterol (PROVENTIL) (2.5 MG/3ML) 0.083% nebulizer solution Take 3 mLs (2.5 mg total) by nebulization every 6 (six) hours as needed for wheezing or shortness of breath.  . [DISCONTINUED] ALPRAZolam (XANAX) 0.25 MG tablet Half to one tab po qd prn for anxiety  . azithromycin (ZITHROMAX) 250 MG tablet Take as directed   No facility-administered encounter medications on file as of 11/07/2019.    Surgical History: Past Surgical History:  Procedure Laterality Date  . none      Medical History: Past Medical  History:  Diagnosis Date  . Anxiety   . COPD (chronic obstructive pulmonary disease) (HCC)   . HTN (hypertension)   . Pneumonia 09/18/2017  . Tobacco abuse     Family History: Family History  Problem Relation Age of Onset  . Emphysema Brother   . Lung disease Mother   . Heart disease Father     Social History   Socioeconomic History  . Marital status: Single    Spouse name: Not on file  . Number of children: Not on file  . Years of education: Not on file  . Highest education level: Not on file  Occupational History  . Occupation: disabled  Tobacco  Use  . Smoking status: Former Games developer  . Smokeless tobacco: Never Used  . Tobacco comment: quit 6 weeks ago  Substance and Sexual Activity  . Alcohol use: No  . Drug use: No  . Sexual activity: Never  Other Topics Concern  . Not on file  Social History Narrative  . Not on file   Social Determinants of Health   Financial Resource Strain:   . Difficulty of Paying Living Expenses: Not on file  Food Insecurity:   . Worried About Programme researcher, broadcasting/film/video in the Last Year: Not on file  . Ran Out of Food in the Last Year: Not on file  Transportation Needs:   . Lack of Transportation (Medical): Not on file  . Lack of Transportation (Non-Medical): Not on file  Physical Activity:   . Days of Exercise per Week: Not on file  . Minutes of Exercise per Session: Not on file  Stress:   . Feeling of Stress : Not on file  Social Connections:   . Frequency of Communication with Friends and Family: Not on file  . Frequency of Social Gatherings with Friends and Family: Not on file  . Attends Religious Services: Not on file  . Active Member of Clubs or Organizations: Not on file  . Attends Banker Meetings: Not on file  . Marital Status: Not on file  Intimate Partner Violence:   . Fear of Current or Ex-Partner: Not on file  . Emotionally Abused: Not on file  . Physically Abused: Not on file  . Sexually Abused: Not on file      Review of Systems  Constitutional: Negative for chills, fatigue and unexpected weight change.  HENT: Negative for congestion, rhinorrhea, sneezing and sore throat.   Eyes: Negative for photophobia, pain and redness.  Respiratory: Negative for cough, chest tightness and shortness of breath.   Cardiovascular: Negative for chest pain and palpitations.  Gastrointestinal: Negative for abdominal pain, constipation, diarrhea, nausea and vomiting.  Endocrine: Negative.   Genitourinary: Negative for dysuria and frequency.  Musculoskeletal: Negative for arthralgias,  back pain, joint swelling and neck pain.  Skin: Negative for rash.  Allergic/Immunologic: Negative.   Neurological: Negative for tremors and numbness.  Hematological: Negative for adenopathy. Does not bruise/bleed easily.  Psychiatric/Behavioral: Negative for behavioral problems and sleep disturbance. The patient is not nervous/anxious.     Vital Signs: BP 128/61   Pulse 75   Temp 98.1 F (36.7 C)   Ht 5\' 5"  (1.651 m)   Wt 183 lb (83 kg)   SpO2 92% Comment: ROOM AIR  BMI 30.45 kg/m    Observation/Objective:  Well sounding, speaking in full sentences, mild cough.   Assessment/Plan: 1. Respiratory infection Advised patient to take entire course of antibiotics as prescribed with food. Pt should return to clinic  in 7-10 days if symptoms fail to improve or new symptoms develop.  - azithromycin (ZITHROMAX) 250 MG tablet; Take as directed  Dispense: 6 tablet; Refill: 0  2. Obstructive chronic bronchitis without exacerbation (HCC) Stable, continue to use dubnebs as prescribed - albuterol (PROVENTIL) (2.5 MG/3ML) 0.083% nebulizer solution; Take 3 mLs (2.5 mg total) by nebulization every 6 (six) hours as needed for wheezing or shortness of breath.  Dispense: 225 mL; Refill: 2  3. Generalized anxiety disorder Reviewed risks and possible side effects associated with taking opiates, benzodiazepines and other CNS depressants. Combination of these could cause dizziness and drowsiness. Advised patient not to drive or operate machinery when taking these medications, as patient's and other's life can be at risk and will have consequences. Patient verbalized understanding in this matter. Dependence and abuse for these drugs will be monitored closely. A Controlled substance policy and procedure is on file which allows [facility] to order a urine drug screen test at any visit. Patient understands and agrees with the plan - ALPRAZolam (XANAX) 0.25 MG tablet; Half to one tab po qd prn for  anxiety  Dispense: 20 tablet; Refill: 0  4. Nicotine dependence, cigarettes, with other nicotine-induced disorders Smoking cessation counseling: 1. Pt acknowledges the risks of long term smoking, she will try to quite smoking. 2. Options for different medications including nicotine products, chewing gum, patch etc, Wellbutrin and Chantix is discussed 3. Goal and date of compete cessation is discussed 4. Total time spent in smoking cessation is 15 min.   General Counseling: Alicia Rivera verbalizes understanding of the findings of today's phone visit and agrees with plan of treatment. I have discussed any further diagnostic evaluation that may be needed or ordered today. We also reviewed her medications today. she has been encouraged to call the office with any questions or concerns that should arise related to todays visit.    No orders of the defined types were placed in this encounter.   Meds ordered this encounter  Medications  . albuterol (PROVENTIL) (2.5 MG/3ML) 0.083% nebulizer solution    Sig: Take 3 mLs (2.5 mg total) by nebulization every 6 (six) hours as needed for wheezing or shortness of breath.    Dispense:  225 mL    Refill:  2  . ALPRAZolam (XANAX) 0.25 MG tablet    Sig: Half to one tab po qd prn for anxiety    Dispense:  20 tablet    Refill:  0  . azithromycin (ZITHROMAX) 250 MG tablet    Sig: Take as directed    Dispense:  6 tablet    Refill:  0    Time spent: 15 Minutes  This patient was seen by Blima Ledger AGNP-C in Collaboration with Dr. Freda Munro as a part of collaborative care agreement.   Blima Ledger AGNP-C Pulmonary medicine

## 2019-11-18 DIAGNOSIS — J449 Chronic obstructive pulmonary disease, unspecified: Secondary | ICD-10-CM | POA: Diagnosis not present

## 2019-11-18 DIAGNOSIS — H2512 Age-related nuclear cataract, left eye: Secondary | ICD-10-CM | POA: Diagnosis not present

## 2019-11-22 ENCOUNTER — Encounter: Payer: Self-pay | Admitting: Adult Health

## 2019-11-22 ENCOUNTER — Ambulatory Visit (INDEPENDENT_AMBULATORY_CARE_PROVIDER_SITE_OTHER): Payer: Medicare Other | Admitting: Adult Health

## 2019-11-22 ENCOUNTER — Other Ambulatory Visit: Payer: Self-pay

## 2019-11-22 VITALS — BP 109/69 | HR 73 | Temp 98.0°F | Resp 16 | Ht 65.0 in | Wt 160.0 lb

## 2019-11-22 DIAGNOSIS — F17218 Nicotine dependence, cigarettes, with other nicotine-induced disorders: Secondary | ICD-10-CM

## 2019-11-22 DIAGNOSIS — I10 Essential (primary) hypertension: Secondary | ICD-10-CM | POA: Diagnosis not present

## 2019-11-22 DIAGNOSIS — R05 Cough: Secondary | ICD-10-CM | POA: Diagnosis not present

## 2019-11-22 DIAGNOSIS — J449 Chronic obstructive pulmonary disease, unspecified: Secondary | ICD-10-CM | POA: Diagnosis not present

## 2019-11-22 DIAGNOSIS — R059 Cough, unspecified: Secondary | ICD-10-CM

## 2019-11-22 MED ORDER — GUAIFENESIN-DM 100-10 MG/5ML PO SYRP: 5.0000 mL | ORAL_SOLUTION | ORAL | 0 refills | Status: DC | PRN

## 2019-11-22 MED ORDER — PREDNISONE 10 MG PO TABS: ORAL_TABLET | ORAL | 0 refills | Status: DC

## 2019-11-22 NOTE — Progress Notes (Signed)
[facility] [address] [place], [place] [address]  Internal MEDICINE  Telephone Visit  Patient Name: Alicia Rivera  [id number]  [id number]  Date of Service: 11/22/2019  I connected with the patient at [phone] by telephone and verified the patients identity using two identifiers.   I discussed the limitations, risks, security and privacy concerns of performing an evaluation and management service by telephone and the availability of in person appointments. I also discussed with the patient that there may be a patient responsible charge related to the service.  The patient expressed understanding and agrees to proceed.    Chief Complaint  Patient presents with  . Telephone Screen  . Telephone Assessment  . Hypertension  . Cough    constant cough  . Breathing Problem    having trouble breathing    HPI  Pt reports she has been coughing, and having sob intermittently.  She reports the cough is a dry cough.  She feels like her lungs are tight.  She has recently completed a course of azithromycin.  She denies any fever or worse sob.    Current Medication: Outpatient Encounter Medications as of 11/22/2019  Medication Sig  . albuterol (PROAIR HFA) 108 (90 Base) MCG/ACT inhaler Inhale 2 puffs into the lungs every 4 (four) hours.  Marland Kitchen albuterol (PROVENTIL) (2.5 MG/3ML) 0.083% nebulizer solution Take 3 mLs (2.5 mg total) by nebulization every 6 (six) hours as needed for wheezing or shortness of breath.  . ALPRAZolam (XANAX) 0.25 MG tablet Half to one tab po qd prn for anxiety  . aspirin EC 81 MG tablet Take 81 mg by mouth daily.  Marland Kitchen azithromycin (ZITHROMAX) 250 MG tablet z-pack - take as directed for 5 days for URI  . azithromycin (ZITHROMAX) 250 MG tablet Take as directed  . budesonide-formoterol (SYMBICORT) 160-4.5 MCG/ACT inhaler Inhale 1 puff into the lungs 2 (two) times daily.  . cetirizine (ZYRTEC) 10 MG tablet TAKE 1 TABLET(10 MG) BY MOUTH DAILY  . DULoxetine (CYMBALTA) 30  MG capsule Take 1 capsule (30 mg total) by mouth daily.  Marland Kitchen ipratropium (ATROVENT) 0.02 % nebulizer solution Take 2.5 mLs (0.5 mg total) by nebulization every 6 (six) hours as needed for wheezing or shortness of breath.  . losartan (COZAAR) 25 MG tablet Take 1 tablet (25 mg total) by mouth daily.  . metoprolol succinate (TOPROL-XL) 50 MG 24 hr tablet Take with or immediately following a meal.  . montelukast (SINGULAIR) 10 MG tablet TAKE 1 TABLET(10 MG) BY MOUTH AT BEDTIME  . predniSONE (STERAPRED UNI-PAK 21 TAB) 10 MG (21) TBPK tablet 6 day taper - take by mouth as directed for 6 days  . QUEtiapine (SEROQUEL) 25 MG tablet TAKE 1 TABLET(25 MG) BY MOUTH DAILY AS NEEDED  . roflumilast (DALIRESP) 500 MCG TABS tablet Take 1 tablet (500 mcg total) by mouth daily.  Marland Kitchen SPIRIVA HANDIHALER 18 MCG inhalation capsule PLACE 1 C INTO INHALER AND INHALE D.  . guaiFENesin-dextromethorphan (ROBITUSSIN DM) 100-10 MG/5ML syrup Take 5 mLs by mouth every 4 (four) hours as needed for cough.  . predniSONE (DELTASONE) 10 MG tablet Use per dose pack   No facility-administered encounter medications on file as of 11/22/2019.    Surgical History: Past Surgical History:  Procedure Laterality Date  . none      Medical History: Past Medical History:  Diagnosis Date  . Anxiety   . COPD (chronic obstructive pulmonary disease) (HCC)   . HTN (hypertension)   . Pneumonia 09/18/2017  .  Tobacco abuse     Family History: Family History  Problem Relation Age of Onset  . Emphysema Brother   . Lung disease Mother   . Heart disease Father     Social History   Socioeconomic History  . Marital status: Single    Spouse name: Not on file  . Number of children: Not on file  . Years of education: Not on file  . Highest education level: Not on file  Occupational History  . Occupation: disabled  Tobacco Use  . Smoking status: Former Research scientist (life sciences)  . Smokeless tobacco: Never Used  . Tobacco comment: quit 6 weeks ago   Substance and Sexual Activity  . Alcohol use: No  . Drug use: No  . Sexual activity: Never  Other Topics Concern  . Not on file  Social History Narrative  . Not on file   Social Determinants of Health   Financial Resource Strain:   . Difficulty of Paying Living Expenses: Not on file  Food Insecurity:   . Worried About Charity fundraiser in the Last Year: Not on file  . Ran Out of Food in the Last Year: Not on file  Transportation Needs:   . Lack of Transportation (Medical): Not on file  . Lack of Transportation (Non-Medical): Not on file  Physical Activity:   . Days of Exercise per Week: Not on file  . Minutes of Exercise per Session: Not on file  Stress:   . Feeling of Stress : Not on file  Social Connections:   . Frequency of Communication with Friends and Family: Not on file  . Frequency of Social Gatherings with Friends and Family: Not on file  . Attends Religious Services: Not on file  . Active Member of Clubs or Organizations: Not on file  . Attends Archivist Meetings: Not on file  . Marital Status: Not on file  Intimate Partner Violence:   . Fear of Current or Ex-Partner: Not on file  . Emotionally Abused: Not on file  . Physically Abused: Not on file  . Sexually Abused: Not on file      Review of Systems  Constitutional: Negative for chills, fatigue and unexpected weight change.  HENT: Negative for congestion, rhinorrhea, sneezing and sore throat.   Eyes: Negative for photophobia, pain and redness.  Respiratory: Negative for cough, chest tightness and shortness of breath.   Cardiovascular: Negative for chest pain and palpitations.  Gastrointestinal: Negative for abdominal pain, constipation, diarrhea, nausea and vomiting.  Endocrine: Negative.   Genitourinary: Negative for dysuria and frequency.  Musculoskeletal: Negative for arthralgias, back pain, joint swelling and neck pain.  Skin: Negative for rash.  Allergic/Immunologic: Negative.    Neurological: Negative for tremors and numbness.  Hematological: Negative for adenopathy. Does not bruise/bleed easily.  Psychiatric/Behavioral: Negative for behavioral problems and sleep disturbance. The patient is not nervous/anxious.     Vital Signs: BP 109/69   Pulse 73   Temp 98 F (36.7 C)   Resp 16   Ht 5\' 5"  (1.651 m)   Wt 160 lb (72.6 kg)   SpO2 95%   BMI 26.63 kg/m    Observation/Objective:  Well sounding, NAD noted.   Assessment/Plan: 1. Cough Use cough syrup, and get CXR.  Concern for other causes as patient has not had relief with antibiotics.  - guaiFENesin-dextromethorphan (ROBITUSSIN DM) 100-10 MG/5ML syrup; Take 5 mLs by mouth every 4 (four) hours as needed for cough.  Dispense: 118 mL; Refill: 0 - DG  Chest 2 View; Future  2. Nicotine dependence, cigarettes, with other nicotine-induced disorders Smoking cessation counseling: 1. Pt acknowledges the risks of long term smoking, she will try to quite smoking. 2. Options for different medications including nicotine products, chewing gum, patch etc, Wellbutrin and Chantix is discussed 3. Goal and date of compete cessation is discussed 4. Total time spent in smoking cessation is 15 min. 5.  3. Obstructive chronic bronchitis without exacerbation (HCC) Use steroid taper as prescribed.  - predniSONE (DELTASONE) 10 MG tablet; Use per dose pack  Dispense: 21 tablet; Refill: 0  4. Essential hypertension, benign Stable, continue present mgmt  General Counseling: [place] verbalizes understanding of the findings of today's phone visit and agrees with plan of treatment. I have discussed any further diagnostic evaluation that may be needed or ordered today. We also reviewed her medications today. she has been encouraged to call the office with any questions or concerns that should arise related to todays visit.    Orders Placed This Encounter  Procedures  . DG Chest 2 View    Meds ordered this encounter   Medications  . guaiFENesin-dextromethorphan (ROBITUSSIN DM) 100-10 MG/5ML syrup    Sig: Take 5 mLs by mouth every 4 (four) hours as needed for cough.    Dispense:  118 mL    Refill:  0  . predniSONE (DELTASONE) 10 MG tablet    Sig: Use per dose pack    Dispense:  21 tablet    Refill:  0    Time spent: 15 Minutes   Blima Ledger AGNP-C Pulmonary medicine

## 2019-12-05 ENCOUNTER — Other Ambulatory Visit: Payer: Self-pay | Admitting: Adult Health

## 2019-12-05 DIAGNOSIS — Z889 Allergy status to unspecified drugs, medicaments and biological substances status: Secondary | ICD-10-CM

## 2019-12-05 DIAGNOSIS — F411 Generalized anxiety disorder: Secondary | ICD-10-CM

## 2019-12-05 MED ORDER — QUETIAPINE FUMARATE 25 MG PO TABS: ORAL_TABLET | ORAL | 2 refills | Status: DC

## 2019-12-07 ENCOUNTER — Encounter: Payer: Self-pay | Admitting: Ophthalmology

## 2019-12-07 ENCOUNTER — Other Ambulatory Visit: Payer: Self-pay

## 2019-12-09 ENCOUNTER — Other Ambulatory Visit
Admission: RE | Admit: 2019-12-09 | Discharge: 2019-12-09 | Disposition: A | Discharge status: Home / Self Care | Payer: Medicare Other | Source: Ambulatory Visit | Attending: Ophthalmology | Admitting: Ophthalmology

## 2019-12-09 ENCOUNTER — Other Ambulatory Visit: Payer: Self-pay

## 2019-12-09 DIAGNOSIS — Z01812 Encounter for preprocedural laboratory examination: Secondary | ICD-10-CM | POA: Diagnosis not present

## 2019-12-09 DIAGNOSIS — Z20822 Contact with and (suspected) exposure to covid-19: Secondary | ICD-10-CM | POA: Insufficient documentation

## 2019-12-10 LAB — SARS CORONAVIRUS 2 (TAT 6-24 HRS): SARS Coronavirus 2: NEGATIVE

## 2019-12-12 NOTE — Discharge Instructions (Signed)

## 2019-12-13 ENCOUNTER — Ambulatory Visit
Admission: RE | Admit: 2019-12-13 | Discharge: 2019-12-13 | Disposition: A | Discharge status: Home / Self Care | Payer: Medicare Other | Attending: Ophthalmology | Admitting: Ophthalmology

## 2019-12-13 ENCOUNTER — Ambulatory Visit: Payer: Medicare Other | Admitting: Anesthesiology

## 2019-12-13 ENCOUNTER — Other Ambulatory Visit: Payer: Self-pay

## 2019-12-13 ENCOUNTER — Encounter
Admission: RE | Disposition: A | Discharge status: Home / Self Care | Payer: Self-pay | Source: Home / Self Care | Attending: Ophthalmology

## 2019-12-13 ENCOUNTER — Encounter: Payer: Self-pay | Admitting: Ophthalmology

## 2019-12-13 DIAGNOSIS — H2512 Age-related nuclear cataract, left eye: Secondary | ICD-10-CM | POA: Diagnosis not present

## 2019-12-13 DIAGNOSIS — H25812 Combined forms of age-related cataract, left eye: Secondary | ICD-10-CM | POA: Diagnosis not present

## 2019-12-13 DIAGNOSIS — Z882 Allergy status to sulfonamides status: Secondary | ICD-10-CM | POA: Diagnosis not present

## 2019-12-13 HISTORY — PX: CATARACT EXTRACTION W/PHACO: SHX586

## 2019-12-13 SURGERY — PHACOEMULSIFICATION, CATARACT, WITH IOL INSERTION
Anesthesia: Monitor Anesthesia Care | Site: Eye | Laterality: Left

## 2019-12-13 MED ORDER — FENTANYL CITRATE (PF) 100 MCG/2ML IJ SOLN
INTRAMUSCULAR | Status: DC | PRN
  Administered 2019-12-13: 50 ug via INTRAVENOUS

## 2019-12-13 MED ORDER — ARMC OPHTHALMIC DILATING DROPS
1.0000 "application " | OPHTHALMIC | Status: DC | PRN
  Administered 2019-12-13 (×3): 1 via OPHTHALMIC

## 2019-12-13 MED ORDER — MOXIFLOXACIN HCL 0.5 % OP SOLN
OPHTHALMIC | Status: DC | PRN
  Administered 2019-12-13: 0.2 mL via OPHTHALMIC

## 2019-12-13 MED ORDER — LIDOCAINE HCL (PF) 2 % IJ SOLN
INTRAOCULAR | Status: DC | PRN
  Administered 2019-12-13: 1 mL

## 2019-12-13 MED ORDER — TETRACAINE HCL 0.5 % OP SOLN
1.0000 [drp] | OPHTHALMIC | Status: DC | PRN
  Administered 2019-12-13 (×3): 1 [drp] via OPHTHALMIC

## 2019-12-13 MED ORDER — NA CHONDROIT SULF-NA HYALURON 40-17 MG/ML IO SOLN
INTRAOCULAR | Status: DC | PRN
  Administered 2019-12-13: 1 mL via INTRAOCULAR

## 2019-12-13 MED ORDER — EPINEPHRINE PF 1 MG/ML IJ SOLN
INTRAOCULAR | Status: DC | PRN
  Administered 2019-12-13: 49 mL via OPHTHALMIC

## 2019-12-13 MED ORDER — BRIMONIDINE TARTRATE-TIMOLOL 0.2-0.5 % OP SOLN
OPHTHALMIC | Status: DC | PRN
  Administered 2019-12-13: 1 [drp] via OPHTHALMIC

## 2019-12-13 MED ORDER — MIDAZOLAM HCL 2 MG/2ML IJ SOLN
INTRAMUSCULAR | Status: DC | PRN
  Administered 2019-12-13: 1.5 mg via INTRAVENOUS

## 2019-12-13 SURGICAL SUPPLY — 22 items
CANNULA ANT/CHMB 27G (MISCELLANEOUS) ×2 IMPLANT
CANNULA ANT/CHMB 27GA (MISCELLANEOUS) ×4 IMPLANT
GLOVE SURG LX 8.0 MICRO (GLOVE) ×2
GLOVE SURG LX STRL 8.0 MICRO (GLOVE) ×1 IMPLANT
GLOVE SURG TRIUMPH 8.0 PF LTX (GLOVE) ×2 IMPLANT
GOWN STRL REUS W/ TWL LRG LVL3 (GOWN DISPOSABLE) ×2 IMPLANT
GOWN STRL REUS W/TWL LRG LVL3 (GOWN DISPOSABLE) ×2
LENS IOL TECNIS ITEC 15.0 (Intraocular Lens) ×1 IMPLANT
MARKER SKIN DUAL TIP RULER LAB (MISCELLANEOUS) ×2 IMPLANT
NDL FILTER BLUNT 18X1 1/2 (NEEDLE) ×1 IMPLANT
NDL RETROBULBAR .5 NSTRL (NEEDLE) ×2 IMPLANT
NEEDLE FILTER BLUNT 18X 1/2SAF (NEEDLE) ×1
NEEDLE FILTER BLUNT 18X1 1/2 (NEEDLE) ×1 IMPLANT
PACK EYE AFTER SURG (MISCELLANEOUS) ×2 IMPLANT
PACK OPTHALMIC (MISCELLANEOUS) ×2 IMPLANT
PACK PORFILIO (MISCELLANEOUS) ×2 IMPLANT
SUT ETHILON 10-0 CS-B-6CS-B-6 (SUTURE)
SUTURE EHLN 10-0 CS-B-6CS-B-6 (SUTURE) IMPLANT
SYR 3ML LL SCALE MARK (SYRINGE) ×2 IMPLANT
SYR TB 1ML LUER SLIP (SYRINGE) ×2 IMPLANT
WATER STERILE IRR 250ML POUR (IV SOLUTION) ×2 IMPLANT
WIPE NON LINTING 3.25X3.25 (MISCELLANEOUS) ×2 IMPLANT

## 2019-12-13 NOTE — Transfer of Care (Signed)
Immediate Anesthesia Transfer of Care Note  Patient: Alicia Rivera  Procedure(s) Performed: CATARACT EXTRACTION PHACO AND INTRAOCULAR LENS PLACEMENT (IOC) LEFT (Left Eye)  Patient Location: PACU  Anesthesia Type: MAC  Level of Consciousness: awake, alert  and patient cooperative  Airway and Oxygen Therapy: Patient Spontanous Breathing and Patient connected to supplemental oxygen  Post-op Assessment: Post-op Vital signs reviewed, Patient's Cardiovascular Status Stable, Respiratory Function Stable, Patent Airway and No signs of Nausea or vomiting  Post-op Vital Signs: Reviewed and stable  Complications: No apparent anesthesia complications

## 2019-12-13 NOTE — Anesthesia Postprocedure Evaluation (Signed)
Anesthesia Post Note  Patient: Alicia Rivera  Procedure(s) Performed: CATARACT EXTRACTION PHACO AND INTRAOCULAR LENS PLACEMENT ([place]) LEFT (Left Eye)     Patient location during evaluation: PACU Anesthesia Type: MAC Level of consciousness: awake and alert and oriented Pain management: satisfactory to patient Vital Signs Assessment: post-procedure vital signs reviewed and stable Respiratory status: spontaneous breathing, nonlabored ventilation and respiratory function stable Cardiovascular status: blood pressure returned to baseline and stable Postop Assessment: Adequate PO intake and No signs of nausea or vomiting Anesthetic complications: no    Raliegh Ip

## 2019-12-13 NOTE — Anesthesia Preprocedure Evaluation (Signed)
Anesthesia Evaluation  Patient identified by MRN, date of birth, ID band Patient awake    Reviewed: Allergy & Precautions, H&P , NPO status , Patient's Chart, lab work & pertinent test results  Airway Mallampati: II  TM Distance: >3 FB Neck ROM: full    Dental  (+) Edentulous Lower, Edentulous Upper   Pulmonary COPD,  COPD inhaler, Current Smoker and Patient abstained from smoking.,    Pulmonary exam normal breath sounds clear to auscultation       Cardiovascular hypertension, Normal cardiovascular exam Rhythm:regular Rate:Normal     Neuro/Psych    GI/Hepatic   Endo/Other    Renal/GU      Musculoskeletal   Abdominal   Peds  Hematology   Anesthesia Other Findings   Reproductive/Obstetrics                             Anesthesia Physical Anesthesia Plan  ASA: III  Anesthesia Plan: MAC   Post-op Pain Management:    Induction:   PONV Risk Score and Plan: 2 and Treatment may vary due to age or medical condition, TIVA and Midazolam  Airway Management Planned:   Additional Equipment:   Intra-op Plan:   Post-operative Plan:   Informed Consent: I have reviewed the patients History and Physical, chart, labs and discussed the procedure including the risks, benefits and alternatives for the proposed anesthesia with the patient or authorized representative who has indicated his/her understanding and acceptance.     Dental Advisory Given  Plan Discussed with: CRNA  Anesthesia Plan Comments:         Anesthesia Quick Evaluation

## 2019-12-13 NOTE — H&P (Signed)
All labs reviewed. Abnormal studies sent to patients PCP when indicated.  Previous H&P reviewed, patient examined, there are NO CHANGES.  Alicia Larabee Porfilio3/9/20219:51 AM

## 2019-12-13 NOTE — Op Note (Signed)
PREOPERATIVE DIAGNOSIS:  Nuclear sclerotic cataract of the left eye.   POSTOPERATIVE DIAGNOSIS:  Nuclear sclerotic cataract of the left eye.   OPERATIVE PROCEDURE:@   SURGEON:  Galen Manila, MD.   ANESTHESIA:  Anesthesiologist: Ranee Gosselin, MD CRNA: Jimmy Picket, CRNA  1.      Managed anesthesia care. 2.     0.53ml of Shugarcaine was instilled following the paracentesis   COMPLICATIONS:  None.   TECHNIQUE:   Stop and chop   DESCRIPTION OF PROCEDURE:  The patient was examined and consented in the preoperative holding area where the aforementioned topical anesthesia was applied to the left eye and then brought back to the Operating Room where the left eye was prepped and draped in the usual sterile ophthalmic fashion and a lid speculum was placed. A paracentesis was created with the side port blade and the anterior chamber was filled with viscoelastic. A near clear corneal incision was performed with the steel keratome. A continuous curvilinear capsulorrhexis was performed with a cystotome followed by the capsulorrhexis forceps. Hydrodissection and hydrodelineation were carried out with BSS on a blunt cannula. The lens was removed in a stop and chop  technique and the remaining cortical material was removed with the irrigation-aspiration handpiece. The capsular bag was inflated with viscoelastic and the Technis ZCB00 lens was placed in the capsular bag without complication. The remaining viscoelastic was removed from the eye with the irrigation-aspiration handpiece. The wounds were hydrated. The anterior chamber was flushed with BSS and the eye was inflated to physiologic pressure. 0.74ml Vigamox was placed in the anterior chamber. The wounds were found to be water tight. The eye was dressed with Combigan. The patient was given protective glasses to wear throughout the day and a shield with which to sleep tonight. The patient was also given drops with which to begin a drop regimen today and  will follow-up with me in one day. Implant Name Type Inv. Item Serial No. Manufacturer Lot No. LRB No. Used Action  LENS IOL DIOP 15.0 - [id number] Intraocular Lens LENS IOL DIOP 15.0 [id number] AMO  Left 1 Implanted    Procedure(s) with comments: CATARACT EXTRACTION PHACO AND INTRAOCULAR LENS PLACEMENT (IOC) LEFT (Left) - CDE 4.90 U/S 0:36.8  Electronically signed: Galen Manila 12/13/2019 10:16 AM

## 2019-12-13 NOTE — Anesthesia Procedure Notes (Signed)
Procedure Name: MAC Performed by: Arielys Wandersee, CRNA Pre-anesthesia Checklist: Patient identified, Emergency Drugs available, Suction available, Timeout performed and Patient being monitored Patient Re-evaluated:Patient Re-evaluated prior to induction Oxygen Delivery Method: Nasal cannula Placement Confirmation: positive ETCO2       

## 2019-12-14 ENCOUNTER — Encounter: Payer: Self-pay | Admitting: *Deleted

## 2019-12-21 ENCOUNTER — Other Ambulatory Visit: Payer: Self-pay | Admitting: Adult Health

## 2019-12-21 ENCOUNTER — Other Ambulatory Visit: Payer: Self-pay | Admitting: Internal Medicine

## 2019-12-21 DIAGNOSIS — J449 Chronic obstructive pulmonary disease, unspecified: Secondary | ICD-10-CM

## 2019-12-22 DIAGNOSIS — H2511 Age-related nuclear cataract, right eye: Secondary | ICD-10-CM | POA: Diagnosis not present

## 2019-12-22 DIAGNOSIS — J449 Chronic obstructive pulmonary disease, unspecified: Secondary | ICD-10-CM | POA: Diagnosis not present

## 2019-12-27 ENCOUNTER — Encounter: Payer: Self-pay | Admitting: Ophthalmology

## 2019-12-27 ENCOUNTER — Other Ambulatory Visit: Payer: Self-pay

## 2019-12-30 ENCOUNTER — Other Ambulatory Visit
Admission: RE | Admit: 2019-12-30 | Discharge: 2019-12-30 | Disposition: A | Discharge status: Home / Self Care | Payer: Medicare Other | Source: Ambulatory Visit | Attending: Ophthalmology | Admitting: Ophthalmology

## 2019-12-30 DIAGNOSIS — Z01812 Encounter for preprocedural laboratory examination: Secondary | ICD-10-CM | POA: Insufficient documentation

## 2019-12-30 DIAGNOSIS — Z20822 Contact with and (suspected) exposure to covid-19: Secondary | ICD-10-CM | POA: Diagnosis not present

## 2019-12-30 LAB — SARS CORONAVIRUS 2 (TAT 6-24 HRS): SARS Coronavirus 2: NEGATIVE

## 2020-01-02 NOTE — Anesthesia Preprocedure Evaluation (Addendum)
Anesthesia Evaluation  Patient identified by MRN, date of birth, ID band Patient awake    Reviewed: Allergy & Precautions, H&P , NPO status , Patient's Chart, lab work & pertinent test results  History of Anesthesia Complications Negative for: history of anesthetic complications  Airway Mallampati: II  TM Distance: >3 FB Neck ROM: full    Dental  (+) Edentulous Lower, Edentulous Upper   Pulmonary COPD,  COPD inhaler, Current Smoker and Patient abstained from smoking.,    Pulmonary exam normal breath sounds clear to auscultation       Cardiovascular hypertension, (-) angina(-) DOE Normal cardiovascular exam Rhythm:regular Rate:Normal     Neuro/Psych PSYCHIATRIC DISORDERS Anxiety    GI/Hepatic neg GERD  ,  Endo/Other    Renal/GU      Musculoskeletal   Abdominal   Peds  Hematology   Anesthesia Other Findings   Reproductive/Obstetrics                            Anesthesia Physical  Anesthesia Plan  ASA: III  Anesthesia Plan: MAC   Post-op Pain Management:    Induction: Intravenous  PONV Risk Score and Plan: 2 and Treatment may vary due to age or medical condition, TIVA and Midazolam  Airway Management Planned: Nasal Cannula  Additional Equipment:   Intra-op Plan:   Post-operative Plan:   Informed Consent: I have reviewed the patients History and Physical, chart, labs and discussed the procedure including the risks, benefits and alternatives for the proposed anesthesia with the patient or authorized representative who has indicated his/her understanding and acceptance.     Dental Advisory Given  Plan Discussed with: CRNA  Anesthesia Plan Comments:        Anesthesia Quick Evaluation

## 2020-01-02 NOTE — Discharge Instructions (Signed)

## 2020-01-03 ENCOUNTER — Ambulatory Visit
Admission: RE | Admit: 2020-01-03 | Discharge: 2020-01-03 | Disposition: A | Discharge status: Home / Self Care | Payer: Medicare Other | Attending: Ophthalmology | Admitting: Ophthalmology

## 2020-01-03 ENCOUNTER — Encounter
Admission: RE | Disposition: A | Discharge status: Home / Self Care | Payer: Self-pay | Source: Home / Self Care | Attending: Ophthalmology

## 2020-01-03 ENCOUNTER — Encounter: Payer: Self-pay | Admitting: Ophthalmology

## 2020-01-03 ENCOUNTER — Ambulatory Visit: Payer: Medicare Other | Admitting: Anesthesiology

## 2020-01-03 ENCOUNTER — Other Ambulatory Visit: Payer: Self-pay

## 2020-01-03 DIAGNOSIS — Z882 Allergy status to sulfonamides status: Secondary | ICD-10-CM | POA: Insufficient documentation

## 2020-01-03 DIAGNOSIS — I10 Essential (primary) hypertension: Secondary | ICD-10-CM | POA: Diagnosis not present

## 2020-01-03 DIAGNOSIS — Z9842 Cataract extraction status, left eye: Secondary | ICD-10-CM | POA: Insufficient documentation

## 2020-01-03 DIAGNOSIS — F410 Panic disorder [episodic paroxysmal anxiety] without agoraphobia: Secondary | ICD-10-CM | POA: Diagnosis not present

## 2020-01-03 DIAGNOSIS — K219 Gastro-esophageal reflux disease without esophagitis: Secondary | ICD-10-CM | POA: Insufficient documentation

## 2020-01-03 DIAGNOSIS — F17200 Nicotine dependence, unspecified, uncomplicated: Secondary | ICD-10-CM | POA: Diagnosis not present

## 2020-01-03 DIAGNOSIS — H2511 Age-related nuclear cataract, right eye: Secondary | ICD-10-CM | POA: Insufficient documentation

## 2020-01-03 DIAGNOSIS — J45909 Unspecified asthma, uncomplicated: Secondary | ICD-10-CM | POA: Diagnosis not present

## 2020-01-03 DIAGNOSIS — H25811 Combined forms of age-related cataract, right eye: Secondary | ICD-10-CM | POA: Diagnosis not present

## 2020-01-03 DIAGNOSIS — F419 Anxiety disorder, unspecified: Secondary | ICD-10-CM | POA: Insufficient documentation

## 2020-01-03 HISTORY — PX: CATARACT EXTRACTION W/PHACO: SHX586

## 2020-01-03 SURGERY — PHACOEMULSIFICATION, CATARACT, WITH IOL INSERTION
Anesthesia: Monitor Anesthesia Care | Site: Eye | Laterality: Right

## 2020-01-03 MED ORDER — LACTATED RINGERS IV SOLN: 100.0000 mL/h | INTRAVENOUS | Status: DC

## 2020-01-03 MED ORDER — LIDOCAINE HCL (PF) 2 % IJ SOLN
INTRAOCULAR | Status: DC | PRN
  Administered 2020-01-03: 2 mL

## 2020-01-03 MED ORDER — NA CHONDROIT SULF-NA HYALURON 40-17 MG/ML IO SOLN
INTRAOCULAR | Status: DC | PRN
  Administered 2020-01-03: 1 mL via INTRAOCULAR

## 2020-01-03 MED ORDER — ARMC OPHTHALMIC DILATING DROPS
1.0000 "application " | OPHTHALMIC | Status: DC | PRN
  Administered 2020-01-03 (×3): 1 via OPHTHALMIC

## 2020-01-03 MED ORDER — ACETAMINOPHEN 10 MG/ML IV SOLN: 1000.0000 mg | Freq: Once | INTRAVENOUS | Status: DC | PRN

## 2020-01-03 MED ORDER — TETRACAINE HCL 0.5 % OP SOLN
1.0000 [drp] | OPHTHALMIC | Status: DC | PRN
  Administered 2020-01-03 (×3): 1 [drp] via OPHTHALMIC

## 2020-01-03 MED ORDER — ONDANSETRON HCL 4 MG/2ML IJ SOLN: 4.0000 mg | Freq: Once | INTRAMUSCULAR | Status: DC | PRN

## 2020-01-03 MED ORDER — FENTANYL CITRATE (PF) 100 MCG/2ML IJ SOLN
INTRAMUSCULAR | Status: DC | PRN
  Administered 2020-01-03: 50 ug via INTRAVENOUS

## 2020-01-03 MED ORDER — EPINEPHRINE PF 1 MG/ML IJ SOLN
INTRAOCULAR | Status: DC | PRN
  Administered 2020-01-03: 51 mL via OPHTHALMIC

## 2020-01-03 MED ORDER — BRIMONIDINE TARTRATE-TIMOLOL 0.2-0.5 % OP SOLN
OPHTHALMIC | Status: DC | PRN
  Administered 2020-01-03: 1 [drp] via OPHTHALMIC

## 2020-01-03 MED ORDER — MIDAZOLAM HCL 2 MG/2ML IJ SOLN
INTRAMUSCULAR | Status: DC | PRN
  Administered 2020-01-03: 2 mg via INTRAVENOUS

## 2020-01-03 MED ORDER — MOXIFLOXACIN HCL 0.5 % OP SOLN
OPHTHALMIC | Status: DC | PRN
  Administered 2020-01-03: 0.2 mL via OPHTHALMIC

## 2020-01-03 SURGICAL SUPPLY — 18 items
CANNULA ANT/CHMB 27GA (MISCELLANEOUS) ×6 IMPLANT
DISSECTOR HYDRO NUCLEUS 50X22 (MISCELLANEOUS) ×3 IMPLANT
GLOVE SURG LX 8.0 MICRO (GLOVE) ×2
GLOVE SURG LX STRL 8.0 MICRO (GLOVE) ×1 IMPLANT
GLOVE SURG TRIUMPH 8.0 PF LTX (GLOVE) ×3 IMPLANT
GOWN STRL REUS W/ TWL LRG LVL3 (GOWN DISPOSABLE) ×2 IMPLANT
GOWN STRL REUS W/TWL LRG LVL3 (GOWN DISPOSABLE) ×4
LENS IOL TECNIS ITEC 17.5 (Intraocular Lens) ×3 IMPLANT
MARKER SKIN DUAL TIP RULER LAB (MISCELLANEOUS) ×3 IMPLANT
NEEDLE FILTER BLUNT 18X 1/2SAF (NEEDLE) ×2
NEEDLE FILTER BLUNT 18X1 1/2 (NEEDLE) ×1 IMPLANT
PACK EYE AFTER SURG (MISCELLANEOUS) ×3 IMPLANT
PACK OPTHALMIC (MISCELLANEOUS) ×3 IMPLANT
PACK PORFILIO (MISCELLANEOUS) ×3 IMPLANT
SYR 3ML LL SCALE MARK (SYRINGE) ×3 IMPLANT
SYR TB 1ML LUER SLIP (SYRINGE) ×3 IMPLANT
WATER STERILE IRR 250ML POUR (IV SOLUTION) ×3 IMPLANT
WIPE NON LINTING 3.25X3.25 (MISCELLANEOUS) ×3 IMPLANT

## 2020-01-03 NOTE — Anesthesia Postprocedure Evaluation (Signed)
Anesthesia Post Note  Patient: Alicia Rivera  Procedure(s) Performed: CATARACT EXTRACTION PHACO AND INTRAOCULAR LENS PLACEMENT (IOC) RIGHT 6.01 00:44.5 (Right Eye)     Patient location during evaluation: PACU Anesthesia Type: MAC Level of consciousness: awake and alert Pain management: pain level controlled Vital Signs Assessment: post-procedure vital signs reviewed and stable Respiratory status: spontaneous breathing, nonlabored ventilation, respiratory function stable and patient connected to nasal cannula oxygen Cardiovascular status: stable and blood pressure returned to baseline Postop Assessment: no apparent nausea or vomiting Anesthetic complications: no    Esperanza Madrazo A  Forrest Demuro

## 2020-01-03 NOTE — Transfer of Care (Signed)
Immediate Anesthesia Transfer of Care Note  Patient: Alicia Rivera  Procedure(s) Performed: CATARACT EXTRACTION PHACO AND INTRAOCULAR LENS PLACEMENT (IOC) RIGHT (Right Eye)  Patient Location: PACU  Anesthesia Type: MAC  Level of Consciousness: awake, alert  and patient cooperative  Airway and Oxygen Therapy: Patient Spontanous Breathing and Patient connected to supplemental oxygen  Post-op Assessment: Post-op Vital signs reviewed, Patient's Cardiovascular Status Stable, Respiratory Function Stable, Patent Airway and No signs of Nausea or vomiting  Post-op Vital Signs: Reviewed and stable  Complications: No apparent anesthesia complications

## 2020-01-03 NOTE — H&P (Signed)
All labs reviewed. Abnormal studies sent to patients PCP when indicated.  Previous H&P reviewed, patient examined, there are NO CHANGES.  Alicia Hsiao Porfilio3/30/202111:06 AM

## 2020-01-03 NOTE — Op Note (Signed)
PREOPERATIVE DIAGNOSIS:  Nuclear sclerotic cataract of the right eye.   POSTOPERATIVE DIAGNOSIS:  H25.11 Cataract   OPERATIVE PROCEDURE: Procedure(s): CATARACT EXTRACTION PHACO AND INTRAOCULAR LENS PLACEMENT (IOC) RIGHT 6.01 00:44.5   SURGEON:  Galen Manila, MD.   ANESTHESIA:  Anesthesiologist: Heniser, Burman Foster, MD CRNA: Michaele Offer, CRNA  1.      Managed anesthesia care. 2.      0.33ml of Shugarcaine was instilled in the eye following the paracentesis.   COMPLICATIONS:  None.   TECHNIQUE:   Stop and chop   DESCRIPTION OF PROCEDURE:  The patient was examined and consented in the preoperative holding area where the aforementioned topical anesthesia was applied to the right eye and then brought back to the Operating Room where the right eye was prepped and draped in the usual sterile ophthalmic fashion and a lid speculum was placed. A paracentesis was created with the side port blade and the anterior chamber was filled with viscoelastic. A near clear corneal incision was performed with the steel keratome. A continuous curvilinear capsulorrhexis was performed with a cystotome followed by the capsulorrhexis forceps. Hydrodissection and hydrodelineation were carried out with BSS on a blunt cannula. The lens was removed in a stop and chop  technique and the remaining cortical material was removed with the irrigation-aspiration handpiece. The capsular bag was inflated with viscoelastic and the Technis [id number]  lens was placed in the capsular bag without complication. The remaining viscoelastic was removed from the eye with the irrigation-aspiration handpiece. The wounds were hydrated. The anterior chamber was flushed with BSS and the eye was inflated to physiologic pressure. 0.27ml of Vigamox was placed in the anterior chamber. The wounds were found to be water tight. The eye was dressed with Combigan. The patient was given protective glasses to wear throughout the day and a shield with which to  sleep tonight. The patient was also given drops with which to begin a drop regimen today and will follow-up with me in one day. Implant Name Type Inv. Item Serial No. Manufacturer Lot No. LRB No. Used Action  LENS IOL DIOP 17.5 - [id number] Intraocular Lens LENS IOL DIOP 17.5 [id number] AMO  Right 1 Implanted   Procedure(s): CATARACT EXTRACTION PHACO AND INTRAOCULAR LENS PLACEMENT (IOC) RIGHT 6.01 00:44.5 (Right)  Electronically signed: Galen Manila 01/03/2020 11:29 AM

## 2020-01-04 ENCOUNTER — Encounter: Payer: Self-pay | Admitting: *Deleted

## 2020-01-17 ENCOUNTER — Telehealth: Payer: Self-pay

## 2020-01-17 DIAGNOSIS — E871 Hypo-osmolality and hyponatremia: Secondary | ICD-10-CM | POA: Diagnosis not present

## 2020-01-17 DIAGNOSIS — Z882 Allergy status to sulfonamides status: Secondary | ICD-10-CM | POA: Diagnosis not present

## 2020-01-17 DIAGNOSIS — F410 Panic disorder [episodic paroxysmal anxiety] without agoraphobia: Secondary | ICD-10-CM | POA: Diagnosis present

## 2020-01-17 DIAGNOSIS — J9601 Acute respiratory failure with hypoxia: Secondary | ICD-10-CM | POA: Diagnosis not present

## 2020-01-17 DIAGNOSIS — Z87891 Personal history of nicotine dependence: Secondary | ICD-10-CM | POA: Diagnosis not present

## 2020-01-17 DIAGNOSIS — Z7951 Long term (current) use of inhaled steroids: Secondary | ICD-10-CM | POA: Diagnosis not present

## 2020-01-17 DIAGNOSIS — U071 COVID-19: Secondary | ICD-10-CM | POA: Diagnosis present

## 2020-01-17 DIAGNOSIS — Z86718 Personal history of other venous thrombosis and embolism: Secondary | ICD-10-CM | POA: Diagnosis not present

## 2020-01-17 DIAGNOSIS — J441 Chronic obstructive pulmonary disease with (acute) exacerbation: Secondary | ICD-10-CM | POA: Diagnosis present

## 2020-01-17 DIAGNOSIS — Z7982 Long term (current) use of aspirin: Secondary | ICD-10-CM | POA: Diagnosis not present

## 2020-01-17 DIAGNOSIS — Z8673 Personal history of transient ischemic attack (TIA), and cerebral infarction without residual deficits: Secondary | ICD-10-CM | POA: Diagnosis not present

## 2020-01-17 DIAGNOSIS — J9612 Chronic respiratory failure with hypercapnia: Secondary | ICD-10-CM | POA: Diagnosis not present

## 2020-01-17 DIAGNOSIS — Z79899 Other long term (current) drug therapy: Secondary | ICD-10-CM | POA: Diagnosis not present

## 2020-01-17 DIAGNOSIS — F419 Anxiety disorder, unspecified: Secondary | ICD-10-CM | POA: Diagnosis not present

## 2020-01-17 DIAGNOSIS — Z9981 Dependence on supplemental oxygen: Secondary | ICD-10-CM | POA: Diagnosis not present

## 2020-01-17 DIAGNOSIS — I10 Essential (primary) hypertension: Secondary | ICD-10-CM | POA: Diagnosis present

## 2020-01-17 DIAGNOSIS — J984 Other disorders of lung: Secondary | ICD-10-CM | POA: Diagnosis not present

## 2020-01-17 DIAGNOSIS — E222 Syndrome of inappropriate secretion of antidiuretic hormone: Secondary | ICD-10-CM | POA: Diagnosis present

## 2020-01-17 DIAGNOSIS — J449 Chronic obstructive pulmonary disease, unspecified: Secondary | ICD-10-CM | POA: Diagnosis not present

## 2020-01-17 DIAGNOSIS — J9621 Acute and chronic respiratory failure with hypoxia: Secondary | ICD-10-CM | POA: Diagnosis present

## 2020-01-17 DIAGNOSIS — F411 Generalized anxiety disorder: Secondary | ICD-10-CM | POA: Diagnosis not present

## 2020-01-17 NOTE — Telephone Encounter (Signed)
Pt called that she having chills,fever,cough and tired advised her go for ovid test and we put her for tomorrow video call apt and also advised her take tylenol and OTC cough medicine  And if worse need go to ED

## 2020-01-18 ENCOUNTER — Ambulatory Visit: Payer: Medicare Other | Admitting: Adult Health

## 2020-01-19 ENCOUNTER — Telehealth: Payer: Self-pay

## 2020-01-19 MED ORDER — FLUTICASONE PROPIONATE 50 MCG/ACT NA SUSP
1.00 | NASAL | Status: DC
Start: 2020-01-21 — End: 2020-01-19

## 2020-01-19 MED ORDER — MONTELUKAST SODIUM 10 MG PO TABS
10.00 | ORAL_TABLET | ORAL | Status: DC
Start: 2020-01-20 — End: 2020-01-19

## 2020-01-19 MED ORDER — DEXAMETHASONE 4 MG PO TABS
6.00 | ORAL_TABLET | ORAL | Status: DC
Start: 2020-01-21 — End: 2020-01-19

## 2020-01-19 MED ORDER — ONDANSETRON HCL 4 MG/2ML IJ SOLN
4.00 | INTRAMUSCULAR | Status: DC
Start: ? — End: 2020-01-19

## 2020-01-19 MED ORDER — GUAIFENESIN 100 MG/5ML PO SYRP
200.00 | ORAL_SOLUTION | ORAL | Status: DC
Start: ? — End: 2020-01-19

## 2020-01-19 MED ORDER — DIPHENHYDRAMINE HCL 25 MG PO CAPS
25.00 | ORAL_CAPSULE | ORAL | Status: DC
Start: ? — End: 2020-01-19

## 2020-01-19 MED ORDER — ARMOUR THYROID 30 MG PO TABS
2.00 | ORAL_TABLET | ORAL | Status: DC
Start: ? — End: 2020-01-19

## 2020-01-19 MED ORDER — ACETAMINOPHEN 325 MG PO TABS
650.00 | ORAL_TABLET | ORAL | Status: DC
Start: ? — End: 2020-01-19

## 2020-01-19 MED ORDER — FLUTICASONE FUROATE-VILANTEROL 200-25 MCG/INH IN AEPB
1.00 | INHALATION_SPRAY | RESPIRATORY_TRACT | Status: DC
Start: 2020-01-21 — End: 2020-01-19

## 2020-01-19 MED ORDER — METOPROLOL SUCCINATE ER 50 MG PO TB24
50.00 | ORAL_TABLET | ORAL | Status: DC
Start: 2020-01-21 — End: 2020-01-19

## 2020-01-19 MED ORDER — OLANZAPINE-FLUOXETINE HCL 6-50 MG PO CAPS
3.00 | ORAL_CAPSULE | ORAL | Status: DC
Start: 2020-01-20 — End: 2020-01-19

## 2020-01-19 MED ORDER — Medication
100.00 | Status: DC
Start: 2020-01-21 — End: 2020-01-19

## 2020-01-19 MED ORDER — ASPIRIN 81 MG PO TBEC
81.00 | DELAYED_RELEASE_TABLET | ORAL | Status: DC
Start: 2020-01-21 — End: 2020-01-19

## 2020-01-19 MED ORDER — ENOXAPARIN SODIUM 40 MG/0.4ML ~~LOC~~ SOLN
40.00 | SUBCUTANEOUS | Status: DC
Start: 2020-01-20 — End: 2020-01-19

## 2020-01-19 MED ORDER — UMECLIDINIUM BROMIDE 62.5 MCG/INH IN AEPB
1.00 | INHALATION_SPRAY | RESPIRATORY_TRACT | Status: DC
Start: 2020-01-21 — End: 2020-01-19

## 2020-01-19 MED ORDER — QUETIAPINE FUMARATE 25 MG PO TABS
12.50 | ORAL_TABLET | ORAL | Status: DC
Start: ? — End: 2020-01-19

## 2020-01-19 NOTE — Telephone Encounter (Signed)
Called lmom informing patient of appointment on 01/23/2020.klh 

## 2020-01-20 DIAGNOSIS — Z9981 Dependence on supplemental oxygen: Secondary | ICD-10-CM | POA: Diagnosis not present

## 2020-01-20 DIAGNOSIS — U071 COVID-19: Secondary | ICD-10-CM | POA: Diagnosis not present

## 2020-01-20 DIAGNOSIS — F411 Generalized anxiety disorder: Secondary | ICD-10-CM | POA: Diagnosis not present

## 2020-01-20 DIAGNOSIS — E871 Hypo-osmolality and hyponatremia: Secondary | ICD-10-CM | POA: Diagnosis not present

## 2020-01-20 DIAGNOSIS — J9621 Acute and chronic respiratory failure with hypoxia: Secondary | ICD-10-CM | POA: Diagnosis not present

## 2020-01-20 DIAGNOSIS — I10 Essential (primary) hypertension: Secondary | ICD-10-CM | POA: Diagnosis not present

## 2020-01-23 ENCOUNTER — Ambulatory Visit (INDEPENDENT_AMBULATORY_CARE_PROVIDER_SITE_OTHER): Payer: Medicare Other | Admitting: Adult Health

## 2020-01-23 ENCOUNTER — Encounter: Payer: Self-pay | Admitting: Adult Health

## 2020-01-23 VITALS — BP 132/72 | HR 63 | Temp 98.6°F | Ht 65.0 in | Wt 182.0 lb

## 2020-01-23 DIAGNOSIS — F17218 Nicotine dependence, cigarettes, with other nicotine-induced disorders: Secondary | ICD-10-CM

## 2020-01-23 DIAGNOSIS — I10 Essential (primary) hypertension: Secondary | ICD-10-CM

## 2020-01-23 DIAGNOSIS — F411 Generalized anxiety disorder: Secondary | ICD-10-CM

## 2020-01-23 DIAGNOSIS — U071 COVID-19: Secondary | ICD-10-CM | POA: Diagnosis not present

## 2020-01-23 DIAGNOSIS — J449 Chronic obstructive pulmonary disease, unspecified: Secondary | ICD-10-CM | POA: Diagnosis not present

## 2020-01-23 MED ORDER — ALPRAZOLAM 0.25 MG PO TABS: ORAL_TABLET | ORAL | 0 refills | Status: DC

## 2020-01-23 NOTE — Progress Notes (Signed)
[facility] [address] [place], [place] [address]  Internal MEDICINE  Telephone Visit  Patient Name: Alicia Rivera  [id number]  [id number]  Date of Service: 01/23/2020 ((( Transtional Care Management))) I connected with the patient at 1035 by telephone and verified the patients identity using two identifiers.   I discussed the limitations, risks, security and privacy concerns of performing an evaluation and management service by telephone and the availability of in person appointments. I also discussed with the patient that there may be a patient responsible charge related to the service.  The patient expressed understanding and agrees to proceed.    Chief Complaint  Patient presents with  . Telephone Screen  . Telephone Assessment  . Hospitalization Follow-up    covid positive    HPI  Pt is seen today via telephone telephone.  She reports she went to [facility] hospital on 01/17/20 due to fever of 103.  She denies any other symptoms, other than feeling fatigue.  She was admitted and given oxygen via [place] for 3 nights.  She was discharged on 01/20/20. She was not sent home with medications.  She did receive antivirals, antibiotics and steroids while in the hospital.  She reports overall she is doing very well now.  She reports very minimal cough.  Denies any new symptoms.  She has not had a fever since discharge. She remains in quarantine as recommended.   Brief hospital course copied from D. [facility] Discharge summary on 01/20/20  "Hospital Course:  Alicia Rivera is a 64 y.o. female admitted at [facility] 01/17/2020 for COVID-19 infection complicated by hypoxemic respiratory failure.  Date of symptom onset: 04/12 SARS-COV2 PCR: positive, date: 04/13 Therapy: Decadron, Remdesivir, Supplemental Oxygen   During the hospitalization the patient was supported for their COVID 19 infection. The patient was treated with 2-3 L O2 by nasal canula at maximum during hospitalization, and  did not require hi-flow nasal canula. They did not need intubation with mechanical ventilation. They were or were not treated with antibiotics. At day of discharge, their respiratory rate was normal, their heart rate was normal, they were not hypoxic, their blood pressure was not hypotensive, they were ambulatory, were on room air, they did pass the 6 minute O2 screening test, and they were tolerating a diet. Their trend of pertinent COVID laboratory markers is shown below and were downtrending."     Current Medication: Outpatient Encounter Medications as of 01/23/2020  Medication Sig  . albuterol (PROVENTIL) (2.5 MG/3ML) 0.083% nebulizer solution Take 3 mLs (2.5 mg total) by nebulization every 6 (six) hours as needed for wheezing or shortness of breath.  Marland Kitchen albuterol (VENTOLIN HFA) 108 (90 Base) MCG/ACT inhaler INHALE 2 PUFFS INTO THE LUNGS EVERY 4 HOURS  . ALPRAZolam (XANAX) 0.25 MG tablet Half to one tab po qd prn for anxiety  . aspirin EC 81 MG tablet Take 81 mg by mouth daily.  . budesonide-formoterol (SYMBICORT) 160-4.5 MCG/ACT inhaler INHALE 1 PUFF BY MOUTH TWICE DAILY  . cetirizine (ZYRTEC) 10 MG tablet TAKE 1 TABLET(10 MG) BY MOUTH DAILY  . DULoxetine (CYMBALTA) 30 MG capsule Take 1 capsule (30 mg total) by mouth daily.  Marland Kitchen guaiFENesin-dextromethorphan (ROBITUSSIN DM) 100-10 MG/5ML syrup Take 5 mLs by mouth every 4 (four) hours as needed for cough.  Marland Kitchen ipratropium (ATROVENT) 0.02 % nebulizer solution Take 2.5 mLs (0.5 mg total) by nebulization every 6 (six) hours as needed for wheezing or shortness of breath.  . losartan (COZAAR) 25 MG tablet  Take 1 tablet (25 mg total) by mouth daily.  . metoprolol succinate (TOPROL-XL) 50 MG 24 hr tablet Take with or immediately following a meal.  . montelukast (SINGULAIR) 10 MG tablet TAKE 1 TABLET(10 MG) BY MOUTH AT BEDTIME  . QUEtiapine (SEROQUEL) 25 MG tablet TAKE 1 TABLET(25 MG) BY MOUTH DAILY AS NEEDED  . roflumilast (DALIRESP) 500 MCG TABS tablet  Take 1 tablet (500 mcg total) by mouth daily.  Marland Kitchen SPIRIVA HANDIHALER 18 MCG inhalation capsule PLACE 1 C INTO INHALER AND INHALE D.  . [DISCONTINUED] ALPRAZolam (XANAX) 0.25 MG tablet Half to one tab po qd prn for anxiety   No facility-administered encounter medications on file as of 01/23/2020.    Surgical History: Past Surgical History:  Procedure Laterality Date  . CATARACT EXTRACTION W/PHACO Left 12/13/2019   Procedure: CATARACT EXTRACTION PHACO AND INTRAOCULAR LENS PLACEMENT (IOC) LEFT;  Surgeon: Galen Manila, MD;  Location: [facility];  Service: Ophthalmology;  Laterality: Left;  CDE 4.90 U/S 0:36.8  . CATARACT EXTRACTION W/PHACO Right 01/03/2020   Procedure: CATARACT EXTRACTION PHACO AND INTRAOCULAR LENS PLACEMENT (IOC) RIGHT 6.01 00:44.5;  Surgeon: Galen Manila, MD;  Location: [facility];  Service: Ophthalmology;  Laterality: Right;    Medical History: Past Medical History:  Diagnosis Date  . Anxiety   . COPD (chronic obstructive pulmonary disease) (HCC)   . HTN (hypertension)   . Pneumonia 09/18/2017  . Tobacco abuse     Family History: Family History  Problem Relation Age of Onset  . Emphysema Brother   . Lung disease Mother   . Heart disease Father     Social History   Socioeconomic History  . Marital status: Single    Spouse name: Not on file  . Number of children: Not on file  . Years of education: Not on file  . Highest education level: Not on file  Occupational History  . Occupation: disabled  Tobacco Use  . Smoking status: Current Some Day Smoker    Years: 50.00  . Smokeless tobacco: Never Used  . Tobacco comment: 5 cigs/week.   Substance and Sexual Activity  . Alcohol use: No  . Drug use: No  . Sexual activity: Never  Other Topics Concern  . Not on file  Social History Narrative  . Not on file   Social Determinants of Health   Financial Resource Strain:   . Difficulty of Paying Living Expenses:   Food Insecurity:    . Worried About Programme researcher, broadcasting/film/video in the Last Year:   . Barista in the Last Year:   Transportation Needs:   . Freight forwarder (Medical):   Marland Kitchen Lack of Transportation (Non-Medical):   Physical Activity:   . Days of Exercise per Week:   . Minutes of Exercise per Session:   Stress:   . Feeling of Stress :   Social Connections:   . Frequency of Communication with Friends and Family:   . Frequency of Social Gatherings with Friends and Family:   . Attends Religious Services:   . Active Member of Clubs or Organizations:   . Attends Banker Meetings:   Marland Kitchen Marital Status:   Intimate Partner Violence:   . Fear of Current or Ex-Partner:   . Emotionally Abused:   Marland Kitchen Physically Abused:   . Sexually Abused:       Review of Systems  Constitutional: Negative for chills, fatigue and unexpected weight change.  HENT: Negative for congestion, rhinorrhea, sneezing and  sore throat.   Eyes: Negative for photophobia, pain and redness.  Respiratory: Negative for cough, chest tightness and shortness of breath.   Cardiovascular: Negative for chest pain and palpitations.  Gastrointestinal: Negative for abdominal pain, constipation, diarrhea, nausea and vomiting.  Endocrine: Negative.   Genitourinary: Negative for dysuria and frequency.  Musculoskeletal: Negative for arthralgias, back pain, joint swelling and neck pain.  Skin: Negative for rash.  Allergic/Immunologic: Negative.   Neurological: Negative for tremors and numbness.  Hematological: Negative for adenopathy. Does not bruise/bleed easily.  Psychiatric/Behavioral: Negative for behavioral problems and sleep disturbance. The patient is not nervous/anxious.     Vital Signs: BP 132/72   Pulse 63   Temp 98.6 F (37 C)   Ht 5\' 5"  (1.651 m)   Wt 182 lb (82.6 kg)   SpO2 96%   BMI 30.29 kg/m    Observation/Objective:  Well sounding, NAD noted.    Assessment/Plan: 1. COVID-19 Recovering, no symptoms at this  time. Treated in hospital.  Remains in quarantine.   2. Generalized anxiety disorder Refilled xanax at this time.  Reviewed risks and possible side effects associated with taking opiates, benzodiazepines and other CNS depressants. Combination of these could cause dizziness and drowsiness. Advised patient not to drive or operate machinery when taking these medications, as patient's and other's life can be at risk and will have consequences. Patient verbalized understanding in this matter. Dependence and abuse for these drugs will be monitored closely. A Controlled substance policy and procedure is on file which allows [facility] to order a urine drug screen test at any visit. Patient understands and agrees with the plan - ALPRAZolam (XANAX) 0.25 MG tablet; Half to one tab po qd prn for anxiety  Dispense: 20 tablet; Refill: 0  3. Obstructive chronic bronchitis without exacerbation ([place]) Stable, continue to use medications as discussed.   4. Essential hypertension, benign Controlled, continue present management.   5. Nicotine dependence, cigarettes, with other nicotine-induced disorders Smoking cessation counseling: 1. Pt acknowledges the risks of long term smoking, she will try to quite smoking. 2. Options for different medications including nicotine products, chewing gum, patch etc, Wellbutrin and Chantix is discussed 3. Goal and date of compete cessation is discussed 4. Total time spent in smoking cessation is 15 min.   General Counseling: Vung verbalizes understanding of the findings of today's phone visit and agrees with plan of treatment. I have discussed any further diagnostic evaluation that may be needed or ordered today. We also reviewed her medications today. she has been encouraged to call the office with any questions or concerns that should arise related to todays visit.    No orders of the defined types were placed in this encounter.   Meds ordered this encounter   Medications  . ALPRAZolam (XANAX) 0.25 MG tablet    Sig: Half to one tab po qd prn for anxiety    Dispense:  20 tablet    Refill:  0    Time spent: [place] AGNP-C Internal medicine

## 2020-02-28 ENCOUNTER — Other Ambulatory Visit: Payer: Self-pay | Admitting: Internal Medicine

## 2020-02-28 DIAGNOSIS — J449 Chronic obstructive pulmonary disease, unspecified: Secondary | ICD-10-CM

## 2020-02-29 ENCOUNTER — Telehealth: Payer: Self-pay

## 2020-02-29 NOTE — Telephone Encounter (Signed)
Authorization was approved for BUDESONIDE 160/4.5MCG(120INH) COVERAGE DATES ARE 01/30/2020 TO 02/28/2021 [place]

## 2020-02-29 NOTE — Telephone Encounter (Signed)
Authorization for ALBUTEROL HFA (200 PUFFS) WAS APPROVED FROM 01/30/2020 TO 02/28/2021 SL

## 2020-03-04 ENCOUNTER — Other Ambulatory Visit: Payer: Self-pay | Admitting: Adult Health

## 2020-03-04 DIAGNOSIS — F411 Generalized anxiety disorder: Secondary | ICD-10-CM

## 2020-03-16 ENCOUNTER — Other Ambulatory Visit: Payer: Self-pay

## 2020-03-16 DIAGNOSIS — I10 Essential (primary) hypertension: Secondary | ICD-10-CM

## 2020-03-16 MED ORDER — METOPROLOL SUCCINATE ER 50 MG PO TB24: ORAL_TABLET | ORAL | 2 refills | Status: DC

## 2020-03-28 ENCOUNTER — Telehealth: Payer: Self-pay

## 2020-03-28 NOTE — Telephone Encounter (Signed)
Authorization for Spiriva CAPS 30S &Handihaler approved from 02/27/2020 through 03/28/2021 SL

## 2020-04-03 ENCOUNTER — Other Ambulatory Visit: Payer: Self-pay | Admitting: Adult Health

## 2020-04-03 DIAGNOSIS — J449 Chronic obstructive pulmonary disease, unspecified: Secondary | ICD-10-CM

## 2020-04-04 ENCOUNTER — Other Ambulatory Visit: Payer: Self-pay

## 2020-04-04 DIAGNOSIS — J449 Chronic obstructive pulmonary disease, unspecified: Secondary | ICD-10-CM

## 2020-04-04 MED ORDER — ALBUTEROL SULFATE (2.5 MG/3ML) 0.083% IN NEBU: 2.5000 mg | INHALATION_SOLUTION | Freq: Four times a day (QID) | RESPIRATORY_TRACT | 2 refills | Status: DC | PRN

## 2020-06-02 ENCOUNTER — Other Ambulatory Visit: Payer: Self-pay | Admitting: Adult Health

## 2020-06-02 DIAGNOSIS — I10 Essential (primary) hypertension: Secondary | ICD-10-CM

## 2020-06-22 ENCOUNTER — Telehealth: Payer: Self-pay

## 2020-06-22 NOTE — Telephone Encounter (Signed)
Confirmed and screened for OV on 9/21 

## 2020-06-26 ENCOUNTER — Encounter: Payer: Self-pay | Admitting: Hospice and Palliative Medicine

## 2020-06-26 ENCOUNTER — Other Ambulatory Visit: Payer: Self-pay

## 2020-06-26 ENCOUNTER — Ambulatory Visit (INDEPENDENT_AMBULATORY_CARE_PROVIDER_SITE_OTHER): Payer: Medicare Other | Admitting: Hospice and Palliative Medicine

## 2020-06-26 DIAGNOSIS — I700 Atherosclerosis of aorta: Secondary | ICD-10-CM | POA: Diagnosis not present

## 2020-06-26 DIAGNOSIS — J449 Chronic obstructive pulmonary disease, unspecified: Secondary | ICD-10-CM

## 2020-06-26 DIAGNOSIS — F411 Generalized anxiety disorder: Secondary | ICD-10-CM | POA: Diagnosis not present

## 2020-06-26 DIAGNOSIS — I10 Essential (primary) hypertension: Secondary | ICD-10-CM | POA: Diagnosis not present

## 2020-06-26 DIAGNOSIS — Z0001 Encounter for general adult medical examination with abnormal findings: Secondary | ICD-10-CM

## 2020-06-26 DIAGNOSIS — R300 Dysuria: Secondary | ICD-10-CM

## 2020-06-26 DIAGNOSIS — J42 Unspecified chronic bronchitis: Secondary | ICD-10-CM

## 2020-06-26 MED ORDER — ALPRAZOLAM 0.25 MG PO TABS: ORAL_TABLET | ORAL | 0 refills | Status: DC

## 2020-06-26 MED ORDER — LOSARTAN POTASSIUM-HCTZ 50-12.5 MG PO TABS: 1.0000 | ORAL_TABLET | Freq: Every day | ORAL | 3 refills | Status: DC

## 2020-06-26 MED ORDER — ALBUTEROL SULFATE (2.5 MG/3ML) 0.083% IN NEBU: 2.5000 mg | INHALATION_SOLUTION | Freq: Four times a day (QID) | RESPIRATORY_TRACT | 2 refills | Status: DC | PRN

## 2020-06-26 MED ORDER — IPRATROPIUM BROMIDE 0.02 % IN SOLN: 0.5000 mg | Freq: Four times a day (QID) | RESPIRATORY_TRACT | 2 refills | Status: DC | PRN

## 2020-06-26 MED ORDER — ROSUVASTATIN CALCIUM 10 MG PO TABS: 10.0000 mg | ORAL_TABLET | Freq: Every day | ORAL | 3 refills | Status: DC

## 2020-06-26 NOTE — Progress Notes (Signed)
[facility] [address] [place], [place] [address]  Internal MEDICINE  Office Visit Note  Patient Name: Alicia Rivera  [id number]  [id number]  Date of Service: 06/26/2020  Chief Complaint  Patient presents with  . Medicare Wellness  . Hypertension  . Quality Metric Gaps    mammogram, colonoscopy, tetnaus,covid  . aortic atherosclerosis      HPI Pt is here for routine health maintenance examination Overall she feels that her health has been good, she is strictly adhering to COVID19 guidelines with avoiding public places with large crowds, she mainly stays at home and is only surrounded by her family She does constantly worry and stress about her brother that is not in good health, she lives close to him and checks on him frequently She has worked on smoking cessation, she is down to only smoking about 5 cigarettes per week, reports her breathing has been good, no complications Mammogram-Refuses, she is not interested in having another mammogram at this time Colonoscopy-Refuses, she is not interested in having another colonoscopy at this time  She reports that her eating habits have not changed, no change in her weight She sleeps well each night, averaging about 7-8 hours per night, denies snoring, waking up with headaches and feels rested through the day She remains active by doing housework and works outside  Current Medication: Outpatient Encounter Medications as of 06/26/2020  Medication Sig  . albuterol (VENTOLIN HFA) 108 (90 Base) MCG/ACT inhaler INHALE 1 TO 2 PUFFS INTO THE LUNGS EVERY 4 HOURS AS NEEDED FOR WHEEZING OR SHORTNESS OF BREATH  . ALPRAZolam (XANAX) 0.25 MG tablet Half to one tab po qd prn for anxiety  . aspirin EC 81 MG tablet Take 81 mg by mouth daily.  . budesonide-formoterol (SYMBICORT) 160-4.5 MCG/ACT inhaler INHALE 1 PUFF BY MOUTH TWICE DAILY  . cetirizine (ZYRTEC) 10 MG tablet TAKE 1 TABLET(10 MG) BY MOUTH DAILY  . DULoxetine (CYMBALTA) 30  MG capsule Take 1 capsule (30 mg total) by mouth daily.  Marland Kitchen guaiFENesin-dextromethorphan (ROBITUSSIN DM) 100-10 MG/5ML syrup Take 5 mLs by mouth every 4 (four) hours as needed for cough.  . montelukast (SINGULAIR) 10 MG tablet TAKE 1 TABLET(10 MG) BY MOUTH AT BEDTIME  . QUEtiapine (SEROQUEL) 25 MG tablet TAKE 1 TABLET(25 MG) BY MOUTH DAILY AS NEEDED  . roflumilast (DALIRESP) 500 MCG TABS tablet Take 1 tablet (500 mcg total) by mouth daily.  Marland Kitchen tiotropium (SPIRIVA) 18 MCG inhalation capsule Place 18 mcg into inhaler and inhale daily.  . [DISCONTINUED] albuterol (PROVENTIL) (2.5 MG/3ML) 0.083% nebulizer solution Take 3 mLs (2.5 mg total) by nebulization every 6 (six) hours as needed for wheezing or shortness of breath. DX J44.9  . [DISCONTINUED] ALPRAZolam (XANAX) 0.25 MG tablet Half to one tab po qd prn for anxiety  . [DISCONTINUED] ipratropium (ATROVENT) 0.02 % nebulizer solution Take 2.5 mLs (0.5 mg total) by nebulization every 6 (six) hours as needed for wheezing or shortness of breath.  . [DISCONTINUED] losartan (COZAAR) 25 MG tablet TAKE 1 TABLET(25 MG) BY MOUTH DAILY  . [DISCONTINUED] metoprolol succinate (TOPROL-XL) 50 MG 24 hr tablet Take with or immediately following a meal.  . losartan-hydrochlorothiazide (HYZAAR) 50-12.5 MG tablet Take 1 tablet by mouth daily.  . rosuvastatin (CRESTOR) 10 MG tablet Take 1 tablet (10 mg total) by mouth daily.   No facility-administered encounter medications on file as of 06/26/2020.    Surgical History: Past Surgical History:  Procedure Laterality Date  . CATARACT EXTRACTION W/PHACO Left  12/13/2019   Procedure: CATARACT EXTRACTION PHACO AND INTRAOCULAR LENS PLACEMENT (IOC) LEFT;  Surgeon: Galen Manila, MD;  Location: [facility];  Service: Ophthalmology;  Laterality: Left;  CDE 4.90 U/S 0:36.8  . CATARACT EXTRACTION W/PHACO Right 01/03/2020   Procedure: CATARACT EXTRACTION PHACO AND INTRAOCULAR LENS PLACEMENT (IOC) RIGHT 6.01 00:44.5;   Surgeon: Galen Manila, MD;  Location: [facility];  Service: Ophthalmology;  Laterality: Right;    Medical History: Past Medical History:  Diagnosis Date  . Anxiety   . COPD (chronic obstructive pulmonary disease) (HCC)   . HTN (hypertension)   . Pneumonia 09/18/2017  . Tobacco abuse     Family History: Family History  Problem Relation Age of Onset  . Emphysema Brother   . Lung disease Mother   . Heart disease Father    Review of Systems  Constitutional: Negative for chills, diaphoresis and fatigue.  HENT: Negative for ear pain, postnasal drip and sinus pressure.   Eyes: Negative for photophobia, discharge, redness, itching and visual disturbance.  Respiratory: Negative for cough, shortness of breath and wheezing.   Cardiovascular: Negative for chest pain, palpitations and leg swelling.  Gastrointestinal: Negative for abdominal pain, constipation, diarrhea, nausea and vomiting.  Genitourinary: Negative for dysuria and flank pain.  Musculoskeletal: Negative for arthralgias, back pain, gait problem and neck pain.  Skin: Negative for color change.  Allergic/Immunologic: Negative for environmental allergies and food allergies.  Neurological: Negative for dizziness and headaches.  Hematological: Does not bruise/bleed easily.  Psychiatric/Behavioral: Negative for agitation, behavioral problems (depression) and hallucinations.   Vital Signs: BP (!) 161/86   Pulse 67   Temp (!) 97.5 F (36.4 C)   Resp 16   Ht 5\' 5"  (1.651 m)   Wt 185 lb (83.9 kg)   SpO2 98%   BMI 30.79 kg/m   Physical Exam Vitals reviewed.  Constitutional:      Appearance: Normal appearance.  HENT:     Nose: Nose normal.     Mouth/Throat:     Mouth: Mucous membranes are moist.  Cardiovascular:     Rate and Rhythm: Normal rate and regular rhythm.     Pulses: Normal pulses.     Heart sounds: Normal heart sounds.  Pulmonary:     Effort: Pulmonary effort is normal.     Breath sounds:  Normal breath sounds.  Abdominal:     General: Abdomen is flat.     Palpations: Abdomen is soft.  Musculoskeletal:        General: Normal range of motion.     Cervical back: Normal range of motion.  Skin:    General: Skin is warm.  Neurological:     General: No focal deficit present.     Mental Status: She is alert and oriented to person, place, and time. Mental status is at baseline.  Psychiatric:        Mood and Affect: Mood normal.        Behavior: Behavior normal.        Thought Content: Thought content normal.    LABS: No results found for this or any previous visit (from the past 2160 hour(s)).  Assessment/Plan: 1. Encounter for health maintenance examination with abnormal findings Well appearing 64 year old female, will up date annual labs. At this time she refuses mammogram as well as colonoscopy. Discussed the importance of these tests in screening for breast and colon cancer, offered ColoGuard testing she also declined at this time. Will continue with discussions and assess her  readiness for testing. - CBC w/Diff/Platelet - Comprehensive Metabolic Panel (CMET) - Lipid Panel With LDL/HDL Ratio - TSH + free T4 - B12 - Vitamin D 1,25 dihydroxy  2. Essential hypertension, benign HR elevated, BP low end of normal, discontinue metoprolol at this time. Start Hyzaar, will follow-up in 4 weeks to assess response to therapy change. - losartan-hydrochlorothiazide (HYZAAR) 50-12.5 MG tablet; Take 1 tablet by mouth daily.  Dispense: 90 tablet; Refill: 3  3. Atherosclerosis of aorta (HCC) Due to findings as well as her long standing of cigarette smoking will start low dose statin therapy. Will also obtain bilateral carotid US to assess for disease. Will adjust therapy based on US imaging as well as labs. - US Carotid Duplex Bilateral; Future - rosuvastatin (CRESTOR) 10 MG tablet; Take 1 tablet (10 mg total) by mouth daily.  Dispense: 90 tablet; Refill: 3  4. Chronic bronchitis,  unspecified chronic bronchitis type (HCC) Will obtain updated PFT as she has not completed testing in quite some time. Will assess results and adjust plan of care accordingly. - Pulmonary function test; Future  5. Generalized anxiety disorder Symptoms are stable at this time, takes on as needed basis. Will go several weeks without requiring alprazolam. Will continue to monitor. - ALPRAZolam (XANAX) 0.25 MG tablet; Half to one tab po qd prn for anxiety  Dispense: 30 tablet; Refill: 0  6. Dysuria - UA/M w/rflx Culture, Routine  General Counseling: [place] verbalizes understanding of the findings of todays visit and agrees with plan of treatment. I have discussed any further diagnostic evaluation that may be needed or ordered today. We also reviewed her medications today. she has been encouraged to call the office with any questions or concerns that should arise related to todays visit.  Counseling: Hypertension Counseling:   The following hypertensive lifestyle modification were recommended and discussed:  1. Limiting alcohol intake to less than 1 oz/day of ethanol:(24 oz of beer or 8 oz of wine or 2 oz of 100-proof whiskey). 2. Take baby ASA 81 mg daily. 3. Importance of regular aerobic exercise and losing weight. 4. Reduce dietary saturated fat and cholesterol intake for overall cardiovascular health. 5. Maintaining adequate dietary potassium, calcium, and magnesium intake. 6. Regular monitoring of the blood pressure. 7. Reduce sodium intake to less than 100 mmol/day (less than 2.3 gm of sodium or less than 6 gm of sodium choride)    Orders Placed This Encounter  Procedures  . US Carotid Duplex Bilateral  . UA/M w/rflx Culture, Routine  . CBC w/Diff/Platelet  . Comprehensive Metabolic Panel (CMET)  . Lipid Panel With LDL/HDL Ratio  . TSH + free T4  . B12  . Vitamin D 1,25 dihydroxy  . Pulmonary function test    Meds ordered this encounter  Medications  .  losartan-hydrochlorothiazide (HYZAAR) 50-12.5 MG tablet    Sig: Take 1 tablet by mouth daily.    Dispense:  90 tablet    Refill:  3  . rosuvastatin (CRESTOR) 10 MG tablet    Sig: Take 1 tablet (10 mg total) by mouth daily.    Dispense:  90 tablet    Refill:  3  . ALPRAZolam (XANAX) 0.25 MG tablet    Sig: Half to one tab po qd prn for anxiety    Dispense:  30 tablet    Refill:  0    Total time spent: 35 Minutes  Time spent includes review of chart, medications, test results, and follow up plan with the patient.  This patient was seen by Brent General AGNP-C Collaboration with Dr Lyndon Code as a part of collaborative care agreement   Lubertha Basque. [facility] Internal Medicine

## 2020-06-27 ENCOUNTER — Other Ambulatory Visit: Payer: Self-pay | Admitting: Adult Health

## 2020-06-27 DIAGNOSIS — J449 Chronic obstructive pulmonary disease, unspecified: Secondary | ICD-10-CM

## 2020-06-27 LAB — UA/M W/RFLX CULTURE, ROUTINE
Bilirubin, UA: NEGATIVE
Glucose, UA: NEGATIVE
Ketones, UA: NEGATIVE
Leukocytes,UA: NEGATIVE
Nitrite, UA: NEGATIVE
Protein,UA: NEGATIVE
RBC, UA: NEGATIVE
Specific Gravity, UA: 1.009 (ref 1.005–1.030)
Urobilinogen, Ur: 0.2 mg/dL (ref 0.2–1.0)
pH, UA: 8 — ABNORMAL HIGH (ref 5.0–7.5)

## 2020-06-27 LAB — MICROSCOPIC EXAMINATION
Bacteria, UA: NONE SEEN
Casts: NONE SEEN /lpf
Epithelial Cells (non renal): NONE SEEN /hpf (ref 0–10)
RBC, Urine: NONE SEEN /hpf (ref 0–2)
WBC, UA: NONE SEEN /hpf (ref 0–5)

## 2020-06-27 MED ORDER — CLONAZEPAM 0.5 MG PO TABS: 0.5000 mg | ORAL_TABLET | Freq: Every day | ORAL | 0 refills | Status: DC | PRN

## 2020-06-28 ENCOUNTER — Other Ambulatory Visit: Payer: Self-pay | Admitting: Hospice and Palliative Medicine

## 2020-07-02 ENCOUNTER — Telehealth: Payer: Self-pay

## 2020-07-02 ENCOUNTER — Other Ambulatory Visit: Payer: Self-pay | Admitting: Adult Health

## 2020-07-02 DIAGNOSIS — F411 Generalized anxiety disorder: Secondary | ICD-10-CM

## 2020-07-02 NOTE — Telephone Encounter (Signed)
Confirmed pt pft 07/04/20

## 2020-07-04 ENCOUNTER — Ambulatory Visit (INDEPENDENT_AMBULATORY_CARE_PROVIDER_SITE_OTHER): Payer: Medicare Other | Admitting: Internal Medicine

## 2020-07-04 ENCOUNTER — Other Ambulatory Visit: Payer: Self-pay

## 2020-07-04 DIAGNOSIS — R0602 Shortness of breath: Secondary | ICD-10-CM | POA: Diagnosis not present

## 2020-07-04 LAB — PULMONARY FUNCTION TEST

## 2020-07-05 ENCOUNTER — Telehealth: Payer: Self-pay

## 2020-07-05 NOTE — Telephone Encounter (Signed)
Spoke to pt and asked if she wanted her Ipratropium sent to [facility] or thru [facility], and pt wants it sent to [facility].  She has prescription at [facility] but she hasn't picked it up yet from pharmacy as of 07/05/2020 per patient.  dbs

## 2020-07-12 NOTE — Procedures (Signed)
[facility] [address] [place], [address]  DATE OF SERVICE: July 04, 2020  Complete Pulmonary Function Testing Interpretation:  FINDINGS:  The forced vital capacity is moderately decreased.  FEV1 is severely decreased.  FEV1 FVC ratio is severely decreased.  Postbronchodilator there is significant improvement in the FEV1.  Total lung capacity is increased residual volume is increased residual volume total lung capacity ratio is increased.  FRC is increased.  DLCO is severely decreased.  IMPRESSION:  This pulmonary function study is consistent with severe obstructive lung disease.  There appears to be response to bronchodilators clinical correlation is recommended.  DLCO was also severely decreased.  Yevonne Pax, MD [facility] Pulmonary Critical Care Medicine Sleep Medicine

## 2020-07-17 ENCOUNTER — Ambulatory Visit (INDEPENDENT_AMBULATORY_CARE_PROVIDER_SITE_OTHER): Payer: Medicare Other | Admitting: Internal Medicine

## 2020-07-17 ENCOUNTER — Encounter: Payer: Self-pay | Admitting: Internal Medicine

## 2020-07-17 ENCOUNTER — Other Ambulatory Visit: Payer: Self-pay

## 2020-07-17 DIAGNOSIS — J449 Chronic obstructive pulmonary disease, unspecified: Secondary | ICD-10-CM | POA: Diagnosis not present

## 2020-07-17 DIAGNOSIS — F17218 Nicotine dependence, cigarettes, with other nicotine-induced disorders: Secondary | ICD-10-CM | POA: Diagnosis not present

## 2020-07-17 DIAGNOSIS — Z23 Encounter for immunization: Secondary | ICD-10-CM

## 2020-07-17 MED ORDER — BUDESONIDE-FORMOTEROL FUMARATE 160-4.5 MCG/ACT IN AERO: 1.0000 | INHALATION_SPRAY | Freq: Two times a day (BID) | RESPIRATORY_TRACT | 4 refills | Status: DC

## 2020-07-17 NOTE — Patient Instructions (Signed)
Chronic Obstructive Pulmonary Disease Chronic obstructive pulmonary disease (COPD) is a long-term (chronic) lung problem. When you have COPD, it is hard for air to get in and out of your lungs. Usually the condition gets worse over time, and your lungs will never return to normal. There are things you can do to keep yourself as healthy as possible.  Your doctor may treat your condition with: ? Medicines. ? Oxygen. ? Lung surgery.  Your doctor may also recommend: ? Rehabilitation. This includes steps to make your body work better. It may involve a team of specialists. ? Quitting smoking, if you smoke. ? Exercise and changes to your diet. ? Comfort measures (palliative care). Follow these instructions at home: Medicines  Take over-the-counter and prescription medicines only as told by your doctor.  Talk to your doctor before taking any cough or allergy medicines. You may need to avoid medicines that cause your lungs to be dry. Lifestyle  If you smoke, stop. Smoking makes the problem worse. If you need help quitting, ask your doctor.  Avoid being around things that make your breathing worse. This may include smoke, chemicals, and fumes.  Stay active, but remember to rest as well.  Learn and use tips on how to relax.  Make sure you get enough sleep. Most adults need at least 7 hours of sleep every night.  Eat healthy foods. Eat smaller meals more often. Rest before meals. Controlled breathing Learn and use tips on how to control your breathing as told by your doctor. Try:  Breathing in (inhaling) through your nose for 1 second. Then, pucker your lips and breath out (exhale) through your lips for 2 seconds.  Putting one hand on your belly (abdomen). Breathe in slowly through your nose for 1 second. Your hand on your belly should move out. Pucker your lips and breathe out slowly through your lips. Your hand on your belly should move in as you breathe out.  Controlled coughing Learn  and use controlled coughing to clear mucus from your lungs. Follow these steps: 1. Lean your head a little forward. 2. Breathe in deeply. 3. Try to hold your breath for 3 seconds. 4. Keep your mouth slightly open while coughing 2 times. 5. Spit any mucus out into a tissue. 6. Rest and do the steps again 1 or 2 times as needed. General instructions  Make sure you get all the shots (vaccines) that your doctor recommends. Ask your doctor about a flu shot and a pneumonia shot.  Use oxygen therapy and pulmonary rehabilitation if told by your doctor. If you need home oxygen therapy, ask your doctor if you should buy a tool to measure your oxygen level (oximeter).  Make a COPD action plan with your doctor. This helps you to know what to do if you feel worse than usual.  Manage any other conditions you have as told by your doctor.  Avoid going outside when it is very hot, cold, or humid.  Avoid people who have a sickness you can catch (contagious).  Keep all follow-up visits as told by your doctor. This is important. Contact a doctor if:  You cough up more mucus than usual.  There is a change in the color or thickness of the mucus.  It is harder to breathe than usual.  Your breathing is faster than usual.  You have trouble sleeping.  You need to use your medicines more often than usual.  You have trouble doing your normal activities such as getting dressed   or walking around the house. Get help right away if:  You have shortness of breath while resting.  You have shortness of breath that stops you from: ? Being able to talk. ? Doing normal activities.  Your chest hurts for longer than 5 minutes.  Your skin color is more blue than usual.  Your pulse oximeter shows that you have low oxygen for longer than 5 minutes.  You have a fever.  You feel too tired to breathe normally. Summary  Chronic obstructive pulmonary disease (COPD) is a long-term lung problem.  The way your  lungs work will never return to normal. Usually the condition gets worse over time. There are things you can do to keep yourself as healthy as possible.  Take over-the-counter and prescription medicines only as told by your doctor.  If you smoke, stop. Smoking makes the problem worse. This information is not intended to replace advice given to you by your health care provider. Make sure you discuss any questions you have with your health care provider. Document Revised: 09/04/2017 Document Reviewed: 10/27/2016 Elsevier Patient Education  2020 [employer]  
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
## 2020-07-26 ENCOUNTER — Other Ambulatory Visit: Payer: Self-pay | Admitting: Hospice and Palliative Medicine

## 2020-07-26 ENCOUNTER — Encounter: Payer: Self-pay | Admitting: Nurse Practitioner

## 2020-07-26 ENCOUNTER — Ambulatory Visit (INDEPENDENT_AMBULATORY_CARE_PROVIDER_SITE_OTHER): Payer: Medicare Other | Admitting: Nurse Practitioner

## 2020-07-26 ENCOUNTER — Other Ambulatory Visit: Payer: Self-pay

## 2020-07-26 VITALS — BP 131/79 | HR 70 | Temp 97.5°F | Resp 16 | Ht 65.0 in | Wt 182.6 lb

## 2020-07-26 DIAGNOSIS — F411 Generalized anxiety disorder: Secondary | ICD-10-CM

## 2020-07-26 DIAGNOSIS — J449 Chronic obstructive pulmonary disease, unspecified: Secondary | ICD-10-CM

## 2020-07-26 DIAGNOSIS — Z23 Encounter for immunization: Secondary | ICD-10-CM | POA: Diagnosis not present

## 2020-07-26 DIAGNOSIS — R300 Dysuria: Secondary | ICD-10-CM

## 2020-07-26 MED ORDER — ALPRAZOLAM 0.25 MG PO TABS: 0.2500 mg | ORAL_TABLET | Freq: Two times a day (BID) | ORAL | 1 refills | Status: DC | PRN

## 2020-07-26 MED ORDER — PROAIR HFA 108 (90 BASE) MCG/ACT IN AERS: 2.0000 | INHALATION_SPRAY | Freq: Four times a day (QID) | RESPIRATORY_TRACT | 3 refills | Status: DC | PRN

## 2020-07-26 MED ORDER — PNEUMOCOCCAL 13-VAL CONJ VACC IM SUSP: 0.5000 mL | Freq: Once | INTRAMUSCULAR | 0 refills | Status: AC

## 2020-07-26 NOTE — Telephone Encounter (Signed)
Pt has app, no 90 day supply on benzo

## 2020-07-26 NOTE — Progress Notes (Signed)
[facility] [address] [place], [place] [address]  Internal MEDICINE  Office Visit Note  Patient Name: Alicia Rivera  [id number]  [id number]  Date of Service: 08/18/2020  Chief Complaint  Patient presents with   Follow-up   Hypertension   Quality Metric Gaps    mammogram,tetnaus,hep C   controlled substance form    reviewed with PT    The patient is here for routine follow up. She states that she needs a new prescription for rescue inhaler. She does need to have name brand Proair, as ventolin does not work for her. She suffers from generalized anxiety. She takes duloxetine 30mg  every day. She was mistakenly sent in prescription for clonazepam at her last visit 06/27/2020. She is unable to take this as it makes her very sick to her stomach. She generally takes alprazolam 0.25mg , prescription is for twice daily as needed. Most recent prescription was filled 01/2020 and this was verified per her PDMP. She will discard previously prescribed clonazepam as she cannot take this medication.  Her blood pressure is well managed. She did have order placed for routine blood work at her most recent visit. She has not had this done yet. She declines a mammogram or colonoscopy. She would like to get the Prevnar 13 vaccine.       Current Medication: Outpatient Encounter Medications as of 07/26/2020  Medication Sig   albuterol (PROVENTIL) (2.5 MG/3ML) 0.083% nebulizer solution Take 3 mLs (2.5 mg total) by nebulization every 6 (six) hours as needed for wheezing or shortness of breath. DX J44.9   aspirin EC 81 MG tablet Take 81 mg by mouth daily.   budesonide-formoterol (SYMBICORT) 160-4.5 MCG/ACT inhaler Inhale 1 puff into the lungs 2 (two) times daily.   cetirizine (ZYRTEC) 10 MG tablet TAKE 1 TABLET(10 MG) BY MOUTH DAILY   DALIRESP 500 MCG TABS tablet TAKE 1 TABLET(500 MCG) BY MOUTH DAILY   DULoxetine (CYMBALTA) 30 MG capsule Take 1 capsule (30 mg total) by mouth daily.    guaiFENesin-dextromethorphan (ROBITUSSIN DM) 100-10 MG/5ML syrup Take 5 mLs by mouth every 4 (four) hours as needed for cough.   ipratropium (ATROVENT) 0.02 % nebulizer solution USE 1 VIAL VIA NEBULIZER EVERY 6 HOURS AS NEEDED FOR WHEEZING OR SHORTNESS OF BREATH   losartan-hydrochlorothiazide (HYZAAR) 50-12.5 MG tablet Take 1 tablet by mouth daily.   montelukast (SINGULAIR) 10 MG tablet TAKE 1 TABLET(10 MG) BY MOUTH AT BEDTIME   QUEtiapine (SEROQUEL) 25 MG tablet TAKE 1 TABLET(25 MG) BY MOUTH DAILY AS NEEDED   tiotropium (SPIRIVA) 18 MCG inhalation capsule Place 18 mcg into inhaler and inhale daily.   [DISCONTINUED] albuterol (VENTOLIN HFA) 108 (90 Base) MCG/ACT inhaler INHALE 1 TO 2 PUFFS INTO THE LUNGS EVERY 4 HOURS AS NEEDED FOR WHEEZING OR SHORTNESS OF BREATH   [DISCONTINUED] clonazePAM (KLONOPIN) 0.5 MG tablet Take 1 tablet (0.5 mg total) by mouth daily as needed for anxiety.   ALPRAZolam (XANAX) 0.25 MG tablet Take 1 tablet (0.25 mg total) by mouth 2 (two) times daily as needed for anxiety.   [EXPIRED] pneumococcal 13-valent conjugate vaccine (PREVNAR 13) SUSP injection Inject 0.5 mLs into the muscle once for 1 dose.   PROAIR HFA 108 (90 Base) MCG/ACT inhaler Inhale 2 puffs into the lungs every 6 (six) hours as needed for wheezing or shortness of breath.   No facility-administered encounter medications on file as of 07/26/2020.    Surgical History: Past Surgical History:  Procedure Laterality Date   CATARACT EXTRACTION W/PHACO  Left 12/13/2019   Procedure: CATARACT EXTRACTION PHACO AND INTRAOCULAR LENS PLACEMENT (IOC) LEFT;  Surgeon: Galen Manila, MD;  Location: [facility];  Service: Ophthalmology;  Laterality: Left;  CDE 4.90 U/S 0:36.8   CATARACT EXTRACTION W/PHACO Right 01/03/2020   Procedure: CATARACT EXTRACTION PHACO AND INTRAOCULAR LENS PLACEMENT (IOC) RIGHT 6.01 00:44.5;  Surgeon: Galen Manila, MD;  Location: [facility];  Service: Ophthalmology;   Laterality: Right;    Medical History: Past Medical History:  Diagnosis Date   Anxiety    COPD (chronic obstructive pulmonary disease) (HCC)    HTN (hypertension)    Pneumonia 09/18/2017   Tobacco abuse     Family History: Family History  Problem Relation Age of Onset   Emphysema Brother    Lung disease Mother    Heart disease Father     Social History   Socioeconomic History   Marital status: Single    Spouse name: Not on file   Number of children: Not on file   Years of education: Not on file   Highest education level: Not on file  Occupational History   Occupation: disabled  Tobacco Use   Smoking status: Current Some Day Smoker    Years: 50.00    Types: Cigarettes   Smokeless tobacco: Never Used   Tobacco comment: 5 cigs/week. down to 2 a week 07/17/20  Vaping Use   Vaping Use: Never used  Substance and Sexual Activity   Alcohol use: No   Drug use: No   Sexual activity: Never  Other Topics Concern   Not on file  Social History Narrative   Not on file   Social Determinants of Health   Financial Resource Strain:    Difficulty of Paying Living Expenses: Not on file  Food Insecurity:    Worried About Running Out of Food in the Last Year: Not on file   [employer] of Food in the Last Year: Not on file  Transportation Needs:    Lack of Transportation (Medical): Not on file   Lack of Transportation (Non-Medical): Not on file  Physical Activity:    Days of Exercise per Week: Not on file   Minutes of Exercise per Session: Not on file  Stress:    Feeling of Stress : Not on file  Social Connections:    Frequency of Communication with Friends and Family: Not on file   Frequency of Social Gatherings with Friends and Family: Not on file   Attends Religious Services: Not on file   Active Member of Clubs or Organizations: Not on file   Attends Banker Meetings: Not on file   Marital Status: Not on file  Intimate  Partner Violence:    Fear of Current or Ex-Partner: Not on file   Emotionally Abused: Not on file   Physically Abused: Not on file   Sexually Abused: Not on file      Review of Systems  Constitutional: Negative for activity change, chills, fatigue and unexpected weight change.  HENT: Negative for congestion, postnasal drip, rhinorrhea, sneezing and sore throat.   Respiratory: Positive for shortness of breath and wheezing. Negative for cough and chest tightness.        Mostly with exertion. Uses symbicort twice daily and uses proair rescue inhaler or albuterol nebulizer treatments as needed.  Cardiovascular: Negative for chest pain and palpitations.  Gastrointestinal: Negative for abdominal pain, constipation, diarrhea, nausea and vomiting.  Endocrine: Negative for cold intolerance, heat intolerance, polydipsia and polyuria.  Musculoskeletal: Negative  for arthralgias, back pain, joint swelling and neck pain.  Skin: Negative for rash.  Allergic/Immunologic: Positive for environmental allergies.  Neurological: Negative for tremors, numbness and headaches.  Hematological: Negative for adenopathy. Does not bruise/bleed easily.  Psychiatric/Behavioral: Negative for behavioral problems (Depression), sleep disturbance and suicidal ideas. The patient is nervous/anxious.     Today's Vitals   07/26/20 1348  BP: 131/79  Pulse: 70  Resp: 16  Temp: (!) 97.5 F (36.4 C)  SpO2: 97%  Weight: 182 lb 9.6 oz (82.8 kg)  Height: 5\' 5"  (1.651 m)   Body mass index is 30.39 kg/m.  Physical Exam Vitals and nursing note reviewed.  Constitutional:      General: She is not in acute distress.    Appearance: Normal appearance. She is well-developed. She is not diaphoretic.  HENT:     Head: Normocephalic and atraumatic.     Mouth/Throat:     Pharynx: No oropharyngeal exudate.  Eyes:     Pupils: Pupils are equal, round, and reactive to light.  Neck:     Thyroid: No thyromegaly.     Vascular:  No carotid bruit or JVD.     Trachea: No tracheal deviation.  Cardiovascular:     Rate and Rhythm: Normal rate and regular rhythm.     Heart sounds: Normal heart sounds. No murmur heard.  No friction rub. No gallop.   Pulmonary:     Effort: Pulmonary effort is normal. No respiratory distress.     Breath sounds: Normal breath sounds. No wheezing or rales.  Chest:     Chest wall: No tenderness.  Abdominal:     Palpations: Abdomen is soft.  Musculoskeletal:        General: Normal range of motion.     Cervical back: Normal range of motion and neck supple.  Lymphadenopathy:     Cervical: No cervical adenopathy.  Skin:    General: Skin is warm and dry.  Neurological:     Mental Status: She is alert and oriented to person, place, and time.     Cranial Nerves: No cranial nerve deficit.  Psychiatric:        Mood and Affect: Mood normal.        Behavior: Behavior normal.        Thought Content: Thought content normal.        Judgment: Judgment normal.    Assessment/Plan: 1. Obstructive chronic bronchitis without exacerbation (HCC) Stable. Continue using symbicort twice daily. Use rescue inhaler as needed and as prescribed  - PROAIR HFA 108 (90 Base) MCG/ACT inhaler; Inhale 2 puffs into the lungs every 6 (six) hours as needed for wheezing or shortness of breath.  Dispense: 18 g; Refill: 3  2. Generalized anxiety disorder Continue duloxetine daily. May take alprazolam 0.25mg  twice daily as needed for acute anxiety.  - ALPRAZolam (XANAX) 0.25 MG tablet; Take 1 tablet (0.25 mg total) by mouth 2 (two) times daily as needed for anxiety.  Dispense: 45 tablet; Refill: 1  3. Need for vaccination against Streptococcus pneumoniae using pneumococcal conjugate vaccine 7 Prescription for prevnar 13 sent to her pharmacy for administration today. - pneumococcal 13-valent conjugate vaccine (PREVNAR 13) SUSP injection; Inject 0.5 mLs into the muscle once for 1 dose.  Dispense: 0.5 mL; Refill:  0  General Counseling: Lucelia verbalizes understanding of the findings of todays visit and agrees with plan of treatment. I have discussed any further diagnostic evaluation that may be needed or ordered today. We also reviewed her medications  today. she has been encouraged to call the office with any questions or concerns that should arise related to todays visit.   This patient was seen by Vincent Gros FNP Collaboration with Dr Lyndon Code as a part of collaborative care agreement  Meds ordered this encounter  Medications   PROAIR HFA 108 (90 Base) MCG/ACT inhaler    Sig: Inhale 2 puffs into the lungs every 6 (six) hours as needed for wheezing or shortness of breath.    Dispense:  18 g    Refill:  3    Patient requests proair rescue inhaler only.    Order Specific Question:   Supervising Provider    Answer:   Lyndon Code [[id number]]   pneumococcal 13-valent conjugate vaccine (PREVNAR 13) SUSP injection    Sig: Inject 0.5 mLs into the muscle once for 1 dose.    Dispense:  0.5 mL    Refill:  0    Order Specific Question:   Supervising Provider    Answer:   Lyndon Code [[id number]]   ALPRAZolam (XANAX) 0.25 MG tablet    Sig: Take 1 tablet (0.25 mg total) by mouth 2 (two) times daily as needed for anxiety.    Dispense:  45 tablet    Refill:  1    Order Specific Question:   Supervising Provider    Answer:   Lyndon Code [[id number]]    Total time spent: 25 Minutes   Time spent includes review of chart, medications, test results, and follow up plan with the patient.      Dr Lyndon Code Internal medicine

## 2020-08-18 DIAGNOSIS — Z23 Encounter for immunization: Secondary | ICD-10-CM | POA: Insufficient documentation

## 2020-08-20 ENCOUNTER — Encounter: Payer: Self-pay | Admitting: Internal Medicine

## 2020-08-20 ENCOUNTER — Other Ambulatory Visit: Payer: Self-pay

## 2020-08-20 ENCOUNTER — Ambulatory Visit (INDEPENDENT_AMBULATORY_CARE_PROVIDER_SITE_OTHER): Payer: Medicare Other | Admitting: Internal Medicine

## 2020-08-20 VITALS — BP 140/82 | HR 71 | Temp 98.5°F | Resp 16 | Ht 65.0 in | Wt 185.2 lb

## 2020-08-20 DIAGNOSIS — J440 Chronic obstructive pulmonary disease with acute lower respiratory infection: Secondary | ICD-10-CM | POA: Diagnosis not present

## 2020-08-20 DIAGNOSIS — F17218 Nicotine dependence, cigarettes, with other nicotine-induced disorders: Secondary | ICD-10-CM | POA: Diagnosis not present

## 2020-08-20 DIAGNOSIS — R0602 Shortness of breath: Secondary | ICD-10-CM | POA: Diagnosis not present

## 2020-08-20 DIAGNOSIS — J9622 Acute and chronic respiratory failure with hypercapnia: Secondary | ICD-10-CM

## 2020-08-20 DIAGNOSIS — R059 Cough, unspecified: Secondary | ICD-10-CM

## 2020-08-20 MED ORDER — PREDNISONE 10 MG (21) PO TBPK: ORAL_TABLET | ORAL | 0 refills | Status: DC

## 2020-08-20 MED ORDER — LEVOFLOXACIN 500 MG PO TABS: 500.0000 mg | ORAL_TABLET | Freq: Every day | ORAL | 0 refills | Status: DC

## 2020-08-20 NOTE — Progress Notes (Signed)
[facility] [address] [place], [place] [address]  Pulmonary Sleep Medicine   Office Visit Note  Patient Name: Alicia Rivera A Zywicki DOB: 07/17/1956 MRN [id number]  Date of Service: 08/20/2020  Complaints/HPI: Cough sputum yellow green. Smoking also.  The patient presents today because she has been having a cough and some congestion.  She states that she is bringing up some sputum.  She states she was not vaccinated for COVID-19.  She denies having any fevers no chills.  She states the sputum is yellow in color.  She has not had any hemoptysis.  Denies having any chest pain no palpitations at this time.  No nausea no vomiting and no diarrhea.  She continues to smoke rather heavily.  She has not been able to successfully quit smoking  ROS  General: (-) fever, (-) chills, (-) night sweats, (-) weakness Skin: (-) rashes, (-) itching,. Eyes: (-) visual changes, (-) redness, (-) itching. Nose and Sinuses: (-) nasal stuffiness or itchiness, (-) postnasal drip, (-) nosebleeds, (-) sinus trouble. Mouth and Throat: (-) sore throat, (-) hoarseness. Neck: (-) swollen glands, (-) enlarged thyroid, (-) neck pain. Respiratory: + cough, (-) bloody sputum, + shortness of breath, + wheezing. Cardiovascular: - ankle swelling, (-) chest pain. Lymphatic: (-) lymph node enlargement. Neurologic: (-) numbness, (-) tingling. Psychiatric: (-) anxiety, (-) depression   Current Medication: Outpatient Encounter Medications as of 08/20/2020  Medication Sig  . albuterol (PROVENTIL) (2.5 MG/3ML) 0.083% nebulizer solution Take 3 mLs (2.5 mg total) by nebulization every 6 (six) hours as needed for wheezing or shortness of breath. DX J44.9  . ALPRAZolam (XANAX) 0.25 MG tablet Take 1 tablet (0.25 mg total) by mouth 2 (two) times daily as needed for anxiety.  Marland Kitchen. aspirin EC 81 MG tablet Take 81 mg by mouth daily.  . budesonide-formoterol (SYMBICORT) 160-4.5 MCG/ACT inhaler Inhale 1 puff into the lungs 2 (two)  times daily.  . cetirizine (ZYRTEC) 10 MG tablet TAKE 1 TABLET(10 MG) BY MOUTH DAILY  . DALIRESP 500 MCG TABS tablet TAKE 1 TABLET(500 MCG) BY MOUTH DAILY  . DULoxetine (CYMBALTA) 30 MG capsule Take 1 capsule (30 mg total) by mouth daily.  Marland Kitchen. guaiFENesin-dextromethorphan (ROBITUSSIN DM) 100-10 MG/5ML syrup Take 5 mLs by mouth every 4 (four) hours as needed for cough.  Marland Kitchen. ipratropium (ATROVENT) 0.02 % nebulizer solution USE 1 VIAL VIA NEBULIZER EVERY 6 HOURS AS NEEDED FOR WHEEZING OR SHORTNESS OF BREATH  . losartan-hydrochlorothiazide (HYZAAR) 50-12.5 MG tablet Take 1 tablet by mouth daily.  . montelukast (SINGULAIR) 10 MG tablet TAKE 1 TABLET(10 MG) BY MOUTH AT BEDTIME  . PROAIR HFA 108 (90 Base) MCG/ACT inhaler Inhale 2 puffs into the lungs every 6 (six) hours as needed for wheezing or shortness of breath.  . QUEtiapine (SEROQUEL) 25 MG tablet TAKE 1 TABLET(25 MG) BY MOUTH DAILY AS NEEDED  . tiotropium (SPIRIVA) 18 MCG inhalation capsule Place 18 mcg into inhaler and inhale daily.  Marland Kitchen. levofloxacin (LEVAQUIN) 500 MG tablet Take 1 tablet (500 mg total) by mouth daily.  . predniSONE (STERAPRED UNI-PAK 21 TAB) 10 MG (21) TBPK tablet As directed   No facility-administered encounter medications on file as of 08/20/2020.    Surgical History: Past Surgical History:  Procedure Laterality Date  . CATARACT EXTRACTION W/PHACO Left 12/13/2019   Procedure: CATARACT EXTRACTION PHACO AND INTRAOCULAR LENS PLACEMENT (IOC) LEFT;  Surgeon: Galen ManilaPorfilio, William, MD;  [facility];  Service: Ophthalmology;  Laterality: Left;  CDE 4.90 U/S 0:36.8  . CATARACT  EXTRACTION W/PHACO Right 01/03/2020   Procedure: CATARACT EXTRACTION PHACO AND INTRAOCULAR LENS PLACEMENT (IOC) RIGHT 6.01 00:44.5;  Surgeon: Galen Manila, MD;  Location: [facility];  Service: Ophthalmology;  Laterality: Right;    Medical History: Past Medical History:  Diagnosis Date  . Anxiety   . COPD (chronic obstructive  pulmonary disease) (HCC)   . HTN (hypertension)   . Pneumonia 09/18/2017  . Tobacco abuse     Family History: Family History  Problem Relation Age of Onset  . Emphysema Brother   . Lung disease Mother   . Heart disease Father     Social History: Social History   Socioeconomic History  . Marital status: Single    Spouse name: Not on file  . Number of children: Not on file  . Years of education: Not on file  . Highest education level: Not on file  Occupational History  . Occupation: disabled  Tobacco Use  . Smoking status: Current Some Day Smoker    Years: 50.00    Types: Cigarettes  . Smokeless tobacco: Never Used  . Tobacco comment: 5 cigs/week. down to 2 a week 07/17/20  Vaping Use  . Vaping Use: Never used  Substance and Sexual Activity  . Alcohol use: No  . Drug use: No  . Sexual activity: Never  Other Topics Concern  . Not on file  Social History Narrative  . Not on file   Social Determinants of Health   Financial Resource Strain:   . Difficulty of Paying Living Expenses: Not on file  Food Insecurity:   . Worried About Programme researcher, broadcasting/film/video in the Last Year: Not on file  . Ran Out of Food in the Last Year: Not on file  Transportation Needs:   . Lack of Transportation (Medical): Not on file  . Lack of Transportation (Non-Medical): Not on file  Physical Activity:   . Days of Exercise per Week: Not on file  . Minutes of Exercise per Session: Not on file  Stress:   . Feeling of Stress : Not on file  Social Connections:   . Frequency of Communication with Friends and Family: Not on file  . Frequency of Social Gatherings with Friends and Family: Not on file  . Attends Religious Services: Not on file  . Active Member of Clubs or Organizations: Not on file  . Attends Banker Meetings: Not on file  . Marital Status: Not on file  Intimate Partner Violence:   . Fear of Current or Ex-Partner: Not on file  . Emotionally Abused: Not on file  .  Physically Abused: Not on file  . Sexually Abused: Not on file    Vital Signs: Blood pressure 140/82, pulse 71, temperature 98.5 F (36.9 C), resp. rate 16, height 5\' 5"  (1.651 m), weight 185 lb 3.2 oz (84 kg), SpO2 93 %.  Examination: General Appearance: The patient is well-developed, well-nourished, and in no distress. Skin: Gross inspection of skin unremarkable. Head: normocephalic, no gross deformities. Eyes: no gross deformities noted. ENT: ears appear grossly normal no exudates. Neck: Supple. No thyromegaly. No LAD. Respiratory: few rhonchi noted at this time. Cardiovascular: Normal S1 and S2 without murmur or rub. Extremities: No cyanosis. pulses are equal. Neurologic: Alert and oriented. No involuntary movements.  LABS: Recent Results (from the past 2160 hour(s))  UA/M w/rflx Culture, Routine     Status: Abnormal   Collection Time: 06/26/20 11:09 AM   Specimen: Urine   Urine  Result Value Ref  Range   Specific Gravity, UA 1.009 1.005 - 1.030   pH, UA 8.0 (H) 5.0 - 7.5   Color, UA Yellow Yellow   Appearance Ur Clear Clear   Leukocytes,UA Negative Negative   Protein,UA Negative Negative/Trace   Glucose, UA Negative Negative   Ketones, UA Negative Negative   RBC, UA Negative Negative   Bilirubin, UA Negative Negative   Urobilinogen, Ur 0.2 0.2 - 1.0 mg/dL   Nitrite, UA Negative Negative   Microscopic Examination Comment     Comment: Microscopic follows if indicated.   Microscopic Examination See below:     Comment: Microscopic was indicated and was performed.   Urinalysis Reflex Comment     Comment: This specimen will not reflex to a Urine Culture.  Microscopic Examination     Status: None   Collection Time: 06/26/20 11:09 AM   Urine  Result Value Ref Range   WBC, UA None seen 0 - 5 /hpf   RBC None seen 0 - 2 /hpf   Epithelial Cells (non renal) None seen 0 - 10 /hpf   Casts None seen None seen /lpf   Bacteria, UA None seen None seen/Few  Pulmonary function  test     Status: None   Collection Time: 07/04/20  9:00 AM  Result Value Ref Range   FEV1     FVC     FEV1/FVC     TLC     DLCO      Radiology: No results found.  No results found.  No results found.    Assessment and Plan: Patient Active Problem List   Diagnosis Date Noted  . Need for vaccination against Streptococcus pneumoniae using pneumococcal conjugate vaccine 7 08/18/2020  . Atherosclerosis of aorta (HCC) 06/26/2020  . Cough 08/12/2019  . Bronchitis 06/03/2019  . Obstructive chronic bronchitis without exacerbation (HCC) 06/03/2019  . Generalized anxiety disorder 06/03/2019  . Pneumonia 06/04/2018  . Elevated troponin level 01/30/2018  . Encephalopathy, metabolic 01/25/2018  . Acute on chronic respiratory failure with hypercapnia (HCC) 01/08/2018  . Anxiety 01/03/2018  . Essential hypertension 01/03/2018  . Smoker   . Acute encephalopathy   . Acute respiratory failure with hypoxemia (HCC) 04/16/2017  . COPD exacerbation (HCC)   . Palliative care by specialist   . Goals of care, counseling/discussion   . Respiratory insufficiency 01/14/2014  . Hypoxemia 01/14/2014    1. COPD exacerbation she has an exacerbation of COPD at this time.  She was not vaccinated so this is of concern however she adamantly denies having any exposure to anyone with COVID-19.  I have given her a prescription for steroids as well as for Levaquin which she will take. 2. Cough she has a chronic cough likely related to cigarette smoking.  Unfortunately despite efforts to get her to quit smoking she has not been successful.  Once again encouraged to quit 3. Smoker as above smoking cessation counseling was provided 4. Chronic respiratory failure currently she is not on oxygen.  Her saturations are acceptable.  Pulmonary function should be reassessed and followed.  General Counseling: I have discussed the findings of the evaluation and examination with Charvi.  I have also discussed any further  diagnostic evaluation thatmay be needed or ordered today. Kareen verbalizes understanding of the findings of todays visit. We also reviewed her medications today and discussed drug interactions and side effects including but not limited excessive drowsiness and altered mental states. We also discussed that there is always a risk not just to  her but also people around her. she has been encouraged to call the office with any questions or concerns that should arise related to todays visit.  Orders Placed This Encounter  Procedures  . DG Chest 2 View    Standing Status:   Future    Standing Expiration Date:   08/20/2021    Order Specific Question:   Reason for Exam (SYMPTOM  OR DIAGNOSIS REQUIRED)    Answer:   cough    Order Specific Question:   Preferred imaging location?    Answer:   [facility]     Time spent:  I have personally obtained a history, examined the patient, evaluated laboratory and imaging results, formulated the assessment and plan and placed orders.    Yevonne Pax, MD [facility] Pulmonary and Critical Care Sleep medicine

## 2020-08-20 NOTE — Patient Instructions (Signed)
Chronic Obstructive Pulmonary Disease Chronic obstructive pulmonary disease (COPD) is a long-term (chronic) lung problem. When you have COPD, it is hard for air to get in and out of your lungs. Usually the condition gets worse over time, and your lungs will never return to normal. There are things you can do to keep yourself as healthy as possible.  Your doctor may treat your condition with: ? Medicines. ? Oxygen. ? Lung surgery.  Your doctor may also recommend: ? Rehabilitation. This includes steps to make your body work better. It may involve a team of specialists. ? Quitting smoking, if you smoke. ? Exercise and changes to your diet. ? Comfort measures (palliative care). Follow these instructions at home: Medicines  Take over-the-counter and prescription medicines only as told by your doctor.  Talk to your doctor before taking any cough or allergy medicines. You may need to avoid medicines that cause your lungs to be dry. Lifestyle  If you smoke, stop. Smoking makes the problem worse. If you need help quitting, ask your doctor.  Avoid being around things that make your breathing worse. This may include smoke, chemicals, and fumes.  Stay active, but remember to rest as well.  Learn and use tips on how to relax.  Make sure you get enough sleep. Most adults need at least 7 hours of sleep every night.  Eat healthy foods. Eat smaller meals more often. Rest before meals. Controlled breathing Learn and use tips on how to control your breathing as told by your doctor. Try:  Breathing in (inhaling) through your nose for 1 second. Then, pucker your lips and breath out (exhale) through your lips for 2 seconds.  Putting one hand on your belly (abdomen). Breathe in slowly through your nose for 1 second. Your hand on your belly should move out. Pucker your lips and breathe out slowly through your lips. Your hand on your belly should move in as you breathe out.  Controlled coughing Learn  and use controlled coughing to clear mucus from your lungs. Follow these steps: 1. Lean your head a little forward. 2. Breathe in deeply. 3. Try to hold your breath for 3 seconds. 4. Keep your mouth slightly open while coughing 2 times. 5. Spit any mucus out into a tissue. 6. Rest and do the steps again 1 or 2 times as needed. General instructions  Make sure you get all the shots (vaccines) that your doctor recommends. Ask your doctor about a flu shot and a pneumonia shot.  Use oxygen therapy and pulmonary rehabilitation if told by your doctor. If you need home oxygen therapy, ask your doctor if you should buy a tool to measure your oxygen level (oximeter).  Make a COPD action plan with your doctor. This helps you to know what to do if you feel worse than usual.  Manage any other conditions you have as told by your doctor.  Avoid going outside when it is very hot, cold, or humid.  Avoid people who have a sickness you can catch (contagious).  Keep all follow-up visits as told by your doctor. This is important. Contact a doctor if:  You cough up more mucus than usual.  There is a change in the color or thickness of the mucus.  It is harder to breathe than usual.  Your breathing is faster than usual.  You have trouble sleeping.  You need to use your medicines more often than usual.  You have trouble doing your normal activities such as getting dressed   or walking around the house. Get help right away if:  You have shortness of breath while resting.  You have shortness of breath that stops you from: ? Being able to talk. ? Doing normal activities.  Your chest hurts for longer than 5 minutes.  Your skin color is more blue than usual.  Your pulse oximeter shows that you have low oxygen for longer than 5 minutes.  You have a fever.  You feel too tired to breathe normally. Summary  Chronic obstructive pulmonary disease (COPD) is a long-term lung problem.  The way your  lungs work will never return to normal. Usually the condition gets worse over time. There are things you can do to keep yourself as healthy as possible.  Take over-the-counter and prescription medicines only as told by your doctor.  If you smoke, stop. Smoking makes the problem worse. This information is not intended to replace advice given to you by your health care provider. Make sure you discuss any questions you have with your health care provider. Document Revised: 09/04/2017 Document Reviewed: 10/27/2016 Elsevier Patient Education  2020 [employer]  

## 2020-09-03 DIAGNOSIS — Z961 Presence of intraocular lens: Secondary | ICD-10-CM | POA: Diagnosis not present

## 2020-10-01 ENCOUNTER — Other Ambulatory Visit: Payer: Self-pay | Admitting: Adult Health

## 2020-10-01 DIAGNOSIS — Z889 Allergy status to unspecified drugs, medicaments and biological substances status: Secondary | ICD-10-CM

## 2020-10-06 DIAGNOSIS — I639 Cerebral infarction, unspecified: Secondary | ICD-10-CM

## 2020-10-06 HISTORY — DX: Cerebral infarction, unspecified: I63.9

## 2020-10-14 ENCOUNTER — Other Ambulatory Visit: Payer: Self-pay | Admitting: Adult Health

## 2020-10-14 DIAGNOSIS — Z889 Allergy status to unspecified drugs, medicaments and biological substances status: Secondary | ICD-10-CM

## 2020-10-26 ENCOUNTER — Ambulatory Visit: Payer: Medicare Other | Admitting: Nurse Practitioner

## 2020-10-26 DIAGNOSIS — Z9282 Status post administration of tPA (rtPA) in a different facility within the last 24 hours prior to admission to current facility: Secondary | ICD-10-CM | POA: Diagnosis not present

## 2020-10-26 DIAGNOSIS — I10 Essential (primary) hypertension: Secondary | ICD-10-CM | POA: Diagnosis present

## 2020-10-26 DIAGNOSIS — I161 Hypertensive emergency: Secondary | ICD-10-CM | POA: Diagnosis not present

## 2020-10-26 DIAGNOSIS — Z79899 Other long term (current) drug therapy: Secondary | ICD-10-CM | POA: Diagnosis not present

## 2020-10-26 DIAGNOSIS — I639 Cerebral infarction, unspecified: Secondary | ICD-10-CM | POA: Diagnosis not present

## 2020-10-26 DIAGNOSIS — G8191 Hemiplegia, unspecified affecting right dominant side: Secondary | ICD-10-CM | POA: Diagnosis present

## 2020-10-26 DIAGNOSIS — I6782 Cerebral ischemia: Secondary | ICD-10-CM | POA: Diagnosis not present

## 2020-10-26 DIAGNOSIS — Z720 Tobacco use: Secondary | ICD-10-CM | POA: Diagnosis not present

## 2020-10-26 DIAGNOSIS — Z20822 Contact with and (suspected) exposure to covid-19: Secondary | ICD-10-CM | POA: Diagnosis not present

## 2020-10-26 DIAGNOSIS — Z7951 Long term (current) use of inhaled steroids: Secondary | ICD-10-CM | POA: Diagnosis not present

## 2020-10-26 DIAGNOSIS — Z66 Do not resuscitate: Secondary | ICD-10-CM | POA: Diagnosis not present

## 2020-10-26 DIAGNOSIS — R29705 NIHSS score 5: Secondary | ICD-10-CM | POA: Diagnosis not present

## 2020-10-26 DIAGNOSIS — R001 Bradycardia, unspecified: Secondary | ICD-10-CM | POA: Diagnosis not present

## 2020-10-26 DIAGNOSIS — R200 Anesthesia of skin: Secondary | ICD-10-CM | POA: Diagnosis not present

## 2020-10-26 DIAGNOSIS — E785 Hyperlipidemia, unspecified: Secondary | ICD-10-CM | POA: Diagnosis not present

## 2020-10-26 DIAGNOSIS — R29898 Other symptoms and signs involving the musculoskeletal system: Secondary | ICD-10-CM | POA: Diagnosis not present

## 2020-10-26 DIAGNOSIS — I6359 Cerebral infarction due to unspecified occlusion or stenosis of other cerebral artery: Secondary | ICD-10-CM | POA: Diagnosis not present

## 2020-10-26 DIAGNOSIS — F419 Anxiety disorder, unspecified: Secondary | ICD-10-CM | POA: Diagnosis present

## 2020-10-26 DIAGNOSIS — I6381 Other cerebral infarction due to occlusion or stenosis of small artery: Secondary | ICD-10-CM | POA: Diagnosis present

## 2020-10-26 DIAGNOSIS — J449 Chronic obstructive pulmonary disease, unspecified: Secondary | ICD-10-CM | POA: Diagnosis present

## 2020-10-26 DIAGNOSIS — Z7952 Long term (current) use of systemic steroids: Secondary | ICD-10-CM | POA: Diagnosis not present

## 2020-10-26 DIAGNOSIS — F32A Depression, unspecified: Secondary | ICD-10-CM | POA: Diagnosis not present

## 2020-10-26 DIAGNOSIS — R0602 Shortness of breath: Secondary | ICD-10-CM | POA: Diagnosis not present

## 2020-10-26 DIAGNOSIS — Z8673 Personal history of transient ischemic attack (TIA), and cerebral infarction without residual deficits: Secondary | ICD-10-CM | POA: Diagnosis not present

## 2020-10-26 DIAGNOSIS — R0689 Other abnormalities of breathing: Secondary | ICD-10-CM | POA: Diagnosis not present

## 2020-10-26 DIAGNOSIS — F410 Panic disorder [episodic paroxysmal anxiety] without agoraphobia: Secondary | ICD-10-CM | POA: Diagnosis not present

## 2020-10-26 DIAGNOSIS — R29704 NIHSS score 4: Secondary | ICD-10-CM | POA: Diagnosis present

## 2020-10-26 DIAGNOSIS — J329 Chronic sinusitis, unspecified: Secondary | ICD-10-CM | POA: Diagnosis not present

## 2020-10-26 DIAGNOSIS — F17210 Nicotine dependence, cigarettes, uncomplicated: Secondary | ICD-10-CM | POA: Diagnosis present

## 2020-10-26 DIAGNOSIS — J3489 Other specified disorders of nose and nasal sinuses: Secondary | ICD-10-CM | POA: Diagnosis not present

## 2020-10-26 DIAGNOSIS — Z882 Allergy status to sulfonamides status: Secondary | ICD-10-CM | POA: Diagnosis not present

## 2020-10-26 DIAGNOSIS — Z7982 Long term (current) use of aspirin: Secondary | ICD-10-CM | POA: Diagnosis not present

## 2020-10-27 DIAGNOSIS — R0602 Shortness of breath: Secondary | ICD-10-CM | POA: Diagnosis not present

## 2020-10-27 DIAGNOSIS — I6782 Cerebral ischemia: Secondary | ICD-10-CM | POA: Diagnosis not present

## 2020-10-27 DIAGNOSIS — E785 Hyperlipidemia, unspecified: Secondary | ICD-10-CM | POA: Diagnosis not present

## 2020-10-27 DIAGNOSIS — J449 Chronic obstructive pulmonary disease, unspecified: Secondary | ICD-10-CM | POA: Diagnosis not present

## 2020-10-27 DIAGNOSIS — F419 Anxiety disorder, unspecified: Secondary | ICD-10-CM | POA: Diagnosis not present

## 2020-10-27 DIAGNOSIS — I6381 Other cerebral infarction due to occlusion or stenosis of small artery: Secondary | ICD-10-CM | POA: Diagnosis not present

## 2020-10-27 DIAGNOSIS — I639 Cerebral infarction, unspecified: Secondary | ICD-10-CM | POA: Diagnosis not present

## 2020-10-27 DIAGNOSIS — I10 Essential (primary) hypertension: Secondary | ICD-10-CM | POA: Diagnosis not present

## 2020-10-28 DIAGNOSIS — I10 Essential (primary) hypertension: Secondary | ICD-10-CM | POA: Diagnosis not present

## 2020-10-28 DIAGNOSIS — F419 Anxiety disorder, unspecified: Secondary | ICD-10-CM | POA: Diagnosis not present

## 2020-10-28 DIAGNOSIS — J449 Chronic obstructive pulmonary disease, unspecified: Secondary | ICD-10-CM | POA: Diagnosis not present

## 2020-10-28 DIAGNOSIS — I639 Cerebral infarction, unspecified: Secondary | ICD-10-CM | POA: Diagnosis not present

## 2020-10-28 DIAGNOSIS — E785 Hyperlipidemia, unspecified: Secondary | ICD-10-CM | POA: Diagnosis not present

## 2020-10-29 DIAGNOSIS — F410 Panic disorder [episodic paroxysmal anxiety] without agoraphobia: Secondary | ICD-10-CM | POA: Diagnosis not present

## 2020-10-29 DIAGNOSIS — I6359 Cerebral infarction due to unspecified occlusion or stenosis of other cerebral artery: Secondary | ICD-10-CM | POA: Diagnosis not present

## 2020-10-29 DIAGNOSIS — I639 Cerebral infarction, unspecified: Secondary | ICD-10-CM | POA: Diagnosis not present

## 2020-10-29 DIAGNOSIS — E785 Hyperlipidemia, unspecified: Secondary | ICD-10-CM | POA: Diagnosis not present

## 2020-10-29 DIAGNOSIS — Z9282 Status post administration of tPA (rtPA) in a different facility within the last 24 hours prior to admission to current facility: Secondary | ICD-10-CM | POA: Diagnosis not present

## 2020-10-29 DIAGNOSIS — Z8673 Personal history of transient ischemic attack (TIA), and cerebral infarction without residual deficits: Secondary | ICD-10-CM | POA: Diagnosis not present

## 2020-10-29 DIAGNOSIS — Z720 Tobacco use: Secondary | ICD-10-CM | POA: Diagnosis not present

## 2020-10-29 DIAGNOSIS — I10 Essential (primary) hypertension: Secondary | ICD-10-CM | POA: Diagnosis not present

## 2020-10-29 DIAGNOSIS — J449 Chronic obstructive pulmonary disease, unspecified: Secondary | ICD-10-CM | POA: Diagnosis not present

## 2020-10-30 ENCOUNTER — Other Ambulatory Visit: Payer: Self-pay

## 2020-10-30 ENCOUNTER — Other Ambulatory Visit: Payer: Self-pay | Admitting: Adult Health

## 2020-10-30 DIAGNOSIS — J449 Chronic obstructive pulmonary disease, unspecified: Secondary | ICD-10-CM

## 2020-10-30 DIAGNOSIS — Z889 Allergy status to unspecified drugs, medicaments and biological substances status: Secondary | ICD-10-CM

## 2020-10-30 DIAGNOSIS — F411 Generalized anxiety disorder: Secondary | ICD-10-CM

## 2020-10-30 MED ORDER — MONTELUKAST SODIUM 10 MG PO TABS: ORAL_TABLET | ORAL | 1 refills | Status: AC

## 2020-10-30 MED ORDER — DALIRESP 500 MCG PO TABS: ORAL_TABLET | ORAL | 3 refills | Status: AC

## 2020-11-01 ENCOUNTER — Encounter: Payer: Self-pay | Admitting: Physician Assistant

## 2020-11-01 ENCOUNTER — Ambulatory Visit (INDEPENDENT_AMBULATORY_CARE_PROVIDER_SITE_OTHER): Payer: Medicare Other | Admitting: Internal Medicine

## 2020-11-01 ENCOUNTER — Other Ambulatory Visit: Payer: Self-pay | Admitting: Hospice and Palliative Medicine

## 2020-11-01 VITALS — BP 111/73 | HR 65 | Temp 97.8°F | Resp 16 | Ht 65.0 in | Wt 180.0 lb

## 2020-11-01 DIAGNOSIS — I639 Cerebral infarction, unspecified: Secondary | ICD-10-CM | POA: Diagnosis not present

## 2020-11-01 DIAGNOSIS — Z09 Encounter for follow-up examination after completed treatment for conditions other than malignant neoplasm: Secondary | ICD-10-CM

## 2020-11-01 DIAGNOSIS — F411 Generalized anxiety disorder: Secondary | ICD-10-CM | POA: Diagnosis not present

## 2020-11-01 DIAGNOSIS — I10 Essential (primary) hypertension: Secondary | ICD-10-CM

## 2020-11-01 DIAGNOSIS — I6381 Other cerebral infarction due to occlusion or stenosis of small artery: Secondary | ICD-10-CM

## 2020-11-01 NOTE — Progress Notes (Unsigned)
[facility] [address] [place], [place] [address]  Internal MEDICINE  Office Visit Note  Patient Name: Alicia Rivera  [id number]  [id number]  Date of Service: 11/07/2020   TRANSITIONAL CARE MANAGEMENT   Chief Complaint  Patient presents with  . Hospitalization Follow-up    Stroke , lipitor is expensive   . Hypertension  . COPD     HPI Pt is here for recent hospital follow up. She had a stroke found to be a left thalamic infarct. She feels slower and a little off balance, but otherwise is doing well today. The stroke affected her right side, but it is much improved. She is able to use both her RUE and RLE now. She reports that she first felt dizzy and then noticed her right side wasn't working right. She went to the ED and they started TPA and after 24 hours it started improving. The hospital started her on ASA and 80mg  Lipitor however she states she cannot afford the lipitor. They stopped her losartan-HCTZ and her metoprolol succinate since her BP was well controlled without it. She states she is still taking her other medications including her inhalers, cymbalta, and seroquel. She is also taking xanax more frequently since this happened as she has been more anxious. She has taken 1 tab of .25mg  xanax since leaving the hospital. She does not have a neurology appt set up yet. They told her they would call to set one up for her in 2 months and she has the number to contact them if she doesn't hear from them within 2 weeks of d/c. Patient declines needing home health therapy at this point, she states she can do the exercises on her own. Daughter in law is there all day and her son is there in the afternoon. She is also staying active around her grandbabies. Her breathing has been better since getting home with sats 95-97%. She is sleeping much better since being home too. She still smokes 1 cigarette a month, but has not smoked since leaving the hospital. Denies dizziness, vision  changes, or headaches.  Current Medication: Outpatient Encounter Medications as of 11/01/2020  Medication Sig  . albuterol (PROVENTIL) (2.5 MG/3ML) 0.083% nebulizer solution Take 3 mLs (2.5 mg total) by nebulization every 6 (six) hours as needed for wheezing or shortness of breath. DX J44.9  . ALPRAZolam (XANAX) 0.25 MG tablet Take 1 tablet (0.25 mg total) by mouth 2 (two) times daily as needed for anxiety.  Marland Kitchen aspirin EC 81 MG tablet Take 81 mg by mouth daily.  . budesonide-formoterol (SYMBICORT) 160-4.5 MCG/ACT inhaler Inhale 1 puff into the lungs 2 (two) times daily.  . cetirizine (ZYRTEC) 10 MG tablet TAKE 1 TABLET(10 MG) BY MOUTH DAILY  . DULoxetine (CYMBALTA) 30 MG capsule Take 1 capsule (30 mg total) by mouth daily.  Marland Kitchen guaiFENesin-dextromethorphan (ROBITUSSIN DM) 100-10 MG/5ML syrup Take 5 mLs by mouth every 4 (four) hours as needed for cough.  Marland Kitchen ipratropium (ATROVENT) 0.02 % nebulizer solution USE 1 VIAL VIA NEBULIZER EVERY 6 HOURS AS NEEDED FOR WHEEZING OR SHORTNESS OF BREATH  . levofloxacin (LEVAQUIN) 500 MG tablet Take 1 tablet (500 mg total) by mouth daily.  . montelukast (SINGULAIR) 10 MG tablet TAKE 1 TABLET(10 MG) BY MOUTH AT BEDTIME  . predniSONE (STERAPRED UNI-PAK 21 TAB) 10 MG (21) TBPK tablet As directed  . PROAIR HFA 108 (90 Base) MCG/ACT inhaler Inhale 2 puffs into the lungs every 6 (six) hours as needed for wheezing or  shortness of breath.  . QUEtiapine (SEROQUEL) 25 MG tablet TAKE 1 TABLET(25 MG) BY MOUTH DAILY AS NEEDED  . roflumilast (DALIRESP) 500 MCG TABS tablet TAKE 1 TABLET(500 MCG) BY MOUTH DAILY  . tiotropium (SPIRIVA) 18 MCG inhalation capsule Place 18 mcg into inhaler and inhale daily.  Marland Kitchen losartan-hydrochlorothiazide (HYZAAR) 50-12.5 MG tablet Take 1 tablet by mouth daily. (Patient not taking: Reported on 11/01/2020)   No facility-administered encounter medications on file as of 11/01/2020.    Surgical History: Past Surgical History:  Procedure Laterality Date   . CATARACT EXTRACTION W/PHACO Left 12/13/2019   Procedure: CATARACT EXTRACTION PHACO AND INTRAOCULAR LENS PLACEMENT (IOC) LEFT;  Surgeon: Galen Manila, MD;  Location: [facility];  Service: Ophthalmology;  Laterality: Left;  CDE 4.90 U/S 0:36.8  . CATARACT EXTRACTION W/PHACO Right 01/03/2020   Procedure: CATARACT EXTRACTION PHACO AND INTRAOCULAR LENS PLACEMENT (IOC) RIGHT 6.01 00:44.5;  Surgeon: Galen Manila, MD;  Location[facility];  Service: Ophthalmology;  Laterality: Right;    Medical History: Past Medical History:  Diagnosis Date  . Anxiety   . COPD (chronic obstructive pulmonary disease) (HCC)   . HTN (hypertension)   . Pneumonia 09/18/2017  . Tobacco abuse     Family History: Family History  Problem Relation Age of Onset  . Emphysema Brother   . Lung disease Mother   . Heart disease Father     Social History   Socioeconomic History  . Marital status: Single    Spouse name: Not on file  . Number of children: Not on file  . Years of education: Not on file  . Highest education level: Not on file  Occupational History  . Occupation: disabled  Tobacco Use  . Smoking status: Current Some Day Smoker    Years: 50.00    Types: Cigarettes  . Smokeless tobacco: Never Used  . Tobacco comment: 5 cigs/week. down to 2 a week 07/17/20  Vaping Use  . Vaping Use: Never used  Substance and Sexual Activity  . Alcohol use: No  . Drug use: No  . Sexual activity: Never  Other Topics Concern  . Not on file  Social History Narrative  . Not on file   Social Determinants of Health   Financial Resource Strain: Not on file  Food Insecurity: Not on file  Transportation Needs: Not on file  Physical Activity: Not on file  Stress: Not on file  Social Connections: Not on file  Intimate Partner Violence: Not on file      Review of Systems  Constitutional: Negative for chills, fatigue and unexpected weight change.  HENT: Negative for congestion,  postnasal drip, rhinorrhea, sneezing and sore throat.   Eyes: Negative for redness.  Respiratory: Negative for cough, chest tightness and shortness of breath.   Cardiovascular: Negative for chest pain and palpitations.  Gastrointestinal: Negative for abdominal pain, constipation, diarrhea, nausea and vomiting.  Genitourinary: Negative for dysuria and frequency.  Musculoskeletal: Negative for arthralgias, back pain, gait problem, joint swelling and neck pain.  Skin: Negative for rash.  Neurological: Negative.  Negative for tremors and numbness.       Reports she feels a little less steady and slower moving around, but has full function of her extremities now  Hematological: Negative for adenopathy. Does not bruise/bleed easily.  Psychiatric/Behavioral: Negative for behavioral problems (Depression), sleep disturbance and suicidal ideas. The patient is nervous/anxious.     Vital Signs: BP 111/73   Pulse 65   Temp 97.8 F (36.6 C)  Resp 16   Ht 5\' 5"  (1.651 m)   Wt 180 lb (81.6 kg)   SpO2 95%   BMI 29.95 kg/m    Physical Exam Constitutional:      General: She is not in acute distress.    Appearance: She is well-developed. She is not diaphoretic.  HENT:     Head: Normocephalic and atraumatic.     Mouth/Throat:     Pharynx: No oropharyngeal exudate.  Eyes:     Pupils: Pupils are equal, round, and reactive to light.  Neck:     Thyroid: No thyromegaly.     Vascular: No JVD.     Trachea: No tracheal deviation.  Cardiovascular:     Rate and Rhythm: Normal rate and regular rhythm.     Heart sounds: Normal heart sounds. No murmur heard. No friction rub. No gallop.   Pulmonary:     Effort: Pulmonary effort is normal. No respiratory distress.     Breath sounds: No wheezing or rales.  Chest:     Chest wall: No tenderness.  Abdominal:     General: Bowel sounds are normal.     Palpations: Abdomen is soft.  Musculoskeletal:        General: Normal range of motion.     Cervical  back: Normal range of motion and neck supple.  Lymphadenopathy:     Cervical: No cervical adenopathy.  Skin:    General: Skin is warm and dry.  Neurological:     General: No focal deficit present.     Mental Status: She is alert and oriented to person, place, and time.     Cranial Nerves: No cranial nerve deficit.     Motor: No weakness.     Gait: Gait normal.  Psychiatric:        Behavior: Behavior normal.        Thought Content: Thought content normal.        Judgment: Judgment normal.       Assessment/Plan: 1. Encounter for examination following treatment at hospital Reviewed hospital course and changes to medication. She was taken off Hyzaar and metoprolol and started on ASA and Lipitor.  2. Left thalamic infarction (HCC) Pt was found to have a stroke on 1/21 that was treated with TPA. Pt has regained full use of her right side, but does feel she is slower and less steady than previously. She denies wanting home health therapy, but will do home exercises on her own with the help of her family. She will continue to take 81mg  ASA and 80 mg lipitor.  3. Generalized anxiety disorder Pt has been more anxious since leaving the hospital. She had previously not been taking Xanax very often, but has been taking 1 tab daily since discharge. She will only continue to take them as needed.  4. Essential hypertension, benign BP is well controlled without medication currently. She was taken off of Hyzaar and metoprolol succinate in the hospital due to low BP. Will continue to monitor as she remains off of them.  General Counseling: Rut verbalizes understanding of the findings of todays visit and agrees with plan of treatment. I have discussed any further diagnostic evaluation that may be needed or ordered today. We also reviewed her medications today. she has been encouraged to call the office with any questions or concerns that should arise related to todays  visit.    Counseling: Cardiac risk factor modification:  1. Control blood pressure. 2. Exercise as prescribed. 3. Follow low sodium,  low fat diet. and low fat and low cholestrol diet. 4. Take ASA 81mg  once a day. 5. Restricted calories diet to lose weight. [place] Controlled Substance Database was reviewed by me.    I have reviewed all medical records from hospital follow up including radiology reports and consults from other physicians. Appropriate follow up diagnostics will be scheduled as needed. Patient/ Family understands the plan of treatment. Time spent 45 minutes.   Dr , MD Internal Medicine

## 2020-11-08 ENCOUNTER — Other Ambulatory Visit: Payer: Self-pay | Admitting: Adult Health

## 2020-11-08 DIAGNOSIS — J449 Chronic obstructive pulmonary disease, unspecified: Secondary | ICD-10-CM

## 2020-11-08 DIAGNOSIS — J4489 Other specified chronic obstructive pulmonary disease: Secondary | ICD-10-CM

## 2020-11-08 DIAGNOSIS — Z889 Allergy status to unspecified drugs, medicaments and biological substances status: Secondary | ICD-10-CM

## 2020-11-08 DIAGNOSIS — I10 Essential (primary) hypertension: Secondary | ICD-10-CM

## 2020-11-13 ENCOUNTER — Other Ambulatory Visit: Payer: Self-pay | Admitting: Adult Health

## 2020-11-13 DIAGNOSIS — F411 Generalized anxiety disorder: Secondary | ICD-10-CM

## 2020-11-19 ENCOUNTER — Other Ambulatory Visit: Payer: Self-pay

## 2020-11-19 ENCOUNTER — Ambulatory Visit (INDEPENDENT_AMBULATORY_CARE_PROVIDER_SITE_OTHER): Payer: Medicare Other | Admitting: Hospice and Palliative Medicine

## 2020-11-19 ENCOUNTER — Encounter: Payer: Self-pay | Admitting: Hospice and Palliative Medicine

## 2020-11-19 VITALS — BP 140/76 | HR 64 | Temp 97.6°F | Resp 16 | Ht 65.0 in | Wt 181.0 lb

## 2020-11-19 DIAGNOSIS — R0602 Shortness of breath: Secondary | ICD-10-CM

## 2020-11-19 DIAGNOSIS — J440 Chronic obstructive pulmonary disease with acute lower respiratory infection: Secondary | ICD-10-CM

## 2020-11-19 DIAGNOSIS — F411 Generalized anxiety disorder: Secondary | ICD-10-CM | POA: Diagnosis not present

## 2020-11-19 DIAGNOSIS — J209 Acute bronchitis, unspecified: Secondary | ICD-10-CM

## 2020-11-19 DIAGNOSIS — I10 Essential (primary) hypertension: Secondary | ICD-10-CM

## 2020-11-19 MED ORDER — LOSARTAN POTASSIUM 25 MG PO TABS: 25.0000 mg | ORAL_TABLET | Freq: Every day | ORAL | 0 refills | Status: DC

## 2020-11-19 MED ORDER — BUDESONIDE-FORMOTEROL FUMARATE 160-4.5 MCG/ACT IN AERO: 1.0000 | INHALATION_SPRAY | Freq: Two times a day (BID) | RESPIRATORY_TRACT | 4 refills | Status: AC

## 2020-11-19 MED ORDER — QUETIAPINE FUMARATE 25 MG PO TABS: 25.0000 mg | ORAL_TABLET | Freq: Every day | ORAL | 1 refills | Status: AC

## 2020-11-19 MED ORDER — PREDNISONE 10 MG PO TABS: ORAL_TABLET | ORAL | 0 refills | Status: DC

## 2020-11-19 MED ORDER — AZITHROMYCIN 250 MG PO TABS: ORAL_TABLET | ORAL | 0 refills | Status: DC

## 2020-11-19 MED ORDER — TIOTROPIUM BROMIDE MONOHYDRATE 18 MCG IN CAPS
18.0000 ug | ORAL_CAPSULE | Freq: Every day | RESPIRATORY_TRACT | 1 refills | Status: AC
Start: 2020-11-19 — End: ?

## 2020-11-19 NOTE — Patient Instructions (Signed)
Chronic Obstructive Pulmonary Disease  Chronic obstructive pulmonary disease (COPD) is a long-term (chronic) lung problem. When you have COPD, it is hard for air to get in and out of your lungs. Usually the condition gets worse over time, and your lungs will never return to normal. There are things you can do to keep yourself as healthy as possible. What are the causes?  Smoking. This is the most common cause.  Certain genes passed from parent to child (inherited). What increases the risk?  Being exposed to secondhand smoke from cigarettes, pipes, or cigars.  Being exposed to chemicals and other irritants, such as fumes and dust in the work environment.  Having chronic lung conditions or infections. What are the signs or symptoms?  Shortness of breath, especially during physical activity.  A long-term cough with a large amount of thick mucus. Sometimes, the cough may not have any mucus (dry cough).  Wheezing.  Breathing quickly.  Skin that looks gray or blue, especially in the fingers, toes, or lips.  Feeling tired (fatigue).  Weight loss.  Chest tightness.  Having infections often.  Episodes when breathing symptoms become much worse (exacerbations). At the later stages of this disease, you may have swelling in the ankles, feet, or legs. How is this treated?  Taking medicines.  Quitting smoking, if you smoke.  Rehabilitation. This includes steps to make your body work better. It may involve a team of specialists.  Doing exercises.  Making changes to your diet.  Using oxygen.  Lung surgery.  Lung transplant.  Comfort measures (palliative care). Follow these instructions at home: Medicines  Take over-the-counter and prescription medicines only as told by your doctor.  Talk to your doctor before taking any cough or allergy medicines. You may need to avoid medicines that cause your lungs to be dry. Lifestyle  If you smoke, stop smoking. Smoking makes the  problem worse.  Do not smoke or use any products that contain nicotine or tobacco. If you need help quitting, ask your doctor.  Avoid being around things that make your breathing worse. This may include smoke, chemicals, and fumes.  Stay active, but remember to rest as well.  Learn and use tips on how to manage stress and control your breathing.  Make sure you get enough sleep. Most adults need at least 7 hours of sleep every night.  Eat healthy foods. Eat smaller meals more often. Rest before meals. Controlled breathing Learn and use tips on how to control your breathing as told by your doctor. Try:  Breathing in (inhaling) through your nose for 1 second. Then, pucker your lips and breath out (exhale) through your lips for 2 seconds.  Putting one hand on your belly (abdomen). Breathe in slowly through your nose for 1 second. Your hand on your belly should move out. Pucker your lips and breathe out slowly through your lips. Your hand on your belly should move in as you breathe out.   Controlled coughing Learn and use controlled coughing to clear mucus from your lungs. Follow these steps: 1. Lean your head a little forward. 2. Breathe in deeply. 3. Try to hold your breath for 3 seconds. 4. Keep your mouth slightly open while coughing 2 times. 5. Spit any mucus out into a tissue. 6. Rest and do the steps again 1 or 2 times as needed. General instructions  Make sure you get all the shots (vaccines) that your doctor recommends. Ask your doctor about a flu shot and a pneumonia shot.    Use oxygen therapy and pulmonary rehabilitation if told by your doctor. If you need home oxygen therapy, ask your doctor if you should buy a tool to measure your oxygen level (oximeter).  Make a COPD action plan with your doctor. This helps you to know what to do if you feel worse than usual.  Manage any other conditions you have as told by your doctor.  Avoid going outside when it is very hot, cold, or  humid.  Avoid people who have a sickness you can catch (contagious).  Keep all follow-up visits. Contact a doctor if:  You cough up more mucus than usual.  There is a change in the color or thickness of the mucus.  It is harder to breathe than usual.  Your breathing is faster than usual.  You have trouble sleeping.  You need to use your medicines more often than usual.  You have trouble doing your normal activities such as getting dressed or walking around the house. Get help right away if:  You have shortness of breath while resting.  You have shortness of breath that stops you from: ? Being able to talk. ? Doing normal activities.  Your chest hurts for longer than 5 minutes.  Your skin color is more blue than usual.  Your pulse oximeter shows that you have low oxygen for longer than 5 minutes.  You have a fever.  You feel too tired to breathe normally. These symptoms may represent a serious problem that is an emergency. Do not wait to see if the symptoms will go away. Get medical help right away. Call your local emergency services (911 in the [place] Do not drive yourself to the hospital. Summary  Chronic obstructive pulmonary disease (COPD) is a long-term lung problem.  The way your lungs work will never return to normal. Usually the condition gets worse over time. There are things you can do to keep yourself as healthy as possible.  Take over-the-counter and prescription medicines only as told by your doctor.  If you smoke, stop. Smoking makes the problem worse. This information is not intended to replace advice given to you by your health care provider. Make sure you discuss any questions you have with your health care provider. Document Revised: 07/31/2020 Document Reviewed: 07/31/2020 Elsevier Patient Education  2021 [employer]vier Inc.   

## 2020-11-19 NOTE — Progress Notes (Signed)
[facility] [address] [place], [place] [address]  Pulmonary Sleep Medicine   Office Visit Note  Patient Name: Alicia Rivera DOB: [birth year] MRN [id number]  Date of Service: 11/19/2020  Complaints/HPI: Patient is here for routine pulmonary follow-up C/o coughing, nasal and chest congestion--symptoms similar to acute exacerbation of chronic bronchitis she has experienced in the past Continues to use Spiriva as well as Symbicort daily--also on Daliresp Unfortunately she does continue to smoke about half a pack per day PFT 06/2020--severe obstructive lung disease Continues to recover from recent ischemic stroke--hospitalized and discharged home 10/29/20 At discharge her BP medications were help due to hypo/nomotensive pressures during hospitalization Routinely monitors BP readings at home and over the last week have noticed readings are gradually increasing--averaging 140-145/80/85 reports dull front headaches  ROS  General: (-) fever, (-) chills, (-) night sweats, (-) weakness Skin: (-) rashes, (-) itching,. Eyes: (-) visual changes, (-) redness, (-) itching. Nose and Sinuses: (-) nasal stuffiness or itchiness, (-) postnasal drip, (-) nosebleeds, (-) sinus trouble. Mouth and Throat: (-) sore throat, (-) hoarseness. Neck: (-) swollen glands, (-) enlarged thyroid, (-) neck pain. Respiratory: + cough, (-) bloody sputum, + shortness of breath, - wheezing. Cardiovascular: - ankle swelling, (-) chest pain. Lymphatic: (-) lymph node enlargement. Neurologic: (-) numbness, (-) tingling. Psychiatric: (-) anxiety, (-) depression   Current Medication: Outpatient Encounter Medications as of 11/19/2020  Medication Sig  . azithromycin (ZITHROMAX) 250 MG tablet Take one tab po qd for 14 days  . losartan (COZAAR) 25 MG tablet Take 1 tablet (25 mg total) by mouth daily.  . predniSONE (DELTASONE) 10 MG tablet Take 1 tablet three times a day with a meal for three for three days, take  1 tablet by twice daily with a meal for 3 days, take 1 tablet once daily with a meal for 3 days  . QUEtiapine (SEROQUEL) 25 MG tablet Take 1 tablet (25 mg total) by mouth at bedtime.  Marland Kitchen albuterol (PROVENTIL) (2.5 MG/3ML) 0.083% nebulizer solution Take 3 mLs (2.5 mg total) by nebulization every 6 (six) hours as needed for wheezing or shortness of breath. DX J44.9  . ALPRAZolam (XANAX) 0.25 MG tablet Take 1 tablet (0.25 mg total) by mouth 2 (two) times daily as needed for anxiety.  Marland Kitchen aspirin EC 81 MG tablet Take 81 mg by mouth daily.  . budesonide-formoterol (SYMBICORT) 160-4.5 MCG/ACT inhaler Inhale 1 puff into the lungs 2 (two) times daily.  . cetirizine (ZYRTEC) 10 MG tablet TAKE 1 TABLET(10 MG) BY MOUTH DAILY  . DULoxetine (CYMBALTA) 30 MG capsule Take 1 capsule (30 mg total) by mouth daily.  Marland Kitchen guaiFENesin-dextromethorphan (ROBITUSSIN DM) 100-10 MG/5ML syrup Take 5 mLs by mouth every 4 (four) hours as needed for cough.  Marland Kitchen ipratropium (ATROVENT) 0.02 % nebulizer solution USE 1 VIAL VIA NEBULIZER EVERY 6 HOURS AS NEEDED FOR WHEEZING OR SHORTNESS OF BREATH  . montelukast (SINGULAIR) 10 MG tablet TAKE 1 TABLET(10 MG) BY MOUTH AT BEDTIME  . PROAIR HFA 108 (90 Base) MCG/ACT inhaler Inhale 2 puffs into the lungs every 6 (six) hours as needed for wheezing or shortness of breath.  . roflumilast (DALIRESP) 500 MCG TABS tablet TAKE 1 TABLET(500 MCG) BY MOUTH DAILY  . tiotropium (SPIRIVA) 18 MCG inhalation capsule Place 1 capsule (18 mcg total) into inhaler and inhale daily.  . [DISCONTINUED] budesonide-formoterol (SYMBICORT) 160-4.5 MCG/ACT inhaler Inhale 1 puff into the lungs 2 (two) times daily.  . [DISCONTINUED] levofloxacin (LEVAQUIN) 500 MG tablet Take 1  tablet (500 mg total) by mouth daily.  . [DISCONTINUED] losartan-hydrochlorothiazide (HYZAAR) 50-12.5 MG tablet Take 1 tablet by mouth daily. (Patient not taking: Reported on 11/01/2020)  . [DISCONTINUED] predniSONE (STERAPRED UNI-PAK 21 TAB) 10 MG (21)  TBPK tablet As directed  . [DISCONTINUED] QUEtiapine (SEROQUEL) 25 MG tablet TAKE 1 TABLET(25 MG) BY MOUTH DAILY AS NEEDED  . [DISCONTINUED] tiotropium (SPIRIVA) 18 MCG inhalation capsule Place 18 mcg into inhaler and inhale daily.   No facility-administered encounter medications on file as of 11/19/2020.    Surgical History: Past Surgical History:  Procedure Laterality Date  . CATARACT EXTRACTION W/PHACO Left 12/13/2019   Procedure: CATARACT EXTRACTION PHACO AND INTRAOCULAR LENS PLACEMENT (IOC) LEFT;  Surgeon: Galen Manila, MD;  Location: [facility];  Service: Ophthalmology;  Laterality: Left;  CDE 4.90 U/S 0:36.8  . CATARACT EXTRACTION W/PHACO Right 01/03/2020   Procedure: CATARACT EXTRACTION PHACO AND INTRAOCULAR LENS PLACEMENT (IOC) RIGHT 6.01 00:44.5;  Surgeon: Galen Manila, MD;  Location: [facility];  Service: Ophthalmology;  Laterality: Right;    Medical History: Past Medical History:  Diagnosis Date  . Anxiety   . COPD (chronic obstructive pulmonary disease) (HCC)   . HTN (hypertension)   . Pneumonia 09/18/2017  . Tobacco abuse     Family History: Family History  Problem Relation Age of Onset  . Emphysema Brother   . Lung disease Mother   . Heart disease Father     Social History: Social History   Socioeconomic History  . Marital status: Single    Spouse name: Not on file  . Number of children: Not on file  . Years of education: Not on file  . Highest education level: Not on file  Occupational History  . Occupation: disabled  Tobacco Use  . Smoking status: Current Some Day Smoker    Years: 50.00    Types: Cigarettes  . Smokeless tobacco: Never Used  . Tobacco comment: 2 a week  Vaping Use  . Vaping Use: Never used  Substance and Sexual Activity  . Alcohol use: No  . Drug use: No  . Sexual activity: Never  Other Topics Concern  . Not on file  Social History Narrative  . Not on file   Social Determinants of Health    Financial Resource Strain: Not on file  Food Insecurity: Not on file  Transportation Needs: Not on file  Physical Activity: Not on file  Stress: Not on file  Social Connections: Not on file  Intimate Partner Violence: Not on file    Vital Signs: Blood pressure 140/76, pulse 64, temperature 97.6 F (36.4 C), resp. rate 16, height 5\' 5"  (1.651 m), weight 181 lb (82.1 kg), SpO2 95 %.  Examination: General Appearance: The patient is well-developed, well-nourished, and in no distress. Skin: Gross inspection of skin unremarkable. Head: normocephalic, no gross deformities. Eyes: no gross deformities noted. ENT: ears appear grossly normal no exudates. Neck: Supple. No thyromegaly. No LAD. Respiratory: Diminished wheezing prominent in bilateral bases, no rhonchi or rales noted. Cardiovascular: Normal S1 and S2 without murmur or rub. Extremities: No cyanosis. pulses are equal. Neurologic: Alert and oriented. No involuntary movements.  LABS: No results found for this or any previous visit (from the past 2160 hour(s)).  Radiology: No results found.  No results found.  No results found.    Assessment and Plan: Patient Active Problem List   Diagnosis Date Noted  . Need for vaccination against Streptococcus pneumoniae using pneumococcal conjugate vaccine 7 08/18/2020  . Atherosclerosis  of aorta (HCC) 06/26/2020  . Cough 08/12/2019  . Bronchitis 06/03/2019  . Obstructive chronic bronchitis without exacerbation (HCC) 06/03/2019  . Generalized anxiety disorder 06/03/2019  . Pneumonia 06/04/2018  . Elevated troponin level 01/30/2018  . Encephalopathy, metabolic 01/25/2018  . Acute on chronic respiratory failure with hypercapnia (HCC) 01/08/2018  . Anxiety 01/03/2018  . Essential hypertension 01/03/2018  . Smoker   . Acute encephalopathy   . Acute respiratory failure with hypoxemia (HCC) 04/16/2017  . COPD exacerbation (HCC)   . Palliative care by specialist   . Goals of  care, counseling/discussion   . Respiratory insufficiency 01/14/2014  . Hypoxemia 01/14/2014    1. Obstructive chronic bronchitis with acute bronchitis (HCC) Treat acute exacerbation with azithromycin as well as low dose short course prednisone Requesting refills of Symbicort as well as Spiriva Once acute exacerbation resolved consider low dose CT lung cancer screening - azithromycin (ZITHROMAX) 250 MG tablet; Take one tab po qd for 14 days  Dispense: 14 tablet; Refill: 0 - predniSONE (DELTASONE) 10 MG tablet; Take 1 tablet three times a day with a meal for three for three days, take 1 tablet by twice daily with a meal for 3 days, take 1 tablet once daily with a meal for 3 days  Dispense: 18 tablet; Refill: 0 - tiotropium (SPIRIVA) 18 MCG inhalation capsule; Place 1 capsule (18 mcg total) into inhaler and inhale daily.  Dispense: 60 capsule; Refill: 1 - budesonide-formoterol (SYMBICORT) 160-4.5 MCG/ACT inhaler; Inhale 1 puff into the lungs 2 (two) times daily.  Dispense: 10.2 g; Refill: 4  2. Shortness of breath Spirometry stable today--FEV1 1.3L, 45% predicted value - Spirometry with Graph  3. Essential hypertension, benign Restart lowered dose losartan--advised to monitor BP closely Will need follow-up with PCP for further management - losartan (COZAAR) 25 MG tablet; Take 1 tablet (25 mg total) by mouth daily.  Dispense: 90 tablet; Refill: 0  4. Generalized anxiety disorder Stable, requesting refills, will need PCP follow-up - QUEtiapine (SEROQUEL) 25 MG tablet; Take 1 tablet (25 mg total) by mouth at bedtime.  Dispense: 90 tablet; Refill: 1  General Counseling: I have discussed the findings of the evaluation and examination with Brindy.  I have also discussed any further diagnostic evaluation thatmay be needed or ordered today. Kelcey verbalizes understanding of the findings of todays visit. We also reviewed her medications today and discussed drug interactions and side effects including  but not limited excessive drowsiness and altered mental states. We also discussed that there is always a risk not just to her but also people around her. she has been encouraged to call the office with any questions or concerns that should arise related to todays visit.  Orders Placed This Encounter  Procedures  . Spirometry with Graph    Order Specific Question:   Where should this test be performed?    Answer:   [facility]     Time spent: 52  I have personally obtained a history, examined the patient, evaluated laboratory and imaging results, formulated the assessment and plan and placed orders. This patient was seen by Leeanne Deed AGNP-C in Collaboration with Dr. Freda Munro as a part of collaborative care agreement.    Yevonne Pax, MD [facility] Sleep medicine

## 2020-11-29 ENCOUNTER — Ambulatory Visit (INDEPENDENT_AMBULATORY_CARE_PROVIDER_SITE_OTHER): Payer: Medicare Other | Admitting: Physician Assistant

## 2020-11-29 ENCOUNTER — Other Ambulatory Visit: Payer: Self-pay | Admitting: Hospice and Palliative Medicine

## 2020-11-29 ENCOUNTER — Other Ambulatory Visit: Payer: Self-pay

## 2020-11-29 ENCOUNTER — Encounter: Payer: Self-pay | Admitting: Physician Assistant

## 2020-11-29 DIAGNOSIS — F411 Generalized anxiety disorder: Secondary | ICD-10-CM

## 2020-11-29 DIAGNOSIS — I10 Essential (primary) hypertension: Secondary | ICD-10-CM

## 2020-11-29 DIAGNOSIS — J42 Unspecified chronic bronchitis: Secondary | ICD-10-CM | POA: Diagnosis not present

## 2020-11-29 DIAGNOSIS — I639 Cerebral infarction, unspecified: Secondary | ICD-10-CM

## 2020-11-29 DIAGNOSIS — F17218 Nicotine dependence, cigarettes, with other nicotine-induced disorders: Secondary | ICD-10-CM

## 2020-11-29 DIAGNOSIS — I6381 Other cerebral infarction due to occlusion or stenosis of small artery: Secondary | ICD-10-CM

## 2020-11-29 NOTE — Progress Notes (Signed)
[facility] [address] [place], [place] [address]  Internal MEDICINE  Office Visit Note  Patient Name: Alicia Rivera  [id number]  [id number]  Date of Service: 11/29/2020  Chief Complaint  Patient presents with  . Follow-up  . Hypertension  . COPD  . Anxiety    HPI Pt returns today for f/u. She has no complaints. -She checks her BP at home and is usually 130s systolic. She had been taken off her BP meds while in the hospital from a CVA and was found to be hypotensive. She was restarted on losartan when she came in for her pulm visit and her BP was found to be high. She was also experiences headaches, which have now resolved.  -Anxiety is still problematic. She takes cymbalta and seroquel, and uses xanax almost daily since leaving the hospital. She does not like to take them often and will skip days as able. She has been more anxious while caring for her brother brother who is verr sick. She also lost her nephew not too long ago and he was the other person helping to care for her brother. -She was having a COPD exacerbation last visit that is much better now. She has a few more days of ABX left and will finish them. Smoking has improved some. She states she is only smoking about 1 cig a week. Really just holds on to a cig without smoking it now. Less need to smoke when her anxiety is controlled. -She is still doing exercises at home that were given to her following her stroke and is doing well with no lasting effects from the stroke.  Current Medication: Outpatient Encounter Medications as of 11/29/2020  Medication Sig  . albuterol (PROVENTIL) (2.5 MG/3ML) 0.083% nebulizer solution Take 3 mLs (2.5 mg total) by nebulization every 6 (six) hours as needed for wheezing or shortness of breath. DX J44.9  . ALPRAZolam (XANAX) 0.25 MG tablet Take 1 tablet (0.25 mg total) by mouth 2 (two) times daily as needed for anxiety.  Marland Kitchen aspirin EC 81 MG tablet Take 81 mg by mouth daily.  Marland Kitchen  azithromycin (ZITHROMAX) 250 MG tablet Take one tab po qd for 14 days  . budesonide-formoterol (SYMBICORT) 160-4.5 MCG/ACT inhaler Inhale 1 puff into the lungs 2 (two) times daily.  . cetirizine (ZYRTEC) 10 MG tablet TAKE 1 TABLET(10 MG) BY MOUTH DAILY  . DULoxetine (CYMBALTA) 30 MG capsule Take 1 capsule (30 mg total) by mouth daily.  Marland Kitchen guaiFENesin-dextromethorphan (ROBITUSSIN DM) 100-10 MG/5ML syrup Take 5 mLs by mouth every 4 (four) hours as needed for cough.  Marland Kitchen ipratropium (ATROVENT) 0.02 % nebulizer solution USE 1 VIAL VIA NEBULIZER EVERY 6 HOURS AS NEEDED FOR WHEEZING OR SHORTNESS OF BREATH  . losartan (COZAAR) 25 MG tablet Take 1 tablet (25 mg total) by mouth daily.  . montelukast (SINGULAIR) 10 MG tablet TAKE 1 TABLET(10 MG) BY MOUTH AT BEDTIME  . predniSONE (DELTASONE) 10 MG tablet Take 1 tablet three times a day with a meal for three for three days, take 1 tablet by twice daily with a meal for 3 days, take 1 tablet once daily with a meal for 3 days  . PROAIR HFA 108 (90 Base) MCG/ACT inhaler Inhale 2 puffs into the lungs every 6 (six) hours as needed for wheezing or shortness of breath.  . QUEtiapine (SEROQUEL) 25 MG tablet Take 1 tablet (25 mg total) by mouth at bedtime.  . roflumilast (DALIRESP) 500 MCG TABS tablet TAKE 1 TABLET(500  MCG) BY MOUTH DAILY  . tiotropium (SPIRIVA) 18 MCG inhalation capsule Place 1 capsule (18 mcg total) into inhaler and inhale daily.   No facility-administered encounter medications on file as of 11/29/2020.    Surgical History: Past Surgical History:  Procedure Laterality Date  . CATARACT EXTRACTION W/PHACO Left 12/13/2019   Procedure: CATARACT EXTRACTION PHACO AND INTRAOCULAR LENS PLACEMENT (IOC) LEFT;  Surgeon: Galen Manila, MD;  Location: [facility];  Service: Ophthalmology;  Laterality: Left;  CDE 4.90 U/S 0:36.8  . CATARACT EXTRACTION W/PHACO Right 01/03/2020   Procedure: CATARACT EXTRACTION PHACO AND INTRAOCULAR LENS PLACEMENT (IOC)  RIGHT 6.01 00:44.5;  Surgeon: Galen Manila, MD;  Location: [facility];  Service: Ophthalmology;  Laterality: Right;    Medical History: Past Medical History:  Diagnosis Date  . Anxiety   . COPD (chronic obstructive pulmonary disease) (HCC)   . HTN (hypertension)   . Pneumonia 09/18/2017  . Tobacco abuse     Family History: Family History  Problem Relation Age of Onset  . Emphysema Brother   . Lung disease Mother   . Heart disease Father     Social History   Socioeconomic History  . Marital status: Single    Spouse name: Not on file  . Number of children: Not on file  . Years of education: Not on file  . Highest education level: Not on file  Occupational History  . Occupation: disabled  Tobacco Use  . Smoking status: Current Some Day Smoker    Years: 50.00    Types: Cigarettes  . Smokeless tobacco: Never Used  . Tobacco comment: 2 a week  Vaping Use  . Vaping Use: Never used  Substance and Sexual Activity  . Alcohol use: No  . Drug use: No  . Sexual activity: Never  Other Topics Concern  . Not on file  Social History Narrative  . Not on file   Social Determinants of Health   Financial Resource Strain: Not on file  Food Insecurity: Not on file  Transportation Needs: Not on file  Physical Activity: Not on file  Stress: Not on file  Social Connections: Not on file  Intimate Partner Violence: Not on file      Review of Systems  Constitutional: Negative for chills, fatigue and unexpected weight change.  HENT: Negative for congestion, postnasal drip, rhinorrhea, sneezing and sore throat.   Eyes: Negative for redness.  Respiratory: Positive for cough. Negative for chest tightness, shortness of breath and wheezing.   Cardiovascular: Negative for chest pain and palpitations.  Gastrointestinal: Negative for abdominal pain, constipation, diarrhea, nausea and vomiting.  Genitourinary: Negative for dysuria and frequency.  Musculoskeletal: Negative  for arthralgias, back pain, joint swelling and neck pain.  Skin: Negative for rash.  Neurological: Negative.  Negative for tremors, numbness and headaches.  Hematological: Negative for adenopathy. Does not bruise/bleed easily.  Psychiatric/Behavioral: Negative for behavioral problems (Depression), sleep disturbance and suicidal ideas. The patient is nervous/anxious.     Vital Signs: BP 136/76   Pulse 67   Temp (!) 97.2 F (36.2 C)   Resp 16   Ht 5\' 5"  (1.651 m)   Wt 182 lb 6.4 oz (82.7 kg)   SpO2 98%   BMI 30.35 kg/m    Physical Exam Vitals and nursing note reviewed.  Constitutional:      General: She is not in acute distress.    Appearance: She is well-developed. She is obese. She is not diaphoretic.  HENT:     Head:  Normocephalic and atraumatic.     Mouth/Throat:     Pharynx: No oropharyngeal exudate.  Eyes:     Pupils: Pupils are equal, round, and reactive to light.  Neck:     Thyroid: No thyromegaly.     Vascular: No JVD.     Trachea: No tracheal deviation.  Cardiovascular:     Rate and Rhythm: Normal rate and regular rhythm.     Heart sounds: Normal heart sounds. No murmur heard. No friction rub. No gallop.   Pulmonary:     Effort: Pulmonary effort is normal. No respiratory distress.     Breath sounds: No wheezing or rales.  Chest:     Chest wall: No tenderness.  Abdominal:     General: Bowel sounds are normal.     Palpations: Abdomen is soft.  Musculoskeletal:        General: Normal range of motion.     Cervical back: Normal range of motion and neck supple.  Lymphadenopathy:     Cervical: No cervical adenopathy.  Skin:    General: Skin is warm and dry.  Neurological:     Mental Status: She is alert and oriented to person, place, and time.     Cranial Nerves: No cranial nerve deficit.  Psychiatric:        Behavior: Behavior normal.        Thought Content: Thought content normal.        Judgment: Judgment normal.        Assessment/Plan: 1.  Essential hypertension, benign Restarted on 25 mg Losartan due to increasing BP. Stable on this dose. Pt will continue to monitor at home.  2. Generalized anxiety disorder Continue Seroquel and Cymbalta. Continue Xanax only as needed.  3. Chronic bronchitis, unspecified chronic bronchitis type (HCC) Continue current inhaler as prescribed. Acute exacerbation has improved significantly. Will finish ABX. Followed by pulm. Might need CT chest   4. Nicotine dependence, cigarettes, with other nicotine-induced disorders Pt has significantly cut back. Down to ~1 cig per week. Just likes to hold them but not actually smoke them. Does better when anxiety controlled. Does have days that are worse than others in which she will smoke more.  5. Left thalamic infarction (HCC) Pt is doing well and does exercises at home. No lasting deficits.   General Counseling: Parlee verbalizes understanding of the findings of todays visit and agrees with plan of treatment. I have discussed any further diagnostic evaluation that may be needed or ordered today. We also reviewed her medications today. she has been encouraged to call the office with any questions or concerns that should arise related to todays visit.    No orders of the defined types were placed in this encounter.   No orders of the defined types were placed in this encounter.  This patient was seen by Lynn Ito, PA-C in collaboration with Dr. Beverely Risen as a part of collaborative care agreement.   Total time spent:30 Minutes Time spent includes review of chart, medications, test results, and follow up plan with the patient.      Dr Lyndon Code Internal medicine

## 2020-12-10 ENCOUNTER — Other Ambulatory Visit: Payer: Self-pay

## 2020-12-10 DIAGNOSIS — F411 Generalized anxiety disorder: Secondary | ICD-10-CM

## 2020-12-10 MED ORDER — DULOXETINE HCL 30 MG PO CPEP: 30.0000 mg | ORAL_CAPSULE | Freq: Every day | ORAL | 1 refills | Status: AC

## 2020-12-14 ENCOUNTER — Other Ambulatory Visit: Payer: Self-pay | Admitting: Nurse Practitioner

## 2020-12-14 DIAGNOSIS — I10 Essential (primary) hypertension: Secondary | ICD-10-CM

## 2020-12-18 ENCOUNTER — Ambulatory Visit: Payer: Medicare Other | Admitting: Hospice and Palliative Medicine

## 2020-12-25 ENCOUNTER — Other Ambulatory Visit: Payer: Self-pay

## 2020-12-25 ENCOUNTER — Ambulatory Visit (INDEPENDENT_AMBULATORY_CARE_PROVIDER_SITE_OTHER): Payer: Medicare Other | Admitting: Internal Medicine

## 2020-12-25 ENCOUNTER — Encounter: Payer: Self-pay | Admitting: Internal Medicine

## 2020-12-25 VITALS — BP 128/78 | HR 103 | Temp 98.1°F | Resp 16 | Ht 65.0 in | Wt 181.0 lb

## 2020-12-25 DIAGNOSIS — R0602 Shortness of breath: Secondary | ICD-10-CM

## 2020-12-25 DIAGNOSIS — J440 Chronic obstructive pulmonary disease with acute lower respiratory infection: Secondary | ICD-10-CM

## 2020-12-25 DIAGNOSIS — F17218 Nicotine dependence, cigarettes, with other nicotine-induced disorders: Secondary | ICD-10-CM | POA: Diagnosis not present

## 2020-12-25 MED ORDER — AZITHROMYCIN 250 MG PO TABS: ORAL_TABLET | ORAL | 0 refills | Status: DC

## 2020-12-25 MED ORDER — PREDNISONE 10 MG PO TABS
ORAL_TABLET | ORAL | 0 refills | Status: DC
Start: 2020-12-25 — End: 2021-01-17

## 2020-12-25 NOTE — Progress Notes (Signed)
[facility] [address] [place], [place] [address]  Pulmonary Sleep Medicine   Office Visit Note  Patient Name: Alicia Rivera DOB: [birth year] MRN [id number]  Date of Service: 12/25/2020  Complaints/HPI: Having SOB and wheeze. She states that she does not smoke. Has noted that she needs the nebs more. Has been outdoors mostly on the porch. Patient states that she does not have any fevers or chills. She is around smokers. Denies having hemoptysis but has yellow sputum. She had not been vaccinated for covid. Patient states that she has no CP but has tightness  ROS  General: (-) fever, (-) chills, (-) night sweats, (-) weakness Skin: (-) rashes, (-) itching,. Eyes: (-) visual changes, (-) redness, (-) itching. Nose and Sinuses: (-) nasal stuffiness or itchiness, (-) postnasal drip, (-) nosebleeds, (-) sinus trouble. Mouth and Throat: (-) sore throat, (-) hoarseness. Neck: (-) swollen glands, (-) enlarged thyroid, (-) neck pain. Respiratory: + cough, (-) bloody sputum, + shortness of breath, + wheezing. Cardiovascular: - ankle swelling, (-) chest pain. Lymphatic: (-) lymph node enlargement. Neurologic: (-) numbness, (-) tingling. Psychiatric: (-) anxiety, (-) depression   Current Medication: Outpatient Encounter Medications as of 12/25/2020  Medication Sig  . albuterol (PROVENTIL) (2.5 MG/3ML) 0.083% nebulizer solution Take 3 mLs (2.5 mg total) by nebulization every 6 (six) hours as needed for wheezing or shortness of breath. DX J44.9  . ALPRAZolam (XANAX) 0.25 MG tablet Take 1 tablet (0.25 mg total) by mouth 2 (two) times daily as needed for anxiety.  Marland Kitchen aspirin EC 81 MG tablet Take 81 mg by mouth daily.  Marland Kitchen azithromycin (ZITHROMAX) 250 MG tablet Take one tab po qd for 14 days  . budesonide-formoterol (SYMBICORT) 160-4.5 MCG/ACT inhaler Inhale 1 puff into the lungs 2 (two) times daily.  . cetirizine (ZYRTEC) 10 MG tablet TAKE 1 TABLET(10 MG) BY MOUTH DAILY  . DULoxetine  (CYMBALTA) 30 MG capsule Take 1 capsule (30 mg total) by mouth daily.  Marland Kitchen guaiFENesin-dextromethorphan (ROBITUSSIN DM) 100-10 MG/5ML syrup Take 5 mLs by mouth every 4 (four) hours as needed for cough.  Marland Kitchen ipratropium (ATROVENT) 0.02 % nebulizer solution USE 1 VIAL VIA NEBULIZER EVERY 6 HOURS AS NEEDED FOR WHEEZING OR SHORTNESS OF BREATH  . losartan (COZAAR) 25 MG tablet Take 1 tablet (25 mg total) by mouth daily.  . montelukast (SINGULAIR) 10 MG tablet TAKE 1 TABLET(10 MG) BY MOUTH AT BEDTIME  . predniSONE (DELTASONE) 10 MG tablet Take 1 tablet three times a day with a meal for three for three days, take 1 tablet by twice daily with a meal for 3 days, take 1 tablet once daily with a meal for 3 days  . PROAIR HFA 108 (90 Base) MCG/ACT inhaler Inhale 2 puffs into the lungs every 6 (six) hours as needed for wheezing or shortness of breath.  . QUEtiapine (SEROQUEL) 25 MG tablet Take 1 tablet (25 mg total) by mouth at bedtime.  . roflumilast (DALIRESP) 500 MCG TABS tablet TAKE 1 TABLET(500 MCG) BY MOUTH DAILY  . tiotropium (SPIRIVA) 18 MCG inhalation capsule Place 1 capsule (18 mcg total) into inhaler and inhale daily.   No facility-administered encounter medications on file as of 12/25/2020.    Surgical History: Past Surgical History:  Procedure Laterality Date  . CATARACT EXTRACTION W/PHACO Left 12/13/2019   Procedure: CATARACT EXTRACTION PHACO AND INTRAOCULAR LENS PLACEMENT (IOC) LEFT;  Surgeon: Galen Manila, MD;  Location: [facility];  Service: Ophthalmology;  Laterality: Left;  CDE 4.90 U/S 0:36.8  .  CATARACT EXTRACTION W/PHACO Right 01/03/2020   Procedure: CATARACT EXTRACTION PHACO AND INTRAOCULAR LENS PLACEMENT (IOC) RIGHT 6.01 00:44.5;  Surgeon: Galen Manila, MD;  Location: [facility];  Service: Ophthalmology;  Laterality: Right;    Medical History: Past Medical History:  Diagnosis Date  . Anxiety   . COPD (chronic obstructive pulmonary disease) (HCC)   . HTN  (hypertension)   . Pneumonia 09/18/2017  . Tobacco abuse     Family History: Family History  Problem Relation Age of Onset  . Emphysema Brother   . Lung disease Mother   . Heart disease Father     Social History: Social History   Socioeconomic History  . Marital status: Single    Spouse name: Not on file  . Number of children: Not on file  . Years of education: Not on file  . Highest education level: Not on file  Occupational History  . Occupation: disabled  Tobacco Use  . Smoking status: Current Some Day Smoker    Years: 50.00    Types: Cigarettes  . Smokeless tobacco: Never Used  . Tobacco comment: 2 a week  Vaping Use  . Vaping Use: Never used  Substance and Sexual Activity  . Alcohol use: No  . Drug use: No  . Sexual activity: Never  Other Topics Concern  . Not on file  Social History Narrative  . Not on file   Social Determinants of Health   Financial Resource Strain: Not on file  Food Insecurity: Not on file  Transportation Needs: Not on file  Physical Activity: Not on file  Stress: Not on file  Social Connections: Not on file  Intimate Partner Violence: Not on file    Vital Signs: Blood pressure 128/78, pulse (!) 103, temperature 98.1 F (36.7 C), resp. rate 16, height 5\' 5"  (1.651 m), weight 181 lb (82.1 kg), SpO2 97 %.  Examination: General Appearance: The patient is well-developed, well-nourished, and in no distress. Skin: Gross inspection of skin unremarkable. Head: normocephalic, no gross deformities. Eyes: no gross deformities noted. ENT: ears appear grossly normal no exudates. Neck: Supple. No thyromegaly. No LAD. Respiratory: few rhonchi noted at this time. Cardiovascular: Normal S1 and S2 without murmur or rub. Extremities: No cyanosis. pulses are equal. Neurologic: Alert and oriented. No involuntary movements.  LABS: No results found for this or any previous visit (from the past 2160 hour(s)).  Radiology: No results found.  No  results found.  No results found.    Assessment and Plan: Patient Active Problem List   Diagnosis Date Noted  . Need for vaccination against Streptococcus pneumoniae using pneumococcal conjugate vaccine 7 08/18/2020  . Atherosclerosis of aorta (HCC) 06/26/2020  . Cough 08/12/2019  . Bronchitis 06/03/2019  . Obstructive chronic bronchitis without exacerbation (HCC) 06/03/2019  . Generalized anxiety disorder 06/03/2019  . Pneumonia 06/04/2018  . Elevated troponin level 01/30/2018  . Encephalopathy, metabolic 01/25/2018  . Acute on chronic respiratory failure with hypercapnia (HCC) 01/08/2018  . Anxiety 01/03/2018  . Essential hypertension 01/03/2018  . Smoker   . Acute encephalopathy   . Acute respiratory failure with hypoxemia (HCC) 04/16/2017  . COPD exacerbation (HCC)   . Palliative care by specialist   . Goals of care, counseling/discussion   . Respiratory insufficiency 01/14/2014  . Hypoxemia 01/14/2014     1. Obstructive chronic bronchitis with acute bronchitis (HCC) Ongoing tobacco use is a combination she is having an acute exacerbation counseling provided once again - azithromycin (ZITHROMAX) 250 MG tablet; z pack  as directed  Dispense: 6 tablet; Refill: 0 - predniSONE (DELTASONE) 10 MG tablet; Take 1 tablet three times a day with a meal for three for three days, take 1 tablet by twice daily with a meal for 3 days, take 1 tablet once daily with a meal for 3 days  Dispense: 18 tablet; Refill: 0  2. Nicotine dependence, cigarettes, with other nicotine-induced disorders Stop smoking counseling provided at length again.  The patient unfortunately states she is not able to completely stop from cigarette smoking.  3. Shortness of breath Due to exacerbation of her COPD we will continue with aggressive nebulizer and inhaler therapy also steroids and antibiotics given as above  General Counseling: I have discussed the findings of the evaluation and examination with Avaiyah.  I  have also discussed any further diagnostic evaluation thatmay be needed or ordered today. Keishawna verbalizes understanding of the findings of todays visit. We also reviewed her medications today and discussed drug interactions and side effects including but not limited excessive drowsiness and altered mental states. We also discussed that there is always a risk not just to her but also people around her. she has been encouraged to call the office with any questions or concerns that should arise related to todays visit.  No orders of the defined types were placed in this encounter.    Time spent: 35 minutes  I have personally obtained a history, examined the patient, evaluated laboratory and imaging results, formulated the assessment and plan and placed orders.    Yevonne Pax, MD [facility] Pulmonary and Critical Care Sleep medicine

## 2020-12-25 NOTE — Patient Instructions (Signed)
Chronic Obstructive Pulmonary Disease  Chronic obstructive pulmonary disease (COPD) is a long-term (chronic) lung problem. When you have COPD, it is hard for air to get in and out of your lungs. Usually the condition gets worse over time, and your lungs will never return to normal. There are things you can do to keep yourself as healthy as possible. What are the causes?  Smoking. This is the most common cause.  Certain genes passed from parent to child (inherited). What increases the risk?  Being exposed to secondhand smoke from cigarettes, pipes, or cigars.  Being exposed to chemicals and other irritants, such as fumes and dust in the work environment.  Having chronic lung conditions or infections. What are the signs or symptoms?  Shortness of breath, especially during physical activity.  A long-term cough with a large amount of thick mucus. Sometimes, the cough may not have any mucus (dry cough).  Wheezing.  Breathing quickly.  Skin that looks gray or blue, especially in the fingers, toes, or lips.  Feeling tired (fatigue).  Weight loss.  Chest tightness.  Having infections often.  Episodes when breathing symptoms become much worse (exacerbations). At the later stages of this disease, you may have swelling in the ankles, feet, or legs. How is this treated?  Taking medicines.  Quitting smoking, if you smoke.  Rehabilitation. This includes steps to make your body work better. It may involve a team of specialists.  Doing exercises.  Making changes to your diet.  Using oxygen.  Lung surgery.  Lung transplant.  Comfort measures (palliative care). Follow these instructions at home: Medicines  Take over-the-counter and prescription medicines only as told by your doctor.  Talk to your doctor before taking any cough or allergy medicines. You may need to avoid medicines that cause your lungs to be dry. Lifestyle  If you smoke, stop smoking. Smoking makes the  problem worse.  Do not smoke or use any products that contain nicotine or tobacco. If you need help quitting, ask your doctor.  Avoid being around things that make your breathing worse. This may include smoke, chemicals, and fumes.  Stay active, but remember to rest as well.  Learn and use tips on how to manage stress and control your breathing.  Make sure you get enough sleep. Most adults need at least 7 hours of sleep every night.  Eat healthy foods. Eat smaller meals more often. Rest before meals. Controlled breathing Learn and use tips on how to control your breathing as told by your doctor. Try:  Breathing in (inhaling) through your nose for 1 second. Then, pucker your lips and breath out (exhale) through your lips for 2 seconds.  Putting one hand on your belly (abdomen). Breathe in slowly through your nose for 1 second. Your hand on your belly should move out. Pucker your lips and breathe out slowly through your lips. Your hand on your belly should move in as you breathe out.   Controlled coughing Learn and use controlled coughing to clear mucus from your lungs. Follow these steps: 1. Lean your head a little forward. 2. Breathe in deeply. 3. Try to hold your breath for 3 seconds. 4. Keep your mouth slightly open while coughing 2 times. 5. Spit any mucus out into a tissue. 6. Rest and do the steps again 1 or 2 times as needed. General instructions  Make sure you get all the shots (vaccines) that your doctor recommends. Ask your doctor about a flu shot and a pneumonia shot.    Use oxygen therapy and pulmonary rehabilitation if told by your doctor. If you need home oxygen therapy, ask your doctor if you should buy a tool to measure your oxygen level (oximeter).  Make a COPD action plan with your doctor. This helps you to know what to do if you feel worse than usual.  Manage any other conditions you have as told by your doctor.  Avoid going outside when it is very hot, cold, or  humid.  Avoid people who have a sickness you can catch (contagious).  Keep all follow-up visits. Contact a doctor if:  You cough up more mucus than usual.  There is a change in the color or thickness of the mucus.  It is harder to breathe than usual.  Your breathing is faster than usual.  You have trouble sleeping.  You need to use your medicines more often than usual.  You have trouble doing your normal activities such as getting dressed or walking around the house. Get help right away if:  You have shortness of breath while resting.  You have shortness of breath that stops you from: ? Being able to talk. ? Doing normal activities.  Your chest hurts for longer than 5 minutes.  Your skin color is more blue than usual.  Your pulse oximeter shows that you have low oxygen for longer than 5 minutes.  You have a fever.  You feel too tired to breathe normally. These symptoms may represent a serious problem that is an emergency. Do not wait to see if the symptoms will go away. Get medical help right away. Call your local emergency services (911 in the [place] Do not drive yourself to the hospital. Summary  Chronic obstructive pulmonary disease (COPD) is a long-term lung problem.  The way your lungs work will never return to normal. Usually the condition gets worse over time. There are things you can do to keep yourself as healthy as possible.  Take over-the-counter and prescription medicines only as told by your doctor.  If you smoke, stop. Smoking makes the problem worse. This information is not intended to replace advice given to you by your health care provider. Make sure you discuss any questions you have with your health care provider. Document Revised: 07/31/2020 Document Reviewed: 07/31/2020 Elsevier Patient Education  2021 [employer]vier Inc.   

## 2020-12-29 DIAGNOSIS — J441 Chronic obstructive pulmonary disease with (acute) exacerbation: Secondary | ICD-10-CM | POA: Diagnosis not present

## 2020-12-29 DIAGNOSIS — R0602 Shortness of breath: Secondary | ICD-10-CM | POA: Diagnosis not present

## 2020-12-29 DIAGNOSIS — I10 Essential (primary) hypertension: Secondary | ICD-10-CM | POA: Diagnosis not present

## 2020-12-29 DIAGNOSIS — Z9981 Dependence on supplemental oxygen: Secondary | ICD-10-CM | POA: Diagnosis not present

## 2020-12-29 DIAGNOSIS — Z20822 Contact with and (suspected) exposure to covid-19: Secondary | ICD-10-CM | POA: Diagnosis not present

## 2020-12-29 DIAGNOSIS — E785 Hyperlipidemia, unspecified: Secondary | ICD-10-CM | POA: Diagnosis not present

## 2020-12-29 DIAGNOSIS — R062 Wheezing: Secondary | ICD-10-CM | POA: Diagnosis not present

## 2020-12-29 DIAGNOSIS — R000 Tachycardia, unspecified: Secondary | ICD-10-CM | POA: Diagnosis not present

## 2020-12-29 DIAGNOSIS — Z8673 Personal history of transient ischemic attack (TIA), and cerebral infarction without residual deficits: Secondary | ICD-10-CM | POA: Diagnosis not present

## 2020-12-29 DIAGNOSIS — R0600 Dyspnea, unspecified: Secondary | ICD-10-CM | POA: Diagnosis not present

## 2020-12-29 DIAGNOSIS — R0689 Other abnormalities of breathing: Secondary | ICD-10-CM | POA: Diagnosis not present

## 2020-12-29 DIAGNOSIS — Z8616 Personal history of COVID-19: Secondary | ICD-10-CM | POA: Diagnosis not present

## 2020-12-29 DIAGNOSIS — Z79899 Other long term (current) drug therapy: Secondary | ICD-10-CM | POA: Diagnosis not present

## 2020-12-29 DIAGNOSIS — F17210 Nicotine dependence, cigarettes, uncomplicated: Secondary | ICD-10-CM | POA: Diagnosis not present

## 2020-12-29 DIAGNOSIS — Z7982 Long term (current) use of aspirin: Secondary | ICD-10-CM | POA: Diagnosis not present

## 2020-12-29 DIAGNOSIS — Z7951 Long term (current) use of inhaled steroids: Secondary | ICD-10-CM | POA: Diagnosis not present

## 2020-12-30 DIAGNOSIS — R0600 Dyspnea, unspecified: Secondary | ICD-10-CM | POA: Diagnosis not present

## 2021-01-17 ENCOUNTER — Other Ambulatory Visit: Payer: Self-pay

## 2021-01-17 ENCOUNTER — Other Ambulatory Visit: Payer: Self-pay | Admitting: Hospice and Palliative Medicine

## 2021-01-17 ENCOUNTER — Telehealth: Payer: Self-pay

## 2021-01-17 DIAGNOSIS — J440 Chronic obstructive pulmonary disease with acute lower respiratory infection: Secondary | ICD-10-CM

## 2021-01-17 DIAGNOSIS — J209 Acute bronchitis, unspecified: Secondary | ICD-10-CM

## 2021-01-17 MED ORDER — PREDNISONE 10 MG PO TABS: ORAL_TABLET | ORAL | 0 refills | Status: AC

## 2021-01-17 MED ORDER — LEVOFLOXACIN 500 MG PO TABS: 500.0000 mg | ORAL_TABLET | Freq: Every day | ORAL | 0 refills | Status: AC

## 2021-01-17 NOTE — Telephone Encounter (Signed)
Pt called that she wheezing  And sinusitis pt had appt next week as per taylor send her for chest xray and send antibiotic

## 2021-01-22 ENCOUNTER — Encounter: Payer: Self-pay | Admitting: Internal Medicine

## 2021-01-22 ENCOUNTER — Ambulatory Visit (INDEPENDENT_AMBULATORY_CARE_PROVIDER_SITE_OTHER): Payer: Medicare Other | Admitting: Internal Medicine

## 2021-01-22 ENCOUNTER — Other Ambulatory Visit: Payer: Self-pay

## 2021-01-22 VITALS — BP 132/80 | HR 105 | Temp 98.5°F | Resp 16 | Ht 65.0 in | Wt 180.4 lb

## 2021-01-22 DIAGNOSIS — F17218 Nicotine dependence, cigarettes, with other nicotine-induced disorders: Secondary | ICD-10-CM | POA: Diagnosis not present

## 2021-01-22 DIAGNOSIS — R059 Cough, unspecified: Secondary | ICD-10-CM | POA: Diagnosis not present

## 2021-01-22 DIAGNOSIS — F411 Generalized anxiety disorder: Secondary | ICD-10-CM

## 2021-01-22 DIAGNOSIS — J440 Chronic obstructive pulmonary disease with acute lower respiratory infection: Secondary | ICD-10-CM

## 2021-01-22 MED ORDER — ALPRAZOLAM 0.25 MG PO TABS: 0.2500 mg | ORAL_TABLET | Freq: Two times a day (BID) | ORAL | 1 refills | Status: AC | PRN

## 2021-01-22 MED ORDER — ALBUTEROL SULFATE HFA 108 (90 BASE) MCG/ACT IN AERS: 2.0000 | INHALATION_SPRAY | RESPIRATORY_TRACT | 4 refills | Status: AC | PRN

## 2021-01-22 NOTE — Progress Notes (Signed)
[facility] [address] [place], [place] [address]  Pulmonary Sleep Medicine   Office Visit Note  Patient Name: Alicia Rivera DOB: [birth year] MRN [id number]  Date of Service: 01/22/2021  Complaints/HPI: COPD is doing a little better. Finished her abx and is finishing steroids unfortunately she continues to smoke.  The patient states that she has had some wheezing.  She does have occasional cough.  She states that she is smoking more for anxiety issues.  She also does take Xanax for her anxiety.  Her last Xanax was prescribed back in 2021 and she is requesting a refill now.  She denies having any fevers no chills.  She is not having any chest pain at this time.  ROS  General: (-) fever, (-) chills, (-) night sweats, (-) weakness Skin: (-) rashes, (-) itching,. Eyes: (-) visual changes, (-) redness, (-) itching. Nose and Sinuses: (-) nasal stuffiness or itchiness, (-) postnasal drip, (-) nosebleeds, (-) sinus trouble. Mouth and Throat: (-) sore throat, (-) hoarseness. Neck: (-) swollen glands, (-) enlarged thyroid, (-) neck pain. Respiratory: + cough, (-) bloody sputum, + shortness of breath, - wheezing. Cardiovascular: - ankle swelling, (-) chest pain. Lymphatic: (-) lymph node enlargement. Neurologic: (-) numbness, (-) tingling. Psychiatric: (-) anxiety, (-) depression   Current Medication: Outpatient Encounter Medications as of 01/22/2021  Medication Sig  . albuterol (PROVENTIL) (2.5 MG/3ML) 0.083% nebulizer solution Take 3 mLs (2.5 mg total) by nebulization every 6 (six) hours as needed for wheezing or shortness of breath. DX J44.9  . ALPRAZolam (XANAX) 0.25 MG tablet Take 1 tablet (0.25 mg total) by mouth 2 (two) times daily as needed for anxiety.  Marland Kitchen aspirin EC 81 MG tablet Take 81 mg by mouth daily.  . budesonide-formoterol (SYMBICORT) 160-4.5 MCG/ACT inhaler Inhale 1 puff into the lungs 2 (two) times daily.  . cetirizine (ZYRTEC) 10 MG tablet TAKE 1 TABLET(10  MG) BY MOUTH DAILY  . DULoxetine (CYMBALTA) 30 MG capsule Take 1 capsule (30 mg total) by mouth daily.  Marland Kitchen guaiFENesin-dextromethorphan (ROBITUSSIN DM) 100-10 MG/5ML syrup Take 5 mLs by mouth every 4 (four) hours as needed for cough.  Marland Kitchen ipratropium (ATROVENT) 0.02 % nebulizer solution USE 1 VIAL VIA NEBULIZER EVERY 6 HOURS AS NEEDED FOR WHEEZING OR SHORTNESS OF BREATH  . levofloxacin (LEVAQUIN) 500 MG tablet Take 1 tablet (500 mg total) by mouth daily.  Marland Kitchen losartan (COZAAR) 25 MG tablet Take 1 tablet (25 mg total) by mouth daily.  . montelukast (SINGULAIR) 10 MG tablet TAKE 1 TABLET(10 MG) BY MOUTH AT BEDTIME  . predniSONE (DELTASONE) 10 MG tablet Take 1 tablet three times a day with a meal for three for three days, take 1 tablet by twice daily with a meal for 3 days, take 1 tablet once daily with a meal for 3 days  . PROAIR HFA 108 (90 Base) MCG/ACT inhaler Inhale 2 puffs into the lungs every 6 (six) hours as needed for wheezing or shortness of breath.  . QUEtiapine (SEROQUEL) 25 MG tablet Take 1 tablet (25 mg total) by mouth at bedtime.  . roflumilast (DALIRESP) 500 MCG TABS tablet TAKE 1 TABLET(500 MCG) BY MOUTH DAILY  . tiotropium (SPIRIVA) 18 MCG inhalation capsule Place 1 capsule (18 mcg total) into inhaler and inhale daily.  . [DISCONTINUED] azithromycin (ZITHROMAX) 250 MG tablet z pack as directed (Patient not taking: Reported on 01/22/2021)   No facility-administered encounter medications on file as of 01/22/2021.    Surgical History: Past Surgical History:  Procedure Laterality Date  . CATARACT EXTRACTION W/PHACO Left 12/13/2019   Procedure: CATARACT EXTRACTION PHACO AND INTRAOCULAR LENS PLACEMENT (IOC) LEFT;  Surgeon: Galen Manila, MD;  Location: [facility];  Service: Ophthalmology;  Laterality: Left;  CDE 4.90 U/S 0:36.8  . CATARACT EXTRACTION W/PHACO Right 01/03/2020   Procedure: CATARACT EXTRACTION PHACO AND INTRAOCULAR LENS PLACEMENT (IOC) RIGHT 6.01 00:44.5;  Surgeon:  Galen Manila, MD;  Location: [facility];  Service: Ophthalmology;  Laterality: Right;    Medical History: Past Medical History:  Diagnosis Date  . Anxiety   . COPD (chronic obstructive pulmonary disease) (HCC)   . HTN (hypertension)   . Pneumonia 09/18/2017  . Tobacco abuse     Family History: Family History  Problem Relation Age of Onset  . Emphysema Brother   . Lung disease Mother   . Heart disease Father     Social History: Social History   Socioeconomic History  . Marital status: Single    Spouse name: Not on file  . Number of children: Not on file  . Years of education: Not on file  . Highest education level: Not on file  Occupational History  . Occupation: disabled  Tobacco Use  . Smoking status: Current Some Day Smoker    Years: 50.00    Types: Cigarettes  . Smokeless tobacco: Never Used  . Tobacco comment: 2 a week  Vaping Use  . Vaping Use: Never used  Substance and Sexual Activity  . Alcohol use: No  . Drug use: No  . Sexual activity: Never  Other Topics Concern  . Not on file  Social History Narrative  . Not on file   Social Determinants of Health   Financial Resource Strain: Not on file  Food Insecurity: Not on file  Transportation Needs: Not on file  Physical Activity: Not on file  Stress: Not on file  Social Connections: Not on file  Intimate Partner Violence: Not on file    Vital Signs: Blood pressure 132/80, pulse (!) 105, temperature 98.5 F (36.9 C), resp. rate 16, height 5\' 5"  (1.651 m), weight 180 lb 6.4 oz (81.8 kg), SpO2 95 %.  Examination: General Appearance: The patient is well-developed, well-nourished, and in no distress. Skin: Gross inspection of skin unremarkable. Head: normocephalic, no gross deformities. Eyes: no gross deformities noted. ENT: ears appear grossly normal no exudates. Neck: Supple. No thyromegaly. No LAD. Respiratory: few distant rhonchi noted. Cardiovascular: Normal S1 and S2 without  murmur or rub. Extremities: No cyanosis. pulses are equal. Neurologic: Alert and oriented. No involuntary movements.  LABS: No results found for this or any previous visit (from the past 2160 hour(s)).  Radiology: No results found.  No results found.  No results found.    Assessment and Plan: Patient Active Problem List   Diagnosis Date Noted  . Need for vaccination against Streptococcus pneumoniae using pneumococcal conjugate vaccine 7 08/18/2020  . Atherosclerosis of aorta (HCC) 06/26/2020  . Cough 08/12/2019  . Bronchitis 06/03/2019  . Obstructive chronic bronchitis without exacerbation (HCC) 06/03/2019  . Generalized anxiety disorder 06/03/2019  . Pneumonia 06/04/2018  . Elevated troponin level 01/30/2018  . Encephalopathy, metabolic 01/25/2018  . Acute on chronic respiratory failure with hypercapnia (HCC) 01/08/2018  . Anxiety 01/03/2018  . Essential hypertension 01/03/2018  . Smoker   . Acute encephalopathy   . Acute respiratory failure with hypoxemia (HCC) 04/16/2017  . COPD exacerbation (HCC)   . Palliative care by specialist   . Goals of care, counseling/discussion   .  Respiratory insufficiency 01/14/2014  . Hypoxemia 01/14/2014    1. Obstructive chronic bronchitis with acute bronchitis (HCC) She has severe disease once again instructed to quit smoking.  Very strongly urged for her to stop the use of cigarettes at this point.  She needs to continue using her inhalers.  I did give her a refill prescription for the ProAir.  2. Nicotine dependence, cigarettes, with other nicotine-induced disorders Smoking cessation again counseling provided at length for her.  3. Cough Secondary to the smoking as well as the COPD  4. Anxiety She is on xanax which she states that she takes infrequently and was asking for a refill  General Counseling: I have discussed the findings of the evaluation and examination with Alicia Rivera.  I have also discussed any further diagnostic  evaluation thatmay be needed or ordered today. Alicia Rivera verbalizes understanding of the findings of todays visit. We also reviewed her medications today and discussed drug interactions and side effects including but not limited excessive drowsiness and altered mental states. We also discussed that there is always a risk not just to her but also people around her. she has been encouraged to call the office with any questions or concerns that should arise related to todays visit.  No orders of the defined types were placed in this encounter.    Time spent: 44  I have personally obtained a history, examined the patient, evaluated laboratory and imaging results, formulated the assessment and plan and placed orders.    Yevonne Pax, MD [facility] Pulmonary and Critical Care Sleep medicine

## 2021-01-22 NOTE — Patient Instructions (Signed)
Chronic Obstructive Pulmonary Disease  Chronic obstructive pulmonary disease (COPD) is a long-term (chronic) lung problem. When you have COPD, it is hard for air to get in and out of your lungs. Usually the condition gets worse over time, and your lungs will never return to normal. There are things you can do to keep yourself as healthy as possible. What are the causes?  Smoking. This is the most common cause.  Certain genes passed from parent to child (inherited). What increases the risk?  Being exposed to secondhand smoke from cigarettes, pipes, or cigars.  Being exposed to chemicals and other irritants, such as fumes and dust in the work environment.  Having chronic lung conditions or infections. What are the signs or symptoms?  Shortness of breath, especially during physical activity.  A long-term cough with a large amount of thick mucus. Sometimes, the cough may not have any mucus (dry cough).  Wheezing.  Breathing quickly.  Skin that looks gray or blue, especially in the fingers, toes, or lips.  Feeling tired (fatigue).  Weight loss.  Chest tightness.  Having infections often.  Episodes when breathing symptoms become much worse (exacerbations). At the later stages of this disease, you may have swelling in the ankles, feet, or legs. How is this treated?  Taking medicines.  Quitting smoking, if you smoke.  Rehabilitation. This includes steps to make your body work better. It may involve a team of specialists.  Doing exercises.  Making changes to your diet.  Using oxygen.  Lung surgery.  Lung transplant.  Comfort measures (palliative care). Follow these instructions at home: Medicines  Take over-the-counter and prescription medicines only as told by your doctor.  Talk to your doctor before taking any cough or allergy medicines. You may need to avoid medicines that cause your lungs to be dry. Lifestyle  If you smoke, stop smoking. Smoking makes the  problem worse.  Do not smoke or use any products that contain nicotine or tobacco. If you need help quitting, ask your doctor.  Avoid being around things that make your breathing worse. This may include smoke, chemicals, and fumes.  Stay active, but remember to rest as well.  Learn and use tips on how to manage stress and control your breathing.  Make sure you get enough sleep. Most adults need at least 7 hours of sleep every night.  Eat healthy foods. Eat smaller meals more often. Rest before meals. Controlled breathing Learn and use tips on how to control your breathing as told by your doctor. Try:  Breathing in (inhaling) through your nose for 1 second. Then, pucker your lips and breath out (exhale) through your lips for 2 seconds.  Putting one hand on your belly (abdomen). Breathe in slowly through your nose for 1 second. Your hand on your belly should move out. Pucker your lips and breathe out slowly through your lips. Your hand on your belly should move in as you breathe out.   Controlled coughing Learn and use controlled coughing to clear mucus from your lungs. Follow these steps: 1. Lean your head a little forward. 2. Breathe in deeply. 3. Try to hold your breath for 3 seconds. 4. Keep your mouth slightly open while coughing 2 times. 5. Spit any mucus out into a tissue. 6. Rest and do the steps again 1 or 2 times as needed. General instructions  Make sure you get all the shots (vaccines) that your doctor recommends. Ask your doctor about a flu shot and a pneumonia shot.    Use oxygen therapy and pulmonary rehabilitation if told by your doctor. If you need home oxygen therapy, ask your doctor if you should buy a tool to measure your oxygen level (oximeter).  Make a COPD action plan with your doctor. This helps you to know what to do if you feel worse than usual.  Manage any other conditions you have as told by your doctor.  Avoid going outside when it is very hot, cold, or  humid.  Avoid people who have a sickness you can catch (contagious).  Keep all follow-up visits. Contact a doctor if:  You cough up more mucus than usual.  There is a change in the color or thickness of the mucus.  It is harder to breathe than usual.  Your breathing is faster than usual.  You have trouble sleeping.  You need to use your medicines more often than usual.  You have trouble doing your normal activities such as getting dressed or walking around the house. Get help right away if:  You have shortness of breath while resting.  You have shortness of breath that stops you from: ? Being able to talk. ? Doing normal activities.  Your chest hurts for longer than 5 minutes.  Your skin color is more blue than usual.  Your pulse oximeter shows that you have low oxygen for longer than 5 minutes.  You have a fever.  You feel too tired to breathe normally. These symptoms may represent a serious problem that is an emergency. Do not wait to see if the symptoms will go away. Get medical help right away. Call your local emergency services (911 in the [place] Do not drive yourself to the hospital. Summary  Chronic obstructive pulmonary disease (COPD) is a long-term lung problem.  The way your lungs work will never return to normal. Usually the condition gets worse over time. There are things you can do to keep yourself as healthy as possible.  Take over-the-counter and prescription medicines only as told by your doctor.  If you smoke, stop. Smoking makes the problem worse. This information is not intended to replace advice given to you by your health care provider. Make sure you discuss any questions you have with your health care provider. Document Revised: 07/31/2020 Document Reviewed: 07/31/2020 Elsevier Patient Education  2021 [employer]vier Inc.   

## 2021-02-01 ENCOUNTER — Emergency Department: Payer: Medicare Other

## 2021-02-01 ENCOUNTER — Inpatient Hospital Stay
Admission: EM | Admit: 2021-02-01 | Discharge: 2021-03-06 | DRG: 207 | Disposition: E | Payer: Medicare Other | Attending: Pulmonary Disease | Admitting: Pulmonary Disease

## 2021-02-01 ENCOUNTER — Other Ambulatory Visit: Payer: Self-pay

## 2021-02-01 DIAGNOSIS — Z882 Allergy status to sulfonamides status: Secondary | ICD-10-CM

## 2021-02-01 DIAGNOSIS — Z8673 Personal history of transient ischemic attack (TIA), and cerebral infarction without residual deficits: Secondary | ICD-10-CM

## 2021-02-01 DIAGNOSIS — Z66 Do not resuscitate: Secondary | ICD-10-CM | POA: Diagnosis not present

## 2021-02-01 DIAGNOSIS — R739 Hyperglycemia, unspecified: Secondary | ICD-10-CM | POA: Diagnosis not present

## 2021-02-01 DIAGNOSIS — R0902 Hypoxemia: Secondary | ICD-10-CM

## 2021-02-01 DIAGNOSIS — J9621 Acute and chronic respiratory failure with hypoxia: Secondary | ICD-10-CM | POA: Diagnosis present

## 2021-02-01 DIAGNOSIS — E875 Hyperkalemia: Secondary | ICD-10-CM | POA: Diagnosis not present

## 2021-02-01 DIAGNOSIS — Z9842 Cataract extraction status, left eye: Secondary | ICD-10-CM

## 2021-02-01 DIAGNOSIS — R0602 Shortness of breath: Secondary | ICD-10-CM | POA: Diagnosis not present

## 2021-02-01 DIAGNOSIS — E785 Hyperlipidemia, unspecified: Secondary | ICD-10-CM

## 2021-02-01 DIAGNOSIS — Z20822 Contact with and (suspected) exposure to covid-19: Secondary | ICD-10-CM | POA: Diagnosis not present

## 2021-02-01 DIAGNOSIS — Z7982 Long term (current) use of aspirin: Secondary | ICD-10-CM

## 2021-02-01 DIAGNOSIS — T4275XA Adverse effect of unspecified antiepileptic and sedative-hypnotic drugs, initial encounter: Secondary | ICD-10-CM | POA: Diagnosis not present

## 2021-02-01 DIAGNOSIS — R062 Wheezing: Secondary | ICD-10-CM | POA: Diagnosis not present

## 2021-02-01 DIAGNOSIS — J441 Chronic obstructive pulmonary disease with (acute) exacerbation: Secondary | ICD-10-CM | POA: Diagnosis present

## 2021-02-01 DIAGNOSIS — J9811 Atelectasis: Secondary | ICD-10-CM | POA: Diagnosis present

## 2021-02-01 DIAGNOSIS — Z7951 Long term (current) use of inhaled steroids: Secondary | ICD-10-CM

## 2021-02-01 DIAGNOSIS — F17200 Nicotine dependence, unspecified, uncomplicated: Secondary | ICD-10-CM | POA: Diagnosis present

## 2021-02-01 DIAGNOSIS — Z9841 Cataract extraction status, right eye: Secondary | ICD-10-CM

## 2021-02-01 DIAGNOSIS — Z79899 Other long term (current) drug therapy: Secondary | ICD-10-CM

## 2021-02-01 DIAGNOSIS — R0689 Other abnormalities of breathing: Secondary | ICD-10-CM | POA: Diagnosis not present

## 2021-02-01 DIAGNOSIS — G9341 Metabolic encephalopathy: Secondary | ICD-10-CM | POA: Diagnosis not present

## 2021-02-01 DIAGNOSIS — J9622 Acute and chronic respiratory failure with hypercapnia: Secondary | ICD-10-CM | POA: Diagnosis not present

## 2021-02-01 DIAGNOSIS — E873 Alkalosis: Secondary | ICD-10-CM | POA: Diagnosis present

## 2021-02-01 DIAGNOSIS — Z825 Family history of asthma and other chronic lower respiratory diseases: Secondary | ICD-10-CM

## 2021-02-01 DIAGNOSIS — I952 Hypotension due to drugs: Secondary | ICD-10-CM | POA: Diagnosis not present

## 2021-02-01 DIAGNOSIS — E860 Dehydration: Secondary | ICD-10-CM | POA: Diagnosis present

## 2021-02-01 DIAGNOSIS — J439 Emphysema, unspecified: Principal | ICD-10-CM | POA: Diagnosis present

## 2021-02-01 DIAGNOSIS — J9601 Acute respiratory failure with hypoxia: Secondary | ICD-10-CM

## 2021-02-01 DIAGNOSIS — I10 Essential (primary) hypertension: Secondary | ICD-10-CM | POA: Diagnosis present

## 2021-02-01 DIAGNOSIS — Z01818 Encounter for other preprocedural examination: Secondary | ICD-10-CM

## 2021-02-01 DIAGNOSIS — F17210 Nicotine dependence, cigarettes, uncomplicated: Secondary | ICD-10-CM | POA: Diagnosis present

## 2021-02-01 DIAGNOSIS — R000 Tachycardia, unspecified: Secondary | ICD-10-CM | POA: Diagnosis not present

## 2021-02-01 DIAGNOSIS — F411 Generalized anxiety disorder: Secondary | ICD-10-CM | POA: Diagnosis present

## 2021-02-01 DIAGNOSIS — E876 Hypokalemia: Secondary | ICD-10-CM | POA: Diagnosis present

## 2021-02-01 DIAGNOSIS — Z961 Presence of intraocular lens: Secondary | ICD-10-CM | POA: Diagnosis present

## 2021-02-01 DIAGNOSIS — F09 Unspecified mental disorder due to known physiological condition: Secondary | ICD-10-CM | POA: Diagnosis not present

## 2021-02-01 DIAGNOSIS — Z515 Encounter for palliative care: Secondary | ICD-10-CM | POA: Diagnosis not present

## 2021-02-01 DIAGNOSIS — Z888 Allergy status to other drugs, medicaments and biological substances status: Secondary | ICD-10-CM

## 2021-02-01 DIAGNOSIS — J9801 Acute bronchospasm: Secondary | ICD-10-CM | POA: Diagnosis not present

## 2021-02-01 DIAGNOSIS — D6489 Other specified anemias: Secondary | ICD-10-CM | POA: Diagnosis present

## 2021-02-01 DIAGNOSIS — R339 Retention of urine, unspecified: Secondary | ICD-10-CM | POA: Diagnosis not present

## 2021-02-01 DIAGNOSIS — T380X5A Adverse effect of glucocorticoids and synthetic analogues, initial encounter: Secondary | ICD-10-CM | POA: Diagnosis not present

## 2021-02-01 DIAGNOSIS — N170 Acute kidney failure with tubular necrosis: Secondary | ICD-10-CM | POA: Diagnosis not present

## 2021-02-01 DIAGNOSIS — Y92239 Unspecified place in hospital as the place of occurrence of the external cause: Secondary | ICD-10-CM | POA: Diagnosis not present

## 2021-02-01 DIAGNOSIS — J9620 Acute and chronic respiratory failure, unspecified whether with hypoxia or hypercapnia: Secondary | ICD-10-CM

## 2021-02-01 DIAGNOSIS — R069 Unspecified abnormalities of breathing: Secondary | ICD-10-CM | POA: Diagnosis not present

## 2021-02-01 DIAGNOSIS — F40240 Claustrophobia: Secondary | ICD-10-CM | POA: Diagnosis present

## 2021-02-01 DIAGNOSIS — Z8249 Family history of ischemic heart disease and other diseases of the circulatory system: Secondary | ICD-10-CM

## 2021-02-01 DIAGNOSIS — J189 Pneumonia, unspecified organism: Secondary | ICD-10-CM | POA: Diagnosis present

## 2021-02-01 HISTORY — DX: Hyperlipidemia, unspecified: E78.5

## 2021-02-01 HISTORY — DX: Transient cerebral ischemic attack, unspecified: G45.9

## 2021-02-01 MED ORDER — IPRATROPIUM-ALBUTEROL 0.5-2.5 (3) MG/3ML IN SOLN
3.0000 mL | Freq: Once | RESPIRATORY_TRACT | Status: AC
  Administered 2021-02-02: 3 mL via RESPIRATORY_TRACT
  Filled 2021-02-01: qty 3

## 2021-02-01 MED ORDER — METHYLPREDNISOLONE SODIUM SUCC 125 MG IJ SOLR
125.0000 mg | Freq: Once | INTRAMUSCULAR | Status: AC
  Administered 2021-02-02: 125 mg via INTRAVENOUS
  Filled 2021-02-01: qty 2

## 2021-02-01 NOTE — ED Triage Notes (Signed)
Pt has been SOB for the last few days. EMS gave a duoneb.

## 2021-02-02 ENCOUNTER — Encounter: Payer: Self-pay | Admitting: Internal Medicine

## 2021-02-02 ENCOUNTER — Inpatient Hospital Stay: Payer: Medicare Other

## 2021-02-02 DIAGNOSIS — Z66 Do not resuscitate: Secondary | ICD-10-CM | POA: Diagnosis not present

## 2021-02-02 DIAGNOSIS — J189 Pneumonia, unspecified organism: Secondary | ICD-10-CM | POA: Diagnosis present

## 2021-02-02 DIAGNOSIS — I10 Essential (primary) hypertension: Secondary | ICD-10-CM

## 2021-02-02 DIAGNOSIS — J9601 Acute respiratory failure with hypoxia: Secondary | ICD-10-CM | POA: Diagnosis not present

## 2021-02-02 DIAGNOSIS — Z825 Family history of asthma and other chronic lower respiratory diseases: Secondary | ICD-10-CM | POA: Diagnosis not present

## 2021-02-02 DIAGNOSIS — Z20822 Contact with and (suspected) exposure to covid-19: Secondary | ICD-10-CM | POA: Diagnosis present

## 2021-02-02 DIAGNOSIS — R0603 Acute respiratory distress: Secondary | ICD-10-CM | POA: Diagnosis not present

## 2021-02-02 DIAGNOSIS — J9690 Respiratory failure, unspecified, unspecified whether with hypoxia or hypercapnia: Secondary | ICD-10-CM | POA: Diagnosis not present

## 2021-02-02 DIAGNOSIS — J9622 Acute and chronic respiratory failure with hypercapnia: Secondary | ICD-10-CM | POA: Diagnosis present

## 2021-02-02 DIAGNOSIS — K3189 Other diseases of stomach and duodenum: Secondary | ICD-10-CM | POA: Diagnosis not present

## 2021-02-02 DIAGNOSIS — F17200 Nicotine dependence, unspecified, uncomplicated: Secondary | ICD-10-CM | POA: Diagnosis not present

## 2021-02-02 DIAGNOSIS — R000 Tachycardia, unspecified: Secondary | ICD-10-CM | POA: Diagnosis not present

## 2021-02-02 DIAGNOSIS — Z79899 Other long term (current) drug therapy: Secondary | ICD-10-CM | POA: Diagnosis not present

## 2021-02-02 DIAGNOSIS — Z7951 Long term (current) use of inhaled steroids: Secondary | ICD-10-CM | POA: Diagnosis not present

## 2021-02-02 DIAGNOSIS — M4186 Other forms of scoliosis, lumbar region: Secondary | ICD-10-CM | POA: Diagnosis not present

## 2021-02-02 DIAGNOSIS — Z4682 Encounter for fitting and adjustment of non-vascular catheter: Secondary | ICD-10-CM | POA: Diagnosis not present

## 2021-02-02 DIAGNOSIS — F17210 Nicotine dependence, cigarettes, uncomplicated: Secondary | ICD-10-CM | POA: Diagnosis present

## 2021-02-02 DIAGNOSIS — Z882 Allergy status to sulfonamides status: Secondary | ICD-10-CM | POA: Diagnosis not present

## 2021-02-02 DIAGNOSIS — J90 Pleural effusion, not elsewhere classified: Secondary | ICD-10-CM | POA: Diagnosis not present

## 2021-02-02 DIAGNOSIS — Z888 Allergy status to other drugs, medicaments and biological substances status: Secondary | ICD-10-CM | POA: Diagnosis not present

## 2021-02-02 DIAGNOSIS — R0602 Shortness of breath: Secondary | ICD-10-CM | POA: Diagnosis not present

## 2021-02-02 DIAGNOSIS — R0902 Hypoxemia: Secondary | ICD-10-CM | POA: Diagnosis not present

## 2021-02-02 DIAGNOSIS — N170 Acute kidney failure with tubular necrosis: Secondary | ICD-10-CM | POA: Diagnosis not present

## 2021-02-02 DIAGNOSIS — Z7982 Long term (current) use of aspirin: Secondary | ICD-10-CM | POA: Diagnosis not present

## 2021-02-02 DIAGNOSIS — J439 Emphysema, unspecified: Secondary | ICD-10-CM | POA: Diagnosis present

## 2021-02-02 DIAGNOSIS — Z8249 Family history of ischemic heart disease and other diseases of the circulatory system: Secondary | ICD-10-CM | POA: Diagnosis not present

## 2021-02-02 DIAGNOSIS — Z9842 Cataract extraction status, left eye: Secondary | ICD-10-CM | POA: Diagnosis not present

## 2021-02-02 DIAGNOSIS — F411 Generalized anxiety disorder: Secondary | ICD-10-CM | POA: Diagnosis present

## 2021-02-02 DIAGNOSIS — M47816 Spondylosis without myelopathy or radiculopathy, lumbar region: Secondary | ICD-10-CM | POA: Diagnosis not present

## 2021-02-02 DIAGNOSIS — J9811 Atelectasis: Secondary | ICD-10-CM | POA: Diagnosis present

## 2021-02-02 DIAGNOSIS — J441 Chronic obstructive pulmonary disease with (acute) exacerbation: Secondary | ICD-10-CM | POA: Diagnosis not present

## 2021-02-02 DIAGNOSIS — G9341 Metabolic encephalopathy: Secondary | ICD-10-CM | POA: Diagnosis not present

## 2021-02-02 DIAGNOSIS — Z7189 Other specified counseling: Secondary | ICD-10-CM | POA: Diagnosis not present

## 2021-02-02 DIAGNOSIS — Z515 Encounter for palliative care: Secondary | ICD-10-CM | POA: Diagnosis not present

## 2021-02-02 DIAGNOSIS — Y92239 Unspecified place in hospital as the place of occurrence of the external cause: Secondary | ICD-10-CM | POA: Diagnosis not present

## 2021-02-02 DIAGNOSIS — Z961 Presence of intraocular lens: Secondary | ICD-10-CM | POA: Diagnosis present

## 2021-02-02 DIAGNOSIS — R4182 Altered mental status, unspecified: Secondary | ICD-10-CM | POA: Diagnosis not present

## 2021-02-02 DIAGNOSIS — E873 Alkalosis: Secondary | ICD-10-CM | POA: Diagnosis present

## 2021-02-02 DIAGNOSIS — Z9841 Cataract extraction status, right eye: Secondary | ICD-10-CM | POA: Diagnosis not present

## 2021-02-02 DIAGNOSIS — J9621 Acute and chronic respiratory failure with hypoxia: Secondary | ICD-10-CM | POA: Diagnosis present

## 2021-02-02 LAB — BASIC METABOLIC PANEL
Anion gap: 10 (ref 5–15)
BUN: 8 mg/dL (ref 8–23)
CO2: 22 mmol/L (ref 22–32)
Calcium: 8.2 mg/dL — ABNORMAL LOW (ref 8.9–10.3)
Chloride: 104 mmol/L (ref 98–111)
Creatinine, Ser: 0.83 mg/dL (ref 0.44–1.00)
GFR, Estimated: >60 mL/min (ref >60)
Glucose, Bld: 98 mg/dL (ref 70–99)
Potassium: 3.2 mmol/L — ABNORMAL LOW (ref 3.5–5.1)
Sodium: 136 mmol/L (ref 135–145)

## 2021-02-02 LAB — CBC
HCT: 42.6 % (ref 36.0–46.0)
Hemoglobin: 14.6 g/dL (ref 12.0–15.0)
MCH: 32 pg (ref 26.0–34.0)
MCHC: 34.3 g/dL (ref 30.0–36.0)
MCV: 93.4 fL (ref 80.0–100.0)
Platelets: 274 10*3/uL (ref 150–400)
RBC: 4.56 MIL/uL (ref 3.87–5.11)
RDW: 13.4 % (ref 11.5–15.5)
WBC: 13.3 10*3/uL — ABNORMAL HIGH (ref 4.0–10.5)
nRBC: 0 % (ref 0.0–0.2)

## 2021-02-02 LAB — BLOOD GAS, ARTERIAL
Acid-Base Excess: 1.7 mmol/L (ref 0.0–2.0)
Acid-base deficit: 2.3 mmol/L — ABNORMAL HIGH (ref 0.0–2.0)
Allens test (pass/fail): POSITIVE — AB
Bicarbonate: 21.4 mmol/L (ref 20.0–28.0)
Bicarbonate: 24.2 mmol/L (ref 20.0–28.0)
FIO2: 0.28
FIO2: 0.32
O2 Saturation: 98.2 %
O2 Saturation: 94 %
Patient temperature: 37
Patient temperature: 37
pCO2 arterial: 33 mmHg (ref 32.0–48.0)
pCO2 arterial: 31 mmHg — ABNORMAL LOW (ref 32.0–48.0)
pH, Arterial: 7.42 (ref 7.350–7.450)
pH, Arterial: 7.5 — ABNORMAL HIGH (ref 7.350–7.450)
pO2, Arterial: 107 mmHg (ref 83.0–108.0)
pO2, Arterial: 64 mmHg — ABNORMAL LOW (ref 83.0–108.0)

## 2021-02-02 LAB — RESP PANEL BY RT-PCR (FLU A&B, COVID) ARPGX2
Influenza A by PCR: NEGATIVE
Influenza B by PCR: NEGATIVE
SARS Coronavirus 2 by RT PCR: NEGATIVE

## 2021-02-02 LAB — TROPONIN I (HIGH SENSITIVITY)
Troponin I (High Sensitivity): 8 ng/L (ref <18)
Troponin I (High Sensitivity): 13 ng/L (ref <18)

## 2021-02-02 LAB — PROCALCITONIN: Procalcitonin: <0.1 ng/mL

## 2021-02-02 LAB — MAGNESIUM: Magnesium: 4.8 mg/dL — ABNORMAL HIGH (ref 1.7–2.4)

## 2021-02-02 MED ORDER — MAGNESIUM SULFATE 2 GM/50ML IV SOLN: 2.0000 g | Freq: Once | INTRAVENOUS | Status: DC

## 2021-02-02 MED ORDER — LORATADINE 10 MG PO TABS
10.0000 mg | ORAL_TABLET | Freq: Every day | ORAL | Status: DC
  Administered 2021-02-02: 09:00:00 10 mg via ORAL
  Filled 2021-02-02: qty 1

## 2021-02-02 MED ORDER — MIDAZOLAM HCL 2 MG/2ML IJ SOLN
INTRAMUSCULAR | Status: AC
  Filled 2021-02-02: qty 4

## 2021-02-02 MED ORDER — PROPOFOL 1000 MG/100ML IV EMUL
INTRAVENOUS | Status: AC
  Filled 2021-02-02: qty 100

## 2021-02-02 MED ORDER — PANTOPRAZOLE SODIUM 40 MG PO TBEC
40.0000 mg | DELAYED_RELEASE_TABLET | Freq: Every day | ORAL | Status: DC
Start: 2021-02-02 — End: 2021-02-03
  Administered 2021-02-02: 40 mg via ORAL
  Filled 2021-02-02: qty 1

## 2021-02-02 MED ORDER — POTASSIUM CHLORIDE CRYS ER 20 MEQ PO TBCR
40.0000 meq | EXTENDED_RELEASE_TABLET | Freq: Once | ORAL | Status: AC
  Administered 2021-02-02: 40 meq via ORAL
  Filled 2021-02-02: qty 2

## 2021-02-02 MED ORDER — MIDAZOLAM HCL 2 MG/2ML IJ SOLN
1.0000 mg | INTRAMUSCULAR | Status: DC | PRN
  Administered 2021-02-03 – 2021-02-06 (×8): 1 mg via INTRAVENOUS
  Filled 2021-02-02 (×9): qty 2

## 2021-02-02 MED ORDER — SODIUM CHLORIDE 0.9 % IV SOLN
1.0000 g | Freq: Every day | INTRAVENOUS | Status: AC
  Administered 2021-02-02 – 2021-02-06 (×5): 1 g via INTRAVENOUS
  Filled 2021-02-02 (×2): qty 1
  Filled 2021-02-02: qty 10
  Filled 2021-02-02 (×3): qty 1

## 2021-02-02 MED ORDER — ALBUTEROL SULFATE (2.5 MG/3ML) 0.083% IN NEBU
10.0000 mg | INHALATION_SOLUTION | Freq: Once | RESPIRATORY_TRACT | Status: AC
  Administered 2021-02-02: 10 mg via RESPIRATORY_TRACT
  Filled 2021-02-02: qty 12

## 2021-02-02 MED ORDER — DOCUSATE SODIUM 50 MG/5ML PO LIQD
100.0000 mg | Freq: Two times a day (BID) | ORAL | Status: DC
  Administered 2021-02-03 – 2021-02-08 (×12): 100 mg
  Filled 2021-02-02 (×12): qty 10

## 2021-02-02 MED ORDER — METHYLPREDNISOLONE SODIUM SUCC 125 MG IJ SOLR
125.0000 mg | Freq: Three times a day (TID) | INTRAMUSCULAR | Status: DC
  Administered 2021-02-02 (×2): 125 mg via INTRAVENOUS
  Filled 2021-02-02 (×2): qty 2

## 2021-02-02 MED ORDER — ORAL CARE MOUTH RINSE
15.0000 mL | OROMUCOSAL | Status: DC
  Administered 2021-02-03 – 2021-02-08 (×52): 15 mL via OROMUCOSAL

## 2021-02-02 MED ORDER — LORAZEPAM 2 MG/ML IJ SOLN
0.5000 mg | Freq: Three times a day (TID) | INTRAMUSCULAR | Status: DC | PRN
  Administered 2021-02-02: 0.5 mg via INTRAVENOUS
  Filled 2021-02-02: qty 1

## 2021-02-02 MED ORDER — VECURONIUM BROMIDE 10 MG IV SOLR
INTRAVENOUS | Status: AC
  Administered 2021-02-02: 10 mg via INTRAVENOUS
  Filled 2021-02-02: qty 10

## 2021-02-02 MED ORDER — ALPRAZOLAM 0.25 MG PO TABS: 0.2500 mg | ORAL_TABLET | Freq: Three times a day (TID) | ORAL | Status: DC | PRN

## 2021-02-02 MED ORDER — IPRATROPIUM-ALBUTEROL 0.5-2.5 (3) MG/3ML IN SOLN
3.0000 mL | RESPIRATORY_TRACT | Status: DC | PRN
  Administered 2021-02-02 (×3): 3 mL via RESPIRATORY_TRACT
  Filled 2021-02-02 (×4): qty 3

## 2021-02-02 MED ORDER — ETOMIDATE 2 MG/ML IV SOLN
20.0000 mg | Freq: Once | INTRAVENOUS | Status: AC
  Administered 2021-02-02: 20 mg via INTRAVENOUS
  Filled 2021-02-02: qty 10

## 2021-02-02 MED ORDER — ENOXAPARIN SODIUM 40 MG/0.4ML IJ SOSY
40.0000 mg | PREFILLED_SYRINGE | INTRAMUSCULAR | Status: DC
  Administered 2021-02-02 – 2021-02-08 (×7): 40 mg via SUBCUTANEOUS
  Filled 2021-02-02 (×7): qty 0.4

## 2021-02-02 MED ORDER — FAMOTIDINE IN NACL 20-0.9 MG/50ML-% IV SOLN
20.0000 mg | Freq: Two times a day (BID) | INTRAVENOUS | Status: DC
  Administered 2021-02-03 – 2021-02-08 (×12): 20 mg via INTRAVENOUS
  Filled 2021-02-02 (×12): qty 50

## 2021-02-02 MED ORDER — IPRATROPIUM-ALBUTEROL 0.5-2.5 (3) MG/3ML IN SOLN
3.0000 mL | Freq: Four times a day (QID) | RESPIRATORY_TRACT | Status: DC
  Administered 2021-02-02 – 2021-02-04 (×8): 3 mL via RESPIRATORY_TRACT
  Filled 2021-02-02 (×8): qty 3

## 2021-02-02 MED ORDER — FENTANYL CITRATE (PF) 100 MCG/2ML IJ SOLN: 200.0000 ug | Freq: Once | INTRAMUSCULAR | Status: DC

## 2021-02-02 MED ORDER — PROPOFOL 1000 MG/100ML IV EMUL
5.0000 ug/kg/min | INTRAVENOUS | Status: DC
  Administered 2021-02-02: 40 ug/kg/min via INTRAVENOUS
  Administered 2021-02-03: 50 ug/kg/min via INTRAVENOUS
  Administered 2021-02-03: 40 ug/kg/min via INTRAVENOUS
  Administered 2021-02-03: 50 ug/kg/min via INTRAVENOUS
  Administered 2021-02-03: 20 ug/kg/min via INTRAVENOUS
  Administered 2021-02-04: 50 ug/kg/min via INTRAVENOUS
  Administered 2021-02-04: 45 ug/kg/min via INTRAVENOUS
  Administered 2021-02-04: 20 ug/kg/min via INTRAVENOUS
  Administered 2021-02-05: 40 ug/kg/min via INTRAVENOUS
  Administered 2021-02-05: 45 ug/kg/min via INTRAVENOUS
  Administered 2021-02-05: 50 ug/kg/min via INTRAVENOUS
  Administered 2021-02-05 – 2021-02-06 (×2): 40 ug/kg/min via INTRAVENOUS
  Administered 2021-02-06: 35 ug/kg/min via INTRAVENOUS
  Administered 2021-02-06 (×2): 40 ug/kg/min via INTRAVENOUS
  Administered 2021-02-06: 36 ug/kg/min via INTRAVENOUS
  Administered 2021-02-07: 35 ug/kg/min via INTRAVENOUS
  Filled 2021-02-02 (×21): qty 100

## 2021-02-02 MED ORDER — ONDANSETRON HCL 4 MG/2ML IJ SOLN
INTRAMUSCULAR | Status: AC
  Administered 2021-02-02: 4 mg
  Filled 2021-02-02: qty 2

## 2021-02-02 MED ORDER — PREDNISONE 20 MG PO TABS: 40.0000 mg | ORAL_TABLET | Freq: Every day | ORAL | Status: DC

## 2021-02-02 MED ORDER — MIDAZOLAM HCL 2 MG/2ML IJ SOLN: 8.0000 mg | Freq: Once | INTRAMUSCULAR | Status: AC

## 2021-02-02 MED ORDER — ALBUTEROL SULFATE (2.5 MG/3ML) 0.083% IN NEBU
2.5000 mg | INHALATION_SOLUTION | RESPIRATORY_TRACT | Status: DC | PRN
  Administered 2021-02-02: 2.5 mg via RESPIRATORY_TRACT
  Filled 2021-02-02: qty 3

## 2021-02-02 MED ORDER — IPRATROPIUM-ALBUTEROL 0.5-2.5 (3) MG/3ML IN SOLN
3.0000 mL | Freq: Once | RESPIRATORY_TRACT | Status: AC
  Administered 2021-02-02: 11:00:00 3 mL via RESPIRATORY_TRACT

## 2021-02-02 MED ORDER — LOSARTAN POTASSIUM 50 MG PO TABS
25.0000 mg | ORAL_TABLET | Freq: Every day | ORAL | Status: DC
  Administered 2021-02-02: 25 mg via ORAL
  Filled 2021-02-02: qty 1

## 2021-02-02 MED ORDER — SODIUM CHLORIDE 0.9 % IV SOLN: 250.0000 mg | Freq: Three times a day (TID) | INTRAVENOUS | Status: DC

## 2021-02-02 MED ORDER — METHYLPREDNISOLONE SODIUM SUCC 40 MG IJ SOLR
40.0000 mg | Freq: Four times a day (QID) | INTRAMUSCULAR | Status: DC
  Administered 2021-02-02: 40 mg via INTRAVENOUS
  Filled 2021-02-02: qty 1

## 2021-02-02 MED ORDER — MIDAZOLAM HCL 2 MG/2ML IJ SOLN
1.0000 mg | INTRAMUSCULAR | Status: AC | PRN
  Administered 2021-02-03 – 2021-02-04 (×3): 1 mg via INTRAVENOUS
  Filled 2021-02-02 (×3): qty 2

## 2021-02-02 MED ORDER — VECURONIUM BROMIDE 10 MG IV SOLR: 10.0000 mg | Freq: Once | INTRAVENOUS | Status: AC

## 2021-02-02 MED ORDER — ACETAMINOPHEN 325 MG PO TABS
650.0000 mg | ORAL_TABLET | Freq: Four times a day (QID) | ORAL | Status: DC | PRN
  Administered 2021-02-02 (×2): 650 mg via ORAL
  Filled 2021-02-02 (×2): qty 2

## 2021-02-02 MED ORDER — CHLORHEXIDINE GLUCONATE 0.12% ORAL RINSE (MEDLINE KIT)
15.0000 mL | Freq: Two times a day (BID) | OROMUCOSAL | Status: DC
  Administered 2021-02-03 – 2021-02-08 (×12): 15 mL via OROMUCOSAL

## 2021-02-02 MED ORDER — FENTANYL CITRATE (PF) 100 MCG/2ML IJ SOLN: 25.0000 ug | Freq: Once | INTRAMUSCULAR | Status: DC

## 2021-02-02 MED ORDER — MIDAZOLAM HCL 2 MG/2ML IJ SOLN
INTRAMUSCULAR | Status: AC
  Administered 2021-02-02: 8 mg via INTRAVENOUS
  Filled 2021-02-02: qty 4

## 2021-02-02 MED ORDER — FENTANYL 2500MCG IN NS 250ML (10MCG/ML) PREMIX INFUSION
0.0000 ug/h | INTRAVENOUS | Status: DC
  Administered 2021-02-03: 10 ug/h via INTRAVENOUS
  Administered 2021-02-03: 75 ug/h via INTRAVENOUS
  Administered 2021-02-04 – 2021-02-06 (×5): 200 ug/h via INTRAVENOUS
  Administered 2021-02-07: 100 ug/h via INTRAVENOUS
  Administered 2021-02-08: 200 ug/h via INTRAVENOUS
  Filled 2021-02-02 (×9): qty 250

## 2021-02-02 MED ORDER — POLYETHYLENE GLYCOL 3350 17 G PO PACK
17.0000 g | PACK | Freq: Every day | ORAL | Status: DC
  Administered 2021-02-03 – 2021-02-08 (×5): 17 g
  Filled 2021-02-02 (×6): qty 1

## 2021-02-02 MED ORDER — QUETIAPINE FUMARATE 25 MG PO TABS
25.0000 mg | ORAL_TABLET | Freq: Every day | ORAL | Status: DC
  Administered 2021-02-02: 21:00:00 25 mg via ORAL
  Filled 2021-02-02: qty 1

## 2021-02-02 MED ORDER — FENTANYL CITRATE (PF) 100 MCG/2ML IJ SOLN
INTRAMUSCULAR | Status: AC
  Administered 2021-02-02: 100 ug
  Filled 2021-02-02: qty 2

## 2021-02-02 MED ORDER — ALPRAZOLAM 0.25 MG PO TABS
0.2500 mg | ORAL_TABLET | Freq: Two times a day (BID) | ORAL | Status: DC | PRN
  Administered 2021-02-02: 0.25 mg via ORAL
  Filled 2021-02-02: qty 1

## 2021-02-02 MED ORDER — METHYLPREDNISOLONE SODIUM SUCC 125 MG IJ SOLR: 125.0000 mg | Freq: Three times a day (TID) | INTRAMUSCULAR | Status: DC

## 2021-02-02 MED ORDER — GUAIFENESIN ER 600 MG PO TB12
1200.0000 mg | ORAL_TABLET | Freq: Two times a day (BID) | ORAL | Status: DC
  Administered 2021-02-02 (×2): 1200 mg via ORAL
  Filled 2021-02-02 (×2): qty 2

## 2021-02-02 MED ORDER — LORAZEPAM 2 MG/ML IJ SOLN
1.0000 mg | Freq: Once | INTRAMUSCULAR | Status: AC
  Administered 2021-02-02: 1 mg via INTRAVENOUS
  Filled 2021-02-02: qty 1

## 2021-02-02 MED ORDER — FENTANYL BOLUS VIA INFUSION
25.0000 ug | INTRAVENOUS | Status: DC | PRN
Start: 2021-02-02 — End: 2021-02-08
  Administered 2021-02-03: 50 ug via INTRAVENOUS
  Administered 2021-02-05 (×2): 100 ug via INTRAVENOUS
  Administered 2021-02-06 (×2): 50 ug via INTRAVENOUS
  Filled 2021-02-02: qty 100

## 2021-02-02 NOTE — Progress Notes (Signed)
Attempted report to 2A, spoke with [facility]. Stated nurse for room 246 will call me back

## 2021-02-02 NOTE — Consult Note (Addendum)
NAME:  Alicia Rivera, MRN:  [id number], DOB:  1956-04-07, LOS: 0 ADMISSION DATE:  02-15-2021, CONSULTATION DATE: 02-15-2021 REFERRING MD: Dr. Para March, CHIEF COMPLAINT: Shortness of breath  History of Present Illness:  65 year old female with a known past medical history of COPD, anxiety & current smoker presenting to [facility] ED with several day history of wheezing and shortness of breath that became acutely worse overnight on Feb 15, 2021.  Per ED documentation she reported using her inhalers at home and denied any changes to her chronic cough, but did endorse chest tightness without chest pain.  She confirmed she is still smoking cigarettes.  ED course: Patient received multiple nebulizer treatments & IV Solu-Medrol.  However with minimal ambulation the patient becomes hypoxic with desaturations to 85% and associated tachypnea and wheezing.  Oxygenation improved with 3 L nasal cannula, [facility] hospitalist consulted for admission. Initial vitals: Afebrile at 97.9, tachypneic at 22, tachycardic at 109, BP 129/78 and SPO2 93% on 2 L nasal cannula. Significant labs: (On admission) hypokalemic at 3.2, hypermagnesemia at 4.8, procalcitonin negative, mild leukocytosis at 13.3.  Chest x-ray revealing chronic interstitial changes without active or acute cardiopulmonary disease.  Hospital course: After admission hospitalist was called bedside in the morning of 02/02/2021 due to complaints of worsening shortness of breath and assessed patient bedside.  Patient received IV Solu-Medrol, nebulizer treatments, Ativan IV with improvement of work of breathing even though diffuse wheezing still noted.  Changes to medication and BiPAP added as needed. Overnight on 02/02/2021, Dr. Para March with the [facility] hospitalist service was alerted that the patient had worsening dyspnea/labored breathing and associated anxiety.  ABG obtained revealing respiratory alkalosis and mild hypoxia: 7.50/31/64/24.2.  Patient had received multiple nebulizer  treatments since 1800, as well as Ativan IV, 125 mg of Solu-Medrol & scheduled Seroquel nightly.  Due to WOB and anxiety she was unable to tolerate BIPAP.  After discussion with Dr. Para March decision was made to transfer the patient to stepdown with PCCM consulted for additional monitoring and management Pertinent  Medical History  Current smoker COPD -not on home oxygen Anxiety Hypertension Hyperlipidemia Ischemic left thalamic stroke -10/2020 TIA  Significant Hospital Events: Including procedures, antibiotic start and stop dates in addition to other pertinent events   . Feb 15, 2021-admitted with [facility] for COPD exacerbation requiring acute oxygen . 02/02/2021- urgently transferred to ICU due to respiratory distress requiring emergent RSI and mechanical ventilation  Interim History / Subjective:  Upon arrival to ICU patient tachypneic, in tripod position dyspneic with labored breathing.  Only able to respond with one-word answers.  Minimal air movement auscultated bilaterally. Decision made to emergently intubate the patient, discussed with patient who responded by nodding her head saying " go ahead and do it, knock me out first"  Objective   Blood pressure (!) 163/102, pulse (!) 120, temperature 98.1 F (36.7 C), temperature source Oral, resp. rate (!) 32, height 5\' 4"  (1.626 m), weight 81.6 kg, SpO2 95 %.    Vent Mode: PRVC FiO2 (%):  [50 %] 50 % Set Rate:  [20 bmp] 20 bmp Vt Set:  [450 mL] 450 mL PEEP:  [5 cmH20] 5 cmH20 Plateau Pressure:  [22 cmH20] 22 cmH20   Intake/Output Summary (Last 24 hours) at 02/02/2021 2357 Last data filed at 02/02/2021 1849 Gross per 24 hour  Intake 340 ml  Output --  Net 340 ml   Filed Weights   02-15-21 2259  Weight: 81.6 kg    Examination: General: Adult female, critically ill, sitting in bed in  tripod position in respiratory distress HEENT: MM pink/moist, anicteric, atraumatic, neck supple Neuro: A&O x 4, able to follow commands, PERRL +3,  MAE CV: s1s2 RRR, sinus tachycardia on monitor, no r/m/g Pulm: Tachypneic/dyspneic/labored on 3 L nasal cannula, breath sounds severely diminished throughout GI: soft, rounded, non tender, bs x 4 Skin: Flushed, limited exam- no rashes/lesions noted Extremities: warm/dry, pulses + 2 R/P, no edema noted  Labs/imaging that I have personally reviewed  (right click and "Reselect all SmartList Selections" daily)  Net: +340 mL Na+/ K+: 136/3.2 BUN/Cr.: 8/0.83 Serum CO2/ AG: 22/10  Hgb: 14.6 Troponin: 8 > 13  WBC/ TMAX: 13.3/ 36.8 PCT: < 0.10  ABG: 7.50/31/64/24.2 CXR 02/16/2021: Chronic interstitial changes without acute or active cardiopulmonary disease Resolved Hospital Problem list     Assessment & Plan:  Acute on chronic hypoxic Respiratory Failure secondary to COPD exacerbation  Sedation associated hypotension PMHx: COPD (no home oxygen), current smoker Per chart review patient had a recent COPD exacerbation and was placed on Levaquin with a prednisone taper on 01/17/2021. - Ventilator settings: PRVC  8 mL/kg, 50% FiO2, 5 PEEP, continue ventilator support & lung protective strategies - Wean PEEP & FiO2 as tolerated, maintain SpO2 > 88% - Head of bed elevated 30 degrees, VAP protocol in place - Plateau pressures less than 30 cm H20  - Intermittent chest x-ray & ABG PRN - Daily WUA with SBT as tolerated  - Ensure adequate pulmonary hygiene  - trend PCT - Continue ceftriaxone & add azithromycin for COPD exacerbation - Steroids slightly tapered from 125 mg every 8 h to: solu-medrol 60 mg BID - Budesonide nebs BID, bronchodilators PRN - PAD protocol in place: continue Fentanyl drip & Propofol drip, Versed PRN -Patient received 1 L NS bolus, will follow with peripheral Levophed PRN to maintain MAP > 65 - Rule out for PE, D-dimer elevated > CT angio pending  Anxiety -Restart patient's home Xanax as a BID PRN to assist with anxiety -Continue Seroquel nightly -Supportive  care  Hypertension -Marginal blood pressures post intubation with sedation on board -We will hold home regimen: Losartan, consider restarting as patient stabilizes  Hyperlipidemia PMHx: Left thalamic stroke, TIA -Per chart review patient was prescribed Lipitor 80 mg and baby aspirin, unclear if she is currently taking both -No current LFT's, will follow up with liver enzymes & lipid panel with a.m. labs consider restarting Lipitor as needed.  At risk for steroid induced hyperglycemia -Every 4 CBG monitoring -SSI as needed to maintain blood sugar between 140-180 -Follow ICU hypo-/hyper-glycemia protocol  Best practice (right click and "Reselect all SmartList Selections" daily)  Diet:  NPO Pain/Anxiety/Delirium protocol (if indicated): Yes (RASS goal -1) VAP protocol (if indicated): Yes DVT prophylaxis: LMWH GI prophylaxis: H2B Glucose control:  SSI No Central venous access:  N/A Arterial line:  N/A Foley:  N/A Mobility:  bed rest  PT consulted: N/A Last date of multidisciplinary goals of care discussion 02/02/21 Code Status:  full code Disposition: ICU  Labs   CBC: Recent Labs  Lab 2021/02/16 2300  WBC 13.3*  HGB 14.6  HCT 42.6  MCV 93.4  PLT 274    Basic Metabolic Panel: Recent Labs  Lab Feb 16, 2021 2300  NA 136  K 3.2*  CL 104  CO2 22  GLUCOSE 98  BUN 8  CREATININE 0.83  CALCIUM 8.2*  MG 4.8*   GFR: Estimated Creatinine Clearance: 68 mL/min (by C-G formula based on SCr of 0.83 mg/dL). Recent Labs  Lab Feb 16, 2021 2300  PROCALCITON <0.10  WBC 13.3*    Liver Function Tests: No results for input(s): AST, ALT, ALKPHOS, BILITOT, PROT, ALBUMIN in the last 168 hours. No results for input(s): LIPASE, AMYLASE in the last 168 hours. No results for input(s): AMMONIA in the last 168 hours.  ABG    Component Value Date/Time   PHART 7.50 (H) 02/02/2021 2230   PCO2ART 31 (L) 02/02/2021 2230   PO2ART 64 (L) 02/02/2021 2230   HCO3 24.2 02/02/2021 2230    ACIDBASEDEF 2.3 (H) 02/02/2021 1003   O2SAT 94.0 02/02/2021 2230     Coagulation Profile: No results for input(s): INR, PROTIME in the last 168 hours.  Cardiac Enzymes: No results for input(s): CKTOTAL, CKMB, CKMBINDEX, TROPONINI in the last 168 hours.  HbA1C: No results found for: HGBA1C  CBG: No results for input(s): GLUCAP in the last 168 hours.  Review of Systems: Positives in BOLD  Unable to perform full ROS d/t impending respiratory failure and need for emergent intubation. PULM: Denies shortness of breath, cough, sputum production, hemoptysis, wheezing  Past Medical History:  She,  has a past medical history of Anxiety, COPD (chronic obstructive pulmonary disease) (HCC), HTN (hypertension), Pneumonia (09/18/2017), and Tobacco abuse.   Surgical History:   Past Surgical History:  Procedure Laterality Date  . CATARACT EXTRACTION W/PHACO Left 12/13/2019   Procedure: CATARACT EXTRACTION PHACO AND INTRAOCULAR LENS PLACEMENT (IOC) LEFT;  Surgeon: Galen Manila, MD;  Location: [facility];  Service: Ophthalmology;  Laterality: Left;  CDE 4.90 U/S 0:36.8  . CATARACT EXTRACTION W/PHACO Right 01/03/2020   Procedure: CATARACT EXTRACTION PHACO AND INTRAOCULAR LENS PLACEMENT (IOC) RIGHT 6.01 00:44.5;  Surgeon: Galen Manila, MD;  Location: [facility];  Service: Ophthalmology;  Laterality: Right;     Social History:   reports that she has been smoking cigarettes. She has smoked for the past 50.00 years. She has never used smokeless tobacco. She reports that she does not drink alcohol and does not use drugs.   Family History:  Her family history includes Emphysema in her brother; Heart disease in her father; Lung disease in her mother.   Allergies Allergies  Allergen Reactions  . Crestor [Rosuvastatin] Nausea And Vomiting    Stopped medication 07/15/20  . Other Hives    trilogy  . Trelegy Ellipta [Fluticasone-Umeclidin-Vilant] Anaphylaxis  . Sulfa  Antibiotics   . Sulfasalazine Hives     Home Medications  Prior to Admission medications   Medication Sig Start Date End Date Taking? Authorizing Provider  albuterol (VENTOLIN HFA) 108 (90 Base) MCG/ACT inhaler Inhale 2 puffs into the lungs every 4 (four) hours as needed for wheezing or shortness of breath. 01/22/21  Yes Yevonne Pax, MD  ALPRAZolam Prudy Feeler) 0.25 MG tablet Take 1 tablet (0.25 mg total) by mouth 2 (two) times daily as needed for anxiety. 01/22/21  Yes Yevonne Pax, MD  aspirin 81 MG chewable tablet Chew 81 mg by mouth daily. 01/09/21  Yes [provider]  atorvastatin (LIPITOR) 80 MG tablet Take 80 mg by mouth daily. 12/10/20  Yes [provider]  budesonide-formoterol (SYMBICORT) 160-4.5 MCG/ACT inhaler Inhale 1 puff into the lungs 2 (two) times daily. 11/19/20  Yes Theotis Burrow, NP  DULoxetine (CYMBALTA) 30 MG capsule Take 1 capsule (30 mg total) by mouth daily. 12/10/20  Yes Theotis Burrow, NP  ipratropium (ATROVENT) 0.02 % nebulizer solution USE 1 VIAL VIA NEBULIZER EVERY 6 HOURS AS NEEDED FOR WHEEZING OR SHORTNESS OF BREATH Patient taking differently: Take 0.5  mg by nebulization every 6 (six) hours as needed for wheezing or shortness of breath. 06/28/20  Yes Lyndon CodeKhan, Fozia M, MD  losartan-hydrochlorothiazide (HYZAAR) 100-12.5 MG tablet Take 1 tablet by mouth daily.   Yes [provider]  montelukast (SINGULAIR) 10 MG tablet TAKE 1 TABLET(10 MG) BY MOUTH AT BEDTIME Patient taking differently: Take 10 mg by mouth daily. 10/30/20  Yes Theotis BurrowHarris, Taylor S, NP  QUEtiapine (SEROQUEL) 25 MG tablet Take 1 tablet (25 mg total) by mouth at bedtime. 11/19/20  Yes Theotis BurrowHarris, Taylor S, NP  roflumilast (DALIRESP) 500 MCG TABS tablet TAKE 1 TABLET(500 MCG) BY MOUTH DAILY Patient taking differently: Take 500 mcg by mouth daily. 10/30/20  Yes Theotis BurrowHarris, Taylor S, NP  tiotropium (SPIRIVA) 18 MCG inhalation capsule Place 1 capsule (18 mcg total) into inhaler and inhale daily.  11/19/20  Yes Theotis BurrowHarris, Taylor S, NP  cetirizine (ZYRTEC) 10 MG tablet TAKE 1 TABLET(10 MG) BY MOUTH DAILY Patient not taking: No sig reported 12/05/19   Johnna AcostaScarboro, Adam J, NP  levofloxacin (LEVAQUIN) 500 MG tablet Take 1 tablet (500 mg total) by mouth daily. Patient not taking: No sig reported 01/17/21   Theotis BurrowHarris, Taylor S, NP  losartan (COZAAR) 25 MG tablet Take 1 tablet (25 mg total) by mouth daily. Patient not taking: No sig reported 11/19/20   Theotis BurrowHarris, Taylor S, NP  predniSONE (DELTASONE) 10 MG tablet Take 1 tablet three times a day with a meal for three for three days, take 1 tablet by twice daily with a meal for 3 days, take 1 tablet once daily with a meal for 3 days Patient not taking: No sig reported 01/17/21   Theotis BurrowHarris, Taylor S, NP     Critical care time: 55 minutes       Cheryll CockayneBritton Lee Rust-Chester, AGACNP-BC Acute Care Nurse Practitioner [facility]   [phone] / [phone] Please see Amion for pager details.

## 2021-02-02 NOTE — ED Provider Notes (Signed)
[facility] Emergency Department Provider Note  ____________________________________________  Time seen: Approximately 3:56 AM  I have reviewed the triage vital signs and the nursing notes.   HISTORY  Chief Complaint Shortness of Breath   HPI Alicia Rivera is a 65 y.o. female a history of COPD, smoking, hypertension who presents for evaluation of shortness of breath.   Patient reports few days of wheezing, progressively worsening shortness of breath which became more severe this evening.  She has been using her inhalers at home.  She denies any changes on her chronic cough.  She is still smoking.  She denies fever or chills.  She is complaining of chest tightness but no pleuritic chest pain, no personal or family history of PE or DVT, no recent travel immobilization, no leg pain or swelling, no hemoptysis or exogenous hormones.  Past Medical History:  Diagnosis Date  . Anxiety   . COPD (chronic obstructive pulmonary disease) (HCC)   . HTN (hypertension)   . Pneumonia 09/18/2017  . Tobacco abuse     Patient Active Problem List   Diagnosis Date Noted  . Need for vaccination against Streptococcus pneumoniae using pneumococcal conjugate vaccine 7 08/18/2020  . Atherosclerosis of aorta (HCC) 06/26/2020  . Cough 08/12/2019  . Bronchitis 06/03/2019  . Obstructive chronic bronchitis without exacerbation (HCC) 06/03/2019  . Generalized anxiety disorder 06/03/2019  . Pneumonia 06/04/2018  . Elevated troponin level 01/30/2018  . Encephalopathy, metabolic 01/25/2018  . Acute on chronic respiratory failure with hypercapnia (HCC) 01/08/2018  . Anxiety 01/03/2018  . Essential hypertension 01/03/2018  . Smoker   . Acute encephalopathy   . Acute respiratory failure with hypoxemia (HCC) 04/16/2017  . COPD exacerbation (HCC)   . Palliative care by specialist   . Goals of care, counseling/discussion   . Respiratory insufficiency 01/14/2014  . Hypoxemia  01/14/2014    Past Surgical History:  Procedure Laterality Date  . CATARACT EXTRACTION W/PHACO Left 12/13/2019   Procedure: CATARACT EXTRACTION PHACO AND INTRAOCULAR LENS PLACEMENT (IOC) LEFT;  Surgeon: Galen Manila, MD;  Location: [facility];  Service: Ophthalmology;  Laterality: Left;  CDE 4.90 U/S 0:36.8  . CATARACT EXTRACTION W/PHACO Right 01/03/2020   Procedure: CATARACT EXTRACTION PHACO AND INTRAOCULAR LENS PLACEMENT (IOC) RIGHT 6.01 00:44.5;  Surgeon: Galen Manila, MD;  Location: [facility];  Service: Ophthalmology;  Laterality: Right;    Prior to Admission medications   Medication Sig Start Date End Date Taking? Authorizing Provider  albuterol (VENTOLIN HFA) 108 (90 Base) MCG/ACT inhaler Inhale 2 puffs into the lungs every 4 (four) hours as needed for wheezing or shortness of breath. 01/22/21   Yevonne Pax, MD  ALPRAZolam Prudy Feeler) 0.25 MG tablet Take 1 tablet (0.25 mg total) by mouth 2 (two) times daily as needed for anxiety. 01/22/21   Yevonne Pax, MD  aspirin EC 81 MG tablet Take 81 mg by mouth daily.    [provider]  budesonide-formoterol (SYMBICORT) 160-4.5 MCG/ACT inhaler Inhale 1 puff into the lungs 2 (two) times daily. 11/19/20   Theotis Burrow, NP  cetirizine (ZYRTEC) 10 MG tablet TAKE 1 TABLET(10 MG) BY MOUTH DAILY 12/05/19   Johnna Acosta, NP  DULoxetine (CYMBALTA) 30 MG capsule Take 1 capsule (30 mg total) by mouth daily. 12/10/20   Theotis Burrow, NP  guaiFENesin-dextromethorphan (ROBITUSSIN DM) 100-10 MG/5ML syrup Take 5 mLs by mouth every 4 (four) hours as needed for cough. 11/22/19   Johnna Acosta, NP  ipratropium (ATROVENT)  0.02 % nebulizer solution USE 1 VIAL VIA NEBULIZER EVERY 6 HOURS AS NEEDED FOR WHEEZING OR SHORTNESS OF BREATH 06/28/20   Lyndon Code, MD  levofloxacin (LEVAQUIN) 500 MG tablet Take 1 tablet (500 mg total) by mouth daily. 01/17/21   Theotis Burrow, NP  losartan (COZAAR) 25 MG tablet Take 1 tablet (25 mg  total) by mouth daily. 11/19/20   Theotis Burrow, NP  montelukast (SINGULAIR) 10 MG tablet TAKE 1 TABLET(10 MG) BY MOUTH AT BEDTIME 10/30/20   Theotis Burrow, NP  predniSONE (DELTASONE) 10 MG tablet Take 1 tablet three times a day with a meal for three for three days, take 1 tablet by twice daily with a meal for 3 days, take 1 tablet once daily with a meal for 3 days 01/17/21   Theotis Burrow, NP  QUEtiapine (SEROQUEL) 25 MG tablet Take 1 tablet (25 mg total) by mouth at bedtime. 11/19/20   Theotis Burrow, NP  roflumilast (DALIRESP) 500 MCG TABS tablet TAKE 1 TABLET(500 MCG) BY MOUTH DAILY 10/30/20   Theotis Burrow, NP  tiotropium (SPIRIVA) 18 MCG inhalation capsule Place 1 capsule (18 mcg total) into inhaler and inhale daily. 11/19/20   Theotis Burrow, NP    Allergies Crestor [rosuvastatin], Other, Trelegy ellipta [fluticasone-umeclidin-vilant], Sulfa antibiotics, and Sulfasalazine  Family History  Problem Relation Age of Onset  . Emphysema Brother   . Lung disease Mother   . Heart disease Father     Social History Social History   Tobacco Use  . Smoking status: Current Some Day Smoker    Years: 50.00    Types: Cigarettes  . Smokeless tobacco: Never Used  . Tobacco comment: 2 a week  Vaping Use  . Vaping Use: Never used  Substance Use Topics  . Alcohol use: No  . Drug use: No    Review of Systems  Constitutional: Negative for fever. Eyes: Negative for visual changes. ENT: Negative for sore throat. Neck: No neck pain  Cardiovascular: Negative for chest pain. + Chest tightness Respiratory: + shortness of breath and wheezing Gastrointestinal: Negative for abdominal pain, vomiting or diarrhea. Genitourinary: Negative for dysuria. Musculoskeletal: Negative for back pain. Skin: Negative for rash. Neurological: Negative for headaches, weakness or numbness. Psych: No SI or HI  ____________________________________________   PHYSICAL EXAM:  VITAL SIGNS: Vitals:    02/02/21 0300 02/02/21 0330  BP: (!) 111/59 129/79  Pulse: 89 92  Resp: (!) 24 18  Temp:    SpO2: 93% 92%    Constitutional: Alert and oriented, mild respiratory distress.  HEENT:      Head: Normocephalic and atraumatic.         Eyes: Conjunctivae are normal. Sclera is non-icteric.       Mouth/Throat: Mucous membranes are moist.       Neck: Supple with no signs of meningismus. Cardiovascular: Regular rate and rhythm. No murmurs, gallops, or rubs. 2+ symmetrical distal pulses are present in all extremities. No JVD. Respiratory: Increased work of breathing, tachypneic, and hypoxic on room air requiring 3 L of oxygen, severely diminished air movement bilaterally with no wheezing or crackle  gastrointestinal: Soft, non tender. Musculoskeletal:  No edema, cyanosis, or erythema of extremities. Neurologic: Normal speech and language. Face is symmetric. Moving all extremities. No gross focal neurologic deficits are appreciated. Skin: Skin is warm, dry and intact. No rash noted. Psychiatric: Mood and affect are normal. Speech and behavior are normal.  ____________________________________________   LABS (all labs ordered are  listed, but only abnormal results are displayed)  Labs Reviewed  CBC - Abnormal; Notable for the following components:      Result Value   WBC 13.3 (*)    All other components within normal limits  BASIC METABOLIC PANEL - Abnormal; Notable for the following components:   Potassium 3.2 (*)    Calcium 8.2 (*)    All other components within normal limits  RESP PANEL BY RT-PCR (FLU A&B, COVID) [id number]  PROCALCITONIN  TROPONIN I (HIGH SENSITIVITY)  TROPONIN I (HIGH SENSITIVITY)   ____________________________________________  EKG  ED ECG REPORT I, Nita Sicklearolina Rise Traeger, the attending physician, personally viewed and interpreted this ECG.  Sinus tachycardia, rate of 110, normal intervals, right axis deviation, anterior Q waves and no ST elevations or depressions.   Unchanged from prior from 2019 ____________________________________________  RADIOLOGY  I have personally reviewed the images performed during this visit and I agree with the Radiologist's read.   Interpretation by Radiologist:  DG Chest Portable 1 View  Result Date: 02/02/2021 CLINICAL DATA:  Shortness of breath. EXAM: PORTABLE CHEST 1 VIEW COMPARISON:  January 17, 2020 FINDINGS: Chronic appearing increased lung markings are seen. Very mild atelectasis is noted within the bilateral lung bases. There is no evidence of acute infiltrate, pleural effusion or pneumothorax. The heart size and mediastinal contours are within normal limits. There is tortuosity of the descending thoracic aorta. Degenerative changes seen throughout the thoracic spine. IMPRESSION: Chronic interstitial changes without acute or active cardiopulmonary disease. Electronically Signed   By: Aram Candelahaddeus  Houston M.D.   On: 02/02/2021 01:02     ____________________________________________   PROCEDURES  Procedure(s) performed:yes .1-3 Lead EKG Interpretation Performed by: Nita SickleVeronese, [place], MD Authorized by: Nita SickleVeronese, [place], MD     Interpretation: abnormal     ECG rate assessment: tachycardic     Rhythm: sinus rhythm     Ectopy: none     Conduction: normal     Critical Care performed: yes  CRITICAL CARE Performed by: Nita Sicklearolina Oris Staffieri  ?  Total critical care time: 35 min  Critical care time was exclusive of separately billable procedures and treating other patients.  Critical care was necessary to treat or prevent imminent or life-threatening deterioration.  Critical care was time spent personally by me on the following activities: development of treatment plan with patient and/or surrogate as well as nursing, discussions with consultants, evaluation of patient's response to treatment, examination of patient, obtaining history from patient or surrogate, ordering and performing treatments and interventions,  ordering and review of laboratory studies, ordering and review of radiographic studies, pulse oximetry and re-evaluation of patient's condition.  ____________________________________________   INITIAL IMPRESSION / ASSESSMENT AND PLAN / ED COURSE  65 y.o. female a history of COPD, smoking, hypertension who presents for evaluation of shortness of breath, wheezing and chest tightness progressively worse over the last several days.  Patient arrives in mild respiratory distress, tachypneic and hypoxic requiring 2 to 3 L of oxygen, severely diminished air movement bilaterally with no wheezing or crackles.  DDX COPD exacerbation versus bronchitis versus pneumonia versus pneumothorax versus pulmonary edema versus PE versus pericardial effusion   EKG unchanged from baseline.  2 high-sensitivity troponins show no signs of ischemia.  Chest x-ray visualized by me with no signs of acute pneumonia, edema, or pneumothorax.  Patient with elevated white count of 13.3 but afebrile with a negative procalcitonin.  Therefore antibiotics were held.  No significant electrolyte derangements.  COVID and flu negative.  Patient received 3 DuoNeb's and  Solu-Medrol.  Patient continues to desat to 85% with minimal ambulation, patient becomes tachypneic and has wheezing.  Sats improved on 3 L nasal cannula.  Will start albuterol 10 mg/h.  Will admit to the hospitalist service for COPD exacerbation.  Old medical records reviewed.  Patient placed on telemetry for close monitoring of cardiorespiratory status.       _____________________________________________ Please note:  Patient was evaluated in Emergency Department today for the symptoms described in the history of present illness. Patient was evaluated in the context of the global COVID-19 pandemic, which necessitated consideration that the patient might be at risk for infection with the SARS-CoV-2 virus that causes COVID-19. Institutional protocols and algorithms that  pertain to the evaluation of patients at risk for COVID-19 are in a state of rapid change based on information released by regulatory bodies including the CDC and federal and state organizations. These policies and algorithms were followed during the patient's care in the ED.  Some ED evaluations and interventions may be delayed as a result of limited staffing during the pandemic.   [place] Controlled Substance Database was reviewed by me. ____________________________________________   FINAL CLINICAL IMPRESSION(S) / ED DIAGNOSES   Final diagnoses:  COPD exacerbation (HCC)  Acute respiratory failure with hypoxia (HCC)      NEW MEDICATIONS STARTED DURING THIS VISIT:  ED Discharge Orders    None       Note:  This document was prepared using Dragon voice recognition software and may include unintentional dictation errors.    Don Perking, [place], MD 02/02/21 [id number]

## 2021-02-02 NOTE — Progress Notes (Addendum)
PROGRESS NOTE    COTY STUDENT  ZYS:[id number] DOB: 1956-02-16 DOA: 01/13/2021 PCP: Lyndon Code, MD    Chief Complaint  Patient presents with  . Shortness of Breath    Brief Narrative:  Patient 65 year old female with history of hypertension, COPD, anxiety admitted this morning with several day history of diffuse wheezing, not responding to bronchodilator treatment with associated chest tightness, chronic cough with no fever or chills.  Patient admitted for acute COPD exacerbation.   Assessment & Plan:   Principal Problem:   COPD exacerbation (HCC) Active Problems:   Acute respiratory failure with hypoxemia (HCC)   Generalized anxiety disorder   Essential hypertension   #1 acute respiratory distress secondary to acute COPD exacerbation -Was called by RN that patient with complaints of worsening shortness of breath.  Went to assess patient.  Patient with use of accessory muscles of respiration. -Check ABG. -Solu-Medrol 125 mg x 1 and then every 8 hours. -DuoNebs x1 now. -Patient with complaints of claustrophobia and as such we will give a dose of 1 mg IV Ativan x1 and placed on the BiPAP. -Placed on IV Rocephin, PPI, Claritin, Mucinex. -Transfer to stepdown unit for closer monitoring.  2.  Generalized anxiety disorder -Placed on Ativan 1 mg IV x1. -Increase home regimen Xanax to 3 times daily as needed.  3.  Hypertension -Controlled on current regimen of Cozaar.  Addendum: 10:38 AM -Patient given a dose of IV Solu-Medrol, breathing treatments, Ativan 1 mg IV x1 with some improvement.  Still with some diffuse wheezing however improvement with use of accessory muscles of respiration and respiratory status. -We will discontinue Xanax and placed on Ativan 0.5 mg IV every 8 hours as needed. -DuoNebs every 2 hours as needed. -BiPAP as needed. -DC transfer order to stepdown unit.   DVT prophylaxis: Lovenox Code Status: Full Family Communication: Updated patient.  No  family at bedside. Disposition:   Status is: Observation    Dispo: The patient is from: Home              Anticipated d/c is to: Home              Patient currently in acute respiratory distress, COPD exacerbation, requiring BiPAP.  Transfer to stepdown unit, not stable for discharge.   Difficult to place patient undetermined       Consultants:   None  Procedures:   Chest x-ray 01/31/2021  Antimicrobials:   IV Rocephin 02/02/2021>>>>   Subjective: Patient sitting up in bed, using accessory muscles of respiration.  Stating he cannot get enough air.  Complaining of significant shortness of breath.  Also complained of claustrophobia.  Somewhat anxious looking.  Objective: Vitals:   02/02/21 0544 02/02/21 0808 02/02/21 0925 02/02/21 0948  BP: (!) 142/82 120/67    Pulse: 93 91    Resp:  15    Temp: 97.7 F (36.5 C) 97.6 F (36.4 C)    TempSrc:  Oral    SpO2: 96% 98% 94% 99%  Weight:      Height:       No intake or output data in the 24 hours ending 02/02/21 1007 Filed Weights   01/06/2021 2259  Weight: 81.6 kg    Examination:  General exam: In acute respiratory distress Respiratory system: In acute respiratory distress, using accessory muscles of respiration, diffuse wheezing, no crackles, no rhonchi.  Poor to fair air movement.  Speaking in choppy sentences. Cardiovascular system: Tachycardia.  No murmurs rubs or gallops.  No  JVD.  No lower extremity edema. Gastrointestinal system: Abdomen is nondistended, soft and nontender. No organomegaly or masses felt. Normal bowel sounds heard. Central nervous system: Alert and oriented. No focal neurological deficits. Extremities: Symmetric 5 x 5 power. Skin: No rashes, lesions or ulcers Psychiatry: Judgement and insight appear normal. Mood & affect appropriate.     Data Reviewed: I have personally reviewed following labs and imaging studies  CBC: Recent Labs  Lab 01/24/2021 2300  WBC 13.3*  HGB 14.6  HCT 42.6   MCV 93.4  PLT 274    Basic Metabolic Panel: Recent Labs  Lab 01/06/2021 2300  NA 136  K 3.2*  CL 104  CO2 22  GLUCOSE 98  BUN 8  CREATININE 0.83  CALCIUM 8.2*  MG 4.8*    GFR: Estimated Creatinine Clearance: 68 mL/min (by C-G formula based on SCr of 0.83 mg/dL).  Liver Function Tests: No results for input(s): AST, ALT, ALKPHOS, BILITOT, PROT, ALBUMIN in the last 168 hours.  CBG: No results for input(s): GLUCAP in the last 168 hours.   Recent Results (from the past 240 hour(s))  Resp Panel by RT-PCR (Flu A&B, Covid) Nasopharyngeal Swab     Status: None   Collection Time: 02/02/21  1:55 AM   Specimen: Nasopharyngeal Swab; Nasopharyngeal(NP) swabs in vial transport medium  Result Value Ref Range Status   SARS Coronavirus 2 by RT PCR NEGATIVE NEGATIVE Final    Comment: (NOTE) SARS-CoV-2 target nucleic acids are NOT DETECTED.  The SARS-CoV-2 RNA is generally detectable in upper respiratory specimens during the acute phase of infection. The lowest concentration of SARS-CoV-2 viral copies this assay can detect is 138 copies/mL. A negative result does not preclude SARS-Cov-2 infection and should not be used as the sole basis for treatment or other patient management decisions. A negative result may occur with  improper specimen collection/handling, submission of specimen other than nasopharyngeal swab, presence of viral mutation(s) within the areas targeted by this assay, and inadequate number of viral copies(<138 copies/mL). A negative result must be combined with clinical observations, patient history, and epidemiological information. The expected result is Negative.  Fact Sheet for Patients:  BloggerCourse.com  Fact Sheet for Healthcare Providers:  SeriousBroker.it  This test is no t yet approved or cleared by the [place] FDA and  has been authorized for detection and/or diagnosis of SARS-CoV-2 by FDA under  an Emergency Use Authorization (EUA). This EUA will remain  in effect (meaning this test can be used) for the duration of the COVID-19 declaration under Section 564(b)(1) of the Act, 21 U.S.C.section 360bbb-3(b)(1), unless the authorization is terminated  or revoked sooner.       Influenza A by PCR NEGATIVE NEGATIVE Final   Influenza B by PCR NEGATIVE NEGATIVE Final    Comment: (NOTE) The Xpert Xpress SARS-CoV-2/FLU/RSV plus assay is intended as an aid in the diagnosis of influenza from Nasopharyngeal swab specimens and should not be used as a sole basis for treatment. Nasal washings and aspirates are unacceptable for Xpert Xpress SARS-CoV-2/FLU/RSV testing.  Fact Sheet for Patients: BloggerCourse.com  Fact Sheet for Healthcare Providers: SeriousBroker.it  This test is not yet approved or cleared by the [place] FDA and has been authorized for detection and/or diagnosis of SARS-CoV-2 by FDA under an Emergency Use Authorization (EUA). This EUA will remain in effect (meaning this test can be used) for the duration of the COVID-19 declaration under Section 564(b)(1) of the Act, 21 U.S.C. section 360bbb-3(b)(1), unless the authorization is terminated  or revoked.  Performed at [facility], [address], [place], [place] [address]          Radiology Studies: DG Chest Portable 1 View  Result Date: 02/02/2021 CLINICAL DATA:  Shortness of breath. EXAM: PORTABLE CHEST 1 VIEW COMPARISON:  January 17, 2020 FINDINGS: Chronic appearing increased lung markings are seen. Very mild atelectasis is noted within the bilateral lung bases. There is no evidence of acute infiltrate, pleural effusion or pneumothorax. The heart size and mediastinal contours are within normal limits. There is tortuosity of the descending thoracic aorta. Degenerative changes seen throughout the thoracic spine. IMPRESSION: Chronic interstitial changes  without acute or active cardiopulmonary disease. Electronically Signed   By: Aram Candela M.D.   On: 02/02/2021 01:02        Scheduled Meds: . enoxaparin (LOVENOX) injection  40 mg Subcutaneous Q24H  . guaiFENesin  1,200 mg Oral BID  . ipratropium-albuterol  3 mL Nebulization Q6H  . ipratropium-albuterol  3 mL Nebulization Once  . loratadine  10 mg Oral Daily  . LORazepam  1 mg Intravenous Once  . losartan  25 mg Oral Daily  . pantoprazole  40 mg Oral QAC breakfast   Continuous Infusions: . cefTRIAXone (ROCEPHIN)  IV 1 g (02/02/21 0918)  . magnesium sulfate bolus IVPB    . methylPREDNISolone (SOLU-MEDROL) injection       LOS: 0 days    Time spent: 50 minutes    Ramiro Harvest, MD [facility]   To contact the attending provider between 7A-7P or the covering provider during after hours 7P-7A, please log into the web site [email or web address] and access using universal [place] password for that web site. If you do not have the password, please call the hospital operator.  02/02/2021, 10:07 AM

## 2021-02-02 NOTE — Progress Notes (Signed)
PT Cancellation Note  Patient Details Name: Alicia Rivera MRN: [id number] DOB: 08/20/1956   Cancelled Treatment:    Reason Eval/Treat Not Completed: Other (comment);Patient not medically ready PT orders received, chart reviewed. Per MD note, pt with increased use of accessory muscles for breathing & with increased anxiety; MD recommends transfer to stepdown for closer monitoring. Per protocol, will need new orders or continue on transfer orders due to pt transitioning to higher level of care. Will complete current orders & await new PT orders as appropriate.  Aleda Grana, PT, DPT 02/02/21, 10:39 AM   Sandi Mariscal 02/02/2021, 10:37 AM

## 2021-02-02 NOTE — Plan of Care (Signed)

## 2021-02-02 NOTE — H&P (Signed)
History and Physical    Alicia Rivera:[id number] DOB: 01-Sep-1956 DOA: 01/18/2021  PCP: Lyndon Code, MD   Patient coming from: Home  I have personally briefly reviewed patient's old medical records in [facility]  Chief Complaint: Shortness of breath  HPI: Alicia Rivera is a 65 y.o. female with medical history significant for HTN, COPD and anxiety presents with several day history of wheezing that became acutely worse on the night of arrival, not adequately responding to home bronchodilator treatment.  She has associated chest tightness without nausea vomiting or diaphoresis.  She has a chronic cough which is her baseline.  She denies fever or chills.  Denies leg pain or swelling.   ED Course: On arrival, tachypneic at 24 with O2 sat 92% on room air BP 111/99 pulse 89 afebrile.  Blood work unremarkable except for mild leukocytosis of 18,000 and mild hypokalemia of 3.2.  Procalcitonin less than 0.1 and troponin of 13.  COVID and flu negative EKG as reviewed by me : Sinus tachycardia at 110 with no acute ST-T wave changes Imaging: Chronic interstitial changes without any acute or active cardiopulmonary disease  Patient treated with multiple nebulizers, IV Solu-Medrol in the emergency room however on ambulating patient dropped to 85% on room air.  She was placed on 3 L.  Hospitalist consulted for admission  Review of Systems: As per HPI otherwise all other systems on review of systems negative.    Past Medical History:  Diagnosis Date  . Anxiety   . COPD (chronic obstructive pulmonary disease) (HCC)   . HTN (hypertension)   . Pneumonia 09/18/2017  . Tobacco abuse     Past Surgical History:  Procedure Laterality Date  . CATARACT EXTRACTION W/PHACO Left 12/13/2019   Procedure: CATARACT EXTRACTION PHACO AND INTRAOCULAR LENS PLACEMENT (IOC) LEFT;  Surgeon: Galen Manila, MD;  Location: [facility];  Service: Ophthalmology;  Laterality: Left;  CDE 4.90 U/S 0:36.8  .  CATARACT EXTRACTION W/PHACO Right 01/03/2020   Procedure: CATARACT EXTRACTION PHACO AND INTRAOCULAR LENS PLACEMENT (IOC) RIGHT 6.01 00:44.5;  Surgeon: Galen Manila, MD;  Location[facility];  Service: Ophthalmology;  Laterality: Right;     reports that she has been smoking cigarettes. She has smoked for the past 50.00 years. She has never used smokeless tobacco. She reports that she does not drink alcohol and does not use drugs.  Allergies  Allergen Reactions  . Crestor [Rosuvastatin] Nausea And Vomiting    Stopped medication 07/15/20  . Other Hives    trilogy  . Trelegy Ellipta [Fluticasone-Umeclidin-Vilant] Anaphylaxis  . Sulfa Antibiotics   . Sulfasalazine Hives    Family History  Problem Relation Age of Onset  . Emphysema Brother   . Lung disease Mother   . Heart disease Father       Prior to Admission medications   Medication Sig Start Date End Date Taking? Authorizing Provider  albuterol (VENTOLIN HFA) 108 (90 Base) MCG/ACT inhaler Inhale 2 puffs into the lungs every 4 (four) hours as needed for wheezing or shortness of breath. 01/22/21   Yevonne Pax, MD  ALPRAZolam Prudy Feeler) 0.25 MG tablet Take 1 tablet (0.25 mg total) by mouth 2 (two) times daily as needed for anxiety. 01/22/21   Yevonne Pax, MD  aspirin EC 81 MG tablet Take 81 mg by mouth daily.    [provider]  budesonide-formoterol (SYMBICORT) 160-4.5 MCG/ACT inhaler Inhale 1 puff into the lungs 2 (two) times daily. 11/19/20  Theotis Burrow, NP  cetirizine (ZYRTEC) 10 MG tablet TAKE 1 TABLET(10 MG) BY MOUTH DAILY 12/05/19   Johnna Acosta, NP  DULoxetine (CYMBALTA) 30 MG capsule Take 1 capsule (30 mg total) by mouth daily. 12/10/20   Theotis Burrow, NP  guaiFENesin-dextromethorphan (ROBITUSSIN DM) 100-10 MG/5ML syrup Take 5 mLs by mouth every 4 (four) hours as needed for cough. 11/22/19   Johnna Acosta, NP  ipratropium (ATROVENT) 0.02 % nebulizer solution USE 1 VIAL VIA NEBULIZER EVERY 6  HOURS AS NEEDED FOR WHEEZING OR SHORTNESS OF BREATH 06/28/20   Lyndon Code, MD  levofloxacin (LEVAQUIN) 500 MG tablet Take 1 tablet (500 mg total) by mouth daily. 01/17/21   Theotis Burrow, NP  losartan (COZAAR) 25 MG tablet Take 1 tablet (25 mg total) by mouth daily. 11/19/20   Theotis Burrow, NP  montelukast (SINGULAIR) 10 MG tablet TAKE 1 TABLET(10 MG) BY MOUTH AT BEDTIME 10/30/20   Theotis Burrow, NP  predniSONE (DELTASONE) 10 MG tablet Take 1 tablet three times a day with a meal for three for three days, take 1 tablet by twice daily with a meal for 3 days, take 1 tablet once daily with a meal for 3 days 01/17/21   Theotis Burrow, NP  QUEtiapine (SEROQUEL) 25 MG tablet Take 1 tablet (25 mg total) by mouth at bedtime. 11/19/20   Theotis Burrow, NP  roflumilast (DALIRESP) 500 MCG TABS tablet TAKE 1 TABLET(500 MCG) BY MOUTH DAILY 10/30/20   Theotis Burrow, NP  tiotropium (SPIRIVA) 18 MCG inhalation capsule Place 1 capsule (18 mcg total) into inhaler and inhale daily. 11/19/20   Theotis Burrow, NP    Physical Exam: Vitals:   02/02/21 0230 02/02/21 0245 02/02/21 0300 02/02/21 0330  BP: 123/70 119/64 (!) 111/59 129/79  Pulse: 94 93 89 92  Resp: (!) 21 (!) 23 (!) 24 18  Temp:      SpO2: 91% 95% 93% 92%  Weight:      Height:         Vitals:   02/02/21 0230 02/02/21 0245 02/02/21 0300 02/02/21 0330  BP: 123/70 119/64 (!) 111/59 129/79  Pulse: 94 93 89 92  Resp: (!) 21 (!) 23 (!) 24 18  Temp:      SpO2: 91% 95% 93% 92%  Weight:      Height:          Constitutional: Alert and oriented x 3 .  Mild conversational dyspnea HEENT:      Head: Normocephalic and atraumatic.         Eyes: PERLA, EOMI, Conjunctivae are normal. Sclera is non-icteric.       Mouth/Throat: Mucous membranes are moist.       Neck: Supple with no signs of meningismus. Cardiovascular: Regular rate and rhythm. No murmurs, gallops, or rubs. 2+ symmetrical distal pulses are present . No JVD. No LE  edema Respiratory: Respiratory effort increased.Lungs sounds diminished bilaterally.  Few scattered rhonchi.  Gastrointestinal: Soft, non tender, and non distended with positive bowel sounds.  Genitourinary: No CVA tenderness. Musculoskeletal: Nontender with normal range of motion in all extremities. No cyanosis, or erythema of extremities. Neurologic:  Face is symmetric. Moving all extremities. No gross focal neurologic deficits . Skin: Skin is warm, dry.  No rash or ulcers Psychiatric: Mood and affect are normal    Labs on Admission: I have personally reviewed following labs and imaging studies  CBC: Recent Labs  Lab Feb 14, 2021 2300  WBC 13.3*  HGB 14.6  HCT 42.6  MCV 93.4  PLT 274   Basic Metabolic Panel: Recent Labs  Lab 2021/02/19 2300  NA 136  K 3.2*  CL 104  CO2 22  GLUCOSE 98  BUN 8  CREATININE 0.83  CALCIUM 8.2*   GFR: Estimated Creatinine Clearance: 68 mL/min (by C-G formula based on SCr of 0.83 mg/dL). Liver Function Tests: No results for input(s): AST, ALT, ALKPHOS, BILITOT, PROT, ALBUMIN in the last 168 hours. No results for input(s): LIPASE, AMYLASE in the last 168 hours. No results for input(s): AMMONIA in the last 168 hours. Coagulation Profile: No results for input(s): INR, PROTIME in the last 168 hours. Cardiac Enzymes: No results for input(s): CKTOTAL, CKMB, CKMBINDEX, TROPONINI in the last 168 hours. BNP (last 3 results) No results for input(s): PROBNP in the last 8760 hours. HbA1C: No results for input(s): HGBA1C in the last 72 hours. CBG: No results for input(s): GLUCAP in the last 168 hours. Lipid Profile: No results for input(s): CHOL, HDL, LDLCALC, TRIG, CHOLHDL, LDLDIRECT in the last 72 hours. Thyroid Function Tests: No results for input(s): TSH, T4TOTAL, FREET4, T3FREE, THYROIDAB in the last 72 hours. Anemia Panel: No results for input(s): VITAMINB12, FOLATE, FERRITIN, TIBC, IRON, RETICCTPCT in the last 72 hours. Urine analysis:     Component Value Date/Time   APPEARANCEUR Clear 06/26/2020 1109   GLUCOSEU Negative 06/26/2020 1109   BILIRUBINUR Negative 06/26/2020 1109   PROTEINUR Negative 06/26/2020 1109   NITRITE Negative 06/26/2020 1109   LEUKOCYTESUR Negative 06/26/2020 1109    Radiological Exams on Admission: DG Chest Portable 1 View  Result Date: 02/02/2021 CLINICAL DATA:  Shortness of breath. EXAM: PORTABLE CHEST 1 VIEW COMPARISON:  January 17, 2020 FINDINGS: Chronic appearing increased lung markings are seen. Very mild atelectasis is noted within the bilateral lung bases. There is no evidence of acute infiltrate, pleural effusion or pneumothorax. The heart size and mediastinal contours are within normal limits. There is tortuosity of the descending thoracic aorta. Degenerative changes seen throughout the thoracic spine. IMPRESSION: Chronic interstitial changes without acute or active cardiopulmonary disease. Electronically Signed   By: Aram Candela M.D.   On: 02/02/2021 01:02     Assessment/Plan 65 year old female with history of HTN, COPD and anxiety presenting with wheezing and chest tightness    COPD exacerbation (HCC) Acute respiratory failure with hypoxemia (HCC) -Patient presents with wheezing and increased work of breathing and desaturation to 85% on room air with ambulation posttreatment -Schedule and as needed bronchodilator treatments - IV Solu-Medrol - Supplemental O2 to keep sats over 92%     Generalized anxiety disorder - Continue home Xanax and Seroquel pending med rec    Essential hypertension -Continue home losartan    DVT prophylaxis: Lovenox  Code Status: full code  Family Communication:  none  Disposition Plan: Back to previous home environment Consults called: none  Status: Observation    Andris Baumann MD [facility]     02/02/2021, 4:35 AM

## 2021-02-02 NOTE — Progress Notes (Signed)
Pt son verbalized he and the family do not want pt to be transferred to another level of care here at [facility] due pt past history with facility. Stated that he wants to be called if pt needs to be transferred. Son would like pt to got to [facility], pt son, Bernette Redbird was made aware during day shift that there  Is no bed available at [facility]. Explained to son that if pt needs level of care, deferring to him would be a delay in care. Stated he still wants to be called and will be available if needed

## 2021-02-02 NOTE — Procedures (Signed)
Intubation Procedure Note  Alicia Rivera  [id number]  05-Jul-1956  Date:02/02/21  Time:11:52 PM   Provider Performing:Dorthia Tout L Rust-Chester    Procedure: Intubation (31500)  Indication(s) Respiratory Failure  Consent Unable to obtain written consent due to emergent nature of procedure. Discussed the need to intubate due to the patient's respiratory distress that has been refractory to intervention. Patient nodded head yes and stated "go ahead and do it, but knock me out first."   Anesthesia Etomidate, Versed and Fentanyl & vecuronium   Time Out Verified patient identification, verified procedure, site/side was marked, verified correct patient position, special equipment/implants available, medications/allergies/relevant history reviewed, required imaging and test results available.   Sterile Technique Usual hand hygeine, masks, and gloves were used   Procedure Description Patient positioned in bed supine.  Sedation given as noted above.  Patient was intubated with endotracheal tube using Glidescope.  View was Grade 1 full glottis .  Number of attempts was 1.  Colorimetric CO2 detector was consistent with tracheal placement.   Complications/Tolerance None; patient tolerated the procedure well. Chest X-ray is ordered to verify placement.   EBL Minimal   Specimen(s) None  Discussed case with Dr. Karna Christmas & Dr. Para March. Son Alicia Rivera was alerted to emergent need for intubation- all questions and concerns answered at this time.   Cheryll Cockayne Rust-Chester, AGACNP-BC Acute Care Nurse Practitioner [facility]   [phone] / [phone] Please see Amion for pager details.

## 2021-02-02 NOTE — Progress Notes (Signed)
Pt requesting PRN for increased anxiety associated with labored breathing. PRN administered and RT called. Noted labored breathing while at rest. Dr. Para March made aware and ordered for ABG obtained and implemented by RT. Pt is alert and orientated and verbalized that she continues to struggle with breathing and would like to be transferred to another level of care if needed.  Dr. Para March made aware.

## 2021-02-02 NOTE — Progress Notes (Signed)
OT Cancellation Note  Patient Details Name: Alicia Rivera MRN: [id number] DOB: [birth year]   Cancelled Treatment:    Reason Eval/Treat Not Completed: Medical issues which prohibited therapy  Pt anxiety and tearful - Dr Janee Morn recommend to hold off today for eval until tomorrow   Oletta Cohn OTR/L,CLT 02/02/2021, 10:32 AM

## 2021-02-02 NOTE — Consult Note (Incomplete)
NAME:  Alicia Rivera, MRN:  [id number], DOB:  October 20, 1955, LOS: 0 ADMISSION DATE:  01/06/2021, CONSULTATION DATE:  *** REFERRING MD:  ***, CHIEF COMPLAINT:  ***   History of Present Illness:  ***  Pertinent  Medical History  ***  Significant Hospital Events: Including procedures, antibiotic start and stop dates in addition to other pertinent events   .   Interim History / Subjective:  ***  Objective   Blood pressure (!) 163/102, pulse (!) 120, temperature 98.1 F (36.7 C), temperature source Oral, resp. rate (!) 32, height 5\' 4"  (1.626 m), weight 81.6 kg, SpO2 95 %.    Vent Mode: PRVC FiO2 (%):  [50 %] 50 % Set Rate:  [20 bmp] 20 bmp Vt Set:  [450 mL] 450 mL PEEP:  [5 cmH20] 5 cmH20 Plateau Pressure:  [22 cmH20] 22 cmH20   Intake/Output Summary (Last 24 hours) at 02/02/2021 2357 Last data filed at 02/02/2021 1849 Gross per 24 hour  Intake 340 ml  Output -  Net 340 ml   Filed Weights   01/31/2021 2259  Weight: 81.6 kg    Examination: General: Adult ***, critically***chronically ill, lying in bed intubated & sedated requiring mechanical ventilation *** NAD HEENT: MM pink/moist, anicteric***, atraumatic, neck supple Neuro: A&O x *** commands, PERRL *** , MAE CV: s1s2 ***RRR, *** on monitor, no r/m/g Pulm: Regular, non labored on *** , breath sounds ***-BUL & ***-BLL GI: soft, ***, non***tender, bs x 4 GU: foley in place *** with clear yellow urine Skin: *** no rashes/lesions noted Extremities: warm/dry, pulses + 2 R/P, *** edema noted  Labs/imaging that I have personally reviewed  (right click and "Reselect all SmartList Selections" daily)  ***  Labs/ Imaging personally reviewed I, 02/03/21 Rust-Chester, AGACNP-BC, personally viewed and interpreted this ECG. EKG Interpretation Date: *** EKG Time: *** Rate: *** Rhythm: *** QRS Axis:  *** Intervals: *** ST/T Wave abnormalities: *** Narrative Interpretation: ***  Net: *** Na+/ K+: *** BUN/Cr.: *** Serum  CO2/ AG: ***  Hgb: *** Troponin: *** BNP: ***  WBC/ TMAX: *** Lactic/ PCT: ***  ABG: *** CXR ***: *** Resolved Hospital Problem list     Assessment & Plan:  Acute Hypoxic / Hypercapnic Respiratory Failure secondary to / in the setting of *** (can add any attempts at failure to wean, s/p trach date, discontinuation of any major drips including paralytics and benzos) PMHx: *** - Ventilator settings: PRVC  6***8 mL/kg, *** FiO2, *** PEEP, continue ventilator support & lung protective strategies - Wean PEEP & FiO2 as tolerated, maintain SpO2 > 90%*** - Head of bed elevated 30 degrees, VAP protocol in place - Plateau pressures less than 30 cm H20  - Intermittent chest x-ray & ABG PRN*** - Daily WUA with SBT as tolerated  - Ensure adequate pulmonary hygiene  - F/u cultures, trend PCT - Continue CAP/***Aspiration Pna coverage cefepime/ *** unasyn - Steroids initiated: solu-medrol *** mg BID /*** - Budesonide inhaler***nebs BID, bronchodilators PRN - PAD protocol in place: continue Fentanyl***Dilaudid drip***IVP & Propofol***Versed drip***IVP   Best practice (right click and "Reselect all SmartList Selections" daily)  Diet:  NPO Pain/Anxiety/Delirium protocol (if indicated): Yes (RASS goal -1) VAP protocol (if indicated): Yes DVT prophylaxis: {DVT Prophylaxis:26933} GI prophylaxis: {GI:26934} Glucose control:  {Glucose Control:26935} Central venous access:  {Central Venous Access:26936} Arterial line:  {Central Venous Access:26936} Foley:  {Central Venous Access:26936} Mobility:  {Mobility:26937}  PT consulted: {PT Consult:26938} Last date of multidisciplinary goals of care discussion [***] Code  Status:  {Code Status:26939} Disposition: ***  Labs   CBC: Recent Labs  Lab 01/10/2021 2300  WBC 13.3*  HGB 14.6  HCT 42.6  MCV 93.4  PLT 274    Basic Metabolic Panel: Recent Labs  Lab 01/20/2021 2300  NA 136  K 3.2*  CL 104  CO2 22  GLUCOSE 98  BUN 8  CREATININE 0.83   CALCIUM 8.2*  MG 4.8*   GFR: Estimated Creatinine Clearance: 68 mL/min (by C-G formula based on SCr of 0.83 mg/dL). Recent Labs  Lab 01/31/2021 2300  PROCALCITON <0.10  WBC 13.3*    Liver Function Tests: No results for input(s): AST, ALT, ALKPHOS, BILITOT, PROT, ALBUMIN in the last 168 hours. No results for input(s): LIPASE, AMYLASE in the last 168 hours. No results for input(s): AMMONIA in the last 168 hours.  ABG    Component Value Date/Time   PHART 7.50 (H) 02/02/2021 2230   PCO2ART 31 (L) 02/02/2021 2230   PO2ART 64 (L) 02/02/2021 2230   HCO3 24.2 02/02/2021 2230   ACIDBASEDEF 2.3 (H) 02/02/2021 1003   O2SAT 94.0 02/02/2021 2230     Coagulation Profile: No results for input(s): INR, PROTIME in the last 168 hours.  Cardiac Enzymes: No results for input(s): CKTOTAL, CKMB, CKMBINDEX, TROPONINI in the last 168 hours.  HbA1C: No results found for: HGBA1C  CBG: No results for input(s): GLUCAP in the last 168 hours.  Review of Systems: Positives in BOLD  Unable to perform full ROS d/t impending respiratory failure and need for emergent intubation. PULM: Denies shortness of breath, cough, sputum production, hemoptysis, wheezing  Past Medical History:  She,  has a past medical history of Anxiety, COPD (chronic obstructive pulmonary disease) (HCC), HTN (hypertension), Pneumonia (09/18/2017), and Tobacco abuse.   Surgical History:   Past Surgical History:  Procedure Laterality Date  . CATARACT EXTRACTION W/PHACO Left 12/13/2019   Procedure: CATARACT EXTRACTION PHACO AND INTRAOCULAR LENS PLACEMENT (IOC) LEFT;  Surgeon: Galen Manila, MD;  Location: [facility];  Service: Ophthalmology;  Laterality: Left;  CDE 4.90 U/S 0:36.8  . CATARACT EXTRACTION W/PHACO Right 01/03/2020   Procedure: CATARACT EXTRACTION PHACO AND INTRAOCULAR LENS PLACEMENT (IOC) RIGHT 6.01 00:44.5;  Surgeon: Galen Manila, MD;  Location: [facility];  Service: Ophthalmology;   Laterality: Right;     Social History:   reports that she has been smoking cigarettes. She has smoked for the past 50.00 years. She has never used smokeless tobacco. She reports that she does not drink alcohol and does not use drugs.   Family History:  Her family history includes Emphysema in her brother; Heart disease in her father; Lung disease in her mother.   Allergies Allergies  Allergen Reactions  . Crestor [Rosuvastatin] Nausea And Vomiting    Stopped medication 07/15/20  . Other Hives    trilogy  . Trelegy Ellipta [Fluticasone-Umeclidin-Vilant] Anaphylaxis  . Sulfa Antibiotics   . Sulfasalazine Hives     Home Medications  Prior to Admission medications   Medication Sig Start Date End Date Taking? Authorizing Provider  albuterol (VENTOLIN HFA) 108 (90 Base) MCG/ACT inhaler Inhale 2 puffs into the lungs every 4 (four) hours as needed for wheezing or shortness of breath. 01/22/21  Yes Yevonne Pax, MD  ALPRAZolam Prudy Feeler) 0.25 MG tablet Take 1 tablet (0.25 mg total) by mouth 2 (two) times daily as needed for anxiety. 01/22/21  Yes Yevonne Pax, MD  aspirin 81 MG chewable tablet Chew 81 mg by mouth daily. 01/09/21  Yes [provider]  atorvastatin (LIPITOR) 80 MG tablet Take 80 mg by mouth daily. 12/10/20  Yes [provider]  budesonide-formoterol (SYMBICORT) 160-4.5 MCG/ACT inhaler Inhale 1 puff into the lungs 2 (two) times daily. 11/19/20  Yes Theotis Burrow, NP  DULoxetine (CYMBALTA) 30 MG capsule Take 1 capsule (30 mg total) by mouth daily. 12/10/20  Yes Theotis Burrow, NP  ipratropium (ATROVENT) 0.02 % nebulizer solution USE 1 VIAL VIA NEBULIZER EVERY 6 HOURS AS NEEDED FOR WHEEZING OR SHORTNESS OF BREATH Patient taking differently: Take 0.5 mg by nebulization every 6 (six) hours as needed for wheezing or shortness of breath. 06/28/20  Yes Lyndon Code, MD  losartan-hydrochlorothiazide (HYZAAR) 100-12.5 MG tablet Take 1 tablet by mouth daily.   Yes [provider]  montelukast (SINGULAIR) 10 MG tablet TAKE 1 TABLET(10 MG) BY MOUTH AT BEDTIME Patient taking differently: Take 10 mg by mouth daily. 10/30/20  Yes Theotis Burrow, NP  QUEtiapine (SEROQUEL) 25 MG tablet Take 1 tablet (25 mg total) by mouth at bedtime. 11/19/20  Yes Theotis Burrow, NP  roflumilast (DALIRESP) 500 MCG TABS tablet TAKE 1 TABLET(500 MCG) BY MOUTH DAILY Patient taking differently: Take 500 mcg by mouth daily. 10/30/20  Yes Theotis Burrow, NP  tiotropium (SPIRIVA) 18 MCG inhalation capsule Place 1 capsule (18 mcg total) into inhaler and inhale daily. 11/19/20  Yes Theotis Burrow, NP  cetirizine (ZYRTEC) 10 MG tablet TAKE 1 TABLET(10 MG) BY MOUTH DAILY Patient not taking: No sig reported 12/05/19   Johnna Acosta, NP  levofloxacin (LEVAQUIN) 500 MG tablet Take 1 tablet (500 mg total) by mouth daily. Patient not taking: No sig reported 01/17/21   Theotis Burrow, NP  losartan (COZAAR) 25 MG tablet Take 1 tablet (25 mg total) by mouth daily. Patient not taking: No sig reported 11/19/20   Theotis Burrow, NP  predniSONE (DELTASONE) 10 MG tablet Take 1 tablet three times a day with a meal for three for three days, take 1 tablet by twice daily with a meal for 3 days, take 1 tablet once daily with a meal for 3 days Patient not taking: No sig reported 01/17/21   Theotis Burrow, NP     Critical care time: 55 minutes       Cheryll Cockayne Rust-Chester, AGACNP-BC Acute Care Nurse Practitioner [facility]   [phone] / [phone] Please see Amion for pager details.

## 2021-02-02 NOTE — Progress Notes (Signed)
Attempted to call Bernette Redbird to provide update, phone number on file [phone] not accepting calls when dialed. Called other son, Dannielle Huh and provided update on transfer. Provided room number and ICU phone number.

## 2021-02-02 NOTE — Progress Notes (Signed)
Patient arrived to unit in respiratory distress on 3L [place], accessory muscle use noted and patient placed on NRB. Patient is a transfer from room 107. NP at bedside and patient emregently intubated

## 2021-02-03 ENCOUNTER — Inpatient Hospital Stay: Payer: Medicare Other

## 2021-02-03 ENCOUNTER — Encounter: Payer: Self-pay | Admitting: Internal Medicine

## 2021-02-03 DIAGNOSIS — E785 Hyperlipidemia, unspecified: Secondary | ICD-10-CM

## 2021-02-03 LAB — COMPREHENSIVE METABOLIC PANEL
ALT: 19 U/L (ref 0–44)
AST: 22 U/L (ref 15–41)
Albumin: 3.4 g/dL — ABNORMAL LOW (ref 3.5–5.0)
Alkaline Phosphatase: 54 U/L (ref 38–126)
Anion gap: 9 (ref 5–15)
BUN: 19 mg/dL (ref 8–23)
CO2: 19 mmol/L — ABNORMAL LOW (ref 22–32)
Calcium: 8.3 mg/dL — ABNORMAL LOW (ref 8.9–10.3)
Chloride: 103 mmol/L (ref 98–111)
Creatinine, Ser: 0.77 mg/dL (ref 0.44–1.00)
GFR, Estimated: >60 mL/min (ref >60)
Glucose, Bld: 175 mg/dL — ABNORMAL HIGH (ref 70–99)
Potassium: 4.6 mmol/L (ref 3.5–5.1)
Sodium: 131 mmol/L — ABNORMAL LOW (ref 135–145)
Total Bilirubin: 0.7 mg/dL (ref 0.3–1.2)
Total Protein: 6.1 g/dL — ABNORMAL LOW (ref 6.5–8.1)

## 2021-02-03 LAB — BLOOD GAS, ARTERIAL
Acid-base deficit: 2.4 mmol/L — ABNORMAL HIGH (ref 0.0–2.0)
Bicarbonate: 23.7 mmol/L (ref 20.0–28.0)
FIO2: 50
MECHVT: 450 mL
Mechanical Rate: 20
O2 Saturation: 99.2 %
PEEP: 5 cmH2O
Patient temperature: 37
pCO2 arterial: 45 mmHg (ref 32.0–48.0)
pH, Arterial: 7.33 — ABNORMAL LOW (ref 7.350–7.450)
pO2, Arterial: 153 mmHg — ABNORMAL HIGH (ref 83.0–108.0)

## 2021-02-03 LAB — PHOSPHORUS: Phosphorus: 3.8 mg/dL (ref 2.5–4.6)

## 2021-02-03 LAB — GLUCOSE, CAPILLARY
Glucose-Capillary: 117 mg/dL — ABNORMAL HIGH (ref 70–99)
Glucose-Capillary: 127 mg/dL — ABNORMAL HIGH (ref 70–99)
Glucose-Capillary: 128 mg/dL — ABNORMAL HIGH (ref 70–99)
Glucose-Capillary: 136 mg/dL — ABNORMAL HIGH (ref 70–99)
Glucose-Capillary: 142 mg/dL — ABNORMAL HIGH (ref 70–99)
Glucose-Capillary: 170 mg/dL — ABNORMAL HIGH (ref 70–99)
Glucose-Capillary: 191 mg/dL — ABNORMAL HIGH (ref 70–99)

## 2021-02-03 LAB — LIPID PANEL
Cholesterol: 134 mg/dL (ref 0–200)
HDL: 57 mg/dL (ref >40)
LDL Cholesterol: 65 mg/dL (ref 0–99)
Total CHOL/HDL Ratio: 2.4 RATIO
Triglycerides: 59 mg/dL (ref <150)
VLDL: 12 mg/dL (ref 0–40)

## 2021-02-03 LAB — CBC WITH DIFFERENTIAL/PLATELET
Abs Immature Granulocytes: 0.3 10*3/uL — ABNORMAL HIGH (ref 0.00–0.07)
Basophils Absolute: 0.1 10*3/uL (ref 0.0–0.1)
Basophils Relative: 0 %
Eosinophils Absolute: 0 10*3/uL (ref 0.0–0.5)
Eosinophils Relative: 0 %
HCT: 34.1 % — ABNORMAL LOW (ref 36.0–46.0)
Hemoglobin: 11.3 g/dL — ABNORMAL LOW (ref 12.0–15.0)
Immature Granulocytes: 1 %
Lymphocytes Relative: 5 %
Lymphs Abs: 1.4 10*3/uL (ref 0.7–4.0)
MCH: 31.7 pg (ref 26.0–34.0)
MCHC: 33.1 g/dL (ref 30.0–36.0)
MCV: 95.8 fL (ref 80.0–100.0)
Monocytes Absolute: 1.1 10*3/uL — ABNORMAL HIGH (ref 0.1–1.0)
Monocytes Relative: 4 %
Neutro Abs: 25.9 10*3/uL — ABNORMAL HIGH (ref 1.7–7.7)
Neutrophils Relative %: 90 %
Platelets: 291 10*3/uL (ref 150–400)
RBC: 3.56 MIL/uL — ABNORMAL LOW (ref 3.87–5.11)
RDW: 13.8 % (ref 11.5–15.5)
Smear Review: NORMAL
WBC: 28.7 10*3/uL — ABNORMAL HIGH (ref 4.0–10.5)
nRBC: 0 % (ref 0.0–0.2)

## 2021-02-03 LAB — HIV ANTIBODY (ROUTINE TESTING W REFLEX): HIV Screen 4th Generation wRfx: NONREACTIVE

## 2021-02-03 LAB — URINALYSIS, ROUTINE W REFLEX MICROSCOPIC
Bilirubin Urine: NEGATIVE
Glucose, UA: NEGATIVE mg/dL
Hgb urine dipstick: NEGATIVE
Ketones, ur: NEGATIVE mg/dL
Leukocytes,Ua: NEGATIVE
Nitrite: NEGATIVE
Protein, ur: NEGATIVE mg/dL
Specific Gravity, Urine: 1.035 — ABNORMAL HIGH (ref 1.005–1.030)
pH: 5 (ref 5.0–8.0)

## 2021-02-03 LAB — HEMOGLOBIN A1C
Hgb A1c MFr Bld: 5.7 % — ABNORMAL HIGH (ref 4.8–5.6)
Mean Plasma Glucose: 116.89 mg/dL

## 2021-02-03 LAB — MAGNESIUM: Magnesium: 1.9 mg/dL (ref 1.7–2.4)

## 2021-02-03 LAB — CORTISOL: Cortisol, Plasma: 3.9 ug/dL

## 2021-02-03 LAB — PROCALCITONIN: Procalcitonin: <0.1 ng/mL

## 2021-02-03 LAB — MRSA PCR SCREENING: MRSA by PCR: NEGATIVE

## 2021-02-03 LAB — TRIGLYCERIDES: Triglycerides: 63 mg/dL (ref <150)

## 2021-02-03 LAB — D-DIMER, QUANTITATIVE: D-Dimer, Quant: 1.15 ug/mL-FEU — ABNORMAL HIGH (ref 0.00–0.50)

## 2021-02-03 MED ORDER — CHLORHEXIDINE GLUCONATE CLOTH 2 % EX PADS
6.0000 | MEDICATED_PAD | Freq: Every day | CUTANEOUS | Status: DC
  Administered 2021-02-03 – 2021-02-08 (×5): 6 via TOPICAL

## 2021-02-03 MED ORDER — BUDESONIDE 0.25 MG/2ML IN SUSP
0.2500 mg | Freq: Two times a day (BID) | RESPIRATORY_TRACT | Status: DC
  Administered 2021-02-03 – 2021-02-04 (×3): 0.25 mg via RESPIRATORY_TRACT
  Filled 2021-02-03 (×3): qty 2

## 2021-02-03 MED ORDER — ALPRAZOLAM 0.5 MG PO TABS
0.5000 mg | ORAL_TABLET | Freq: Two times a day (BID) | ORAL | Status: DC
  Administered 2021-02-03 – 2021-02-08 (×10): 0.5 mg
  Filled 2021-02-03 (×10): qty 1

## 2021-02-03 MED ORDER — MIDAZOLAM HCL 2 MG/2ML IJ SOLN
INTRAMUSCULAR | Status: AC
  Filled 2021-02-03: qty 2

## 2021-02-03 MED ORDER — MIDAZOLAM HCL 2 MG/2ML IJ SOLN
2.0000 mg | Freq: Once | INTRAMUSCULAR | Status: AC
  Administered 2021-02-03: 2 mg via INTRAVENOUS

## 2021-02-03 MED ORDER — IOHEXOL 350 MG/ML SOLN
100.0000 mL | Freq: Once | INTRAVENOUS | Status: AC | PRN
  Administered 2021-02-03: 100 mL via INTRAVENOUS

## 2021-02-03 MED ORDER — SODIUM CHLORIDE 0.9 % IV SOLN: 250.0000 mL | INTRAVENOUS | Status: DC

## 2021-02-03 MED ORDER — ALPRAZOLAM 0.5 MG PO TABS: 0.5000 mg | ORAL_TABLET | Freq: Two times a day (BID) | ORAL | Status: DC | PRN

## 2021-02-03 MED ORDER — PANTOPRAZOLE SODIUM 40 MG PO PACK
40.0000 mg | PACK | Freq: Every day | ORAL | Status: DC
  Administered 2021-02-03: 40 mg
  Filled 2021-02-03 (×2): qty 20

## 2021-02-03 MED ORDER — METHYLPREDNISOLONE SODIUM SUCC 125 MG IJ SOLR
60.0000 mg | Freq: Two times a day (BID) | INTRAMUSCULAR | Status: DC
  Administered 2021-02-03 – 2021-02-04 (×4): 60 mg via INTRAVENOUS
  Filled 2021-02-03 (×4): qty 2

## 2021-02-03 MED ORDER — NOREPINEPHRINE 4 MG/250ML-% IV SOLN
2.0000 ug/min | INTRAVENOUS | Status: DC
  Administered 2021-02-03: 1 ug/min via INTRAVENOUS
  Filled 2021-02-03: qty 250

## 2021-02-03 MED ORDER — LORATADINE 10 MG PO TABS
10.0000 mg | ORAL_TABLET | Freq: Every day | ORAL | Status: DC
  Administered 2021-02-03 – 2021-02-08 (×6): 10 mg
  Filled 2021-02-03 (×6): qty 1

## 2021-02-03 MED ORDER — INSULIN ASPART 100 UNIT/ML IJ SOLN
0.0000 [IU] | INTRAMUSCULAR | Status: DC
  Administered 2021-02-03 (×3): 2 [IU] via SUBCUTANEOUS
  Administered 2021-02-03: 3 [IU] via SUBCUTANEOUS
  Administered 2021-02-04 (×2): 2 [IU] via SUBCUTANEOUS
  Administered 2021-02-04: 3 [IU] via SUBCUTANEOUS
  Administered 2021-02-04 – 2021-02-05 (×4): 2 [IU] via SUBCUTANEOUS
  Administered 2021-02-05: 3 [IU] via SUBCUTANEOUS
  Administered 2021-02-05 – 2021-02-06 (×4): 2 [IU] via SUBCUTANEOUS
  Administered 2021-02-06: 3 [IU] via SUBCUTANEOUS
  Administered 2021-02-06 (×4): 2 [IU] via SUBCUTANEOUS
  Administered 2021-02-07 (×2): 3 [IU] via SUBCUTANEOUS
  Administered 2021-02-07 – 2021-02-08 (×5): 2 [IU] via SUBCUTANEOUS
  Administered 2021-02-08: 3 [IU] via SUBCUTANEOUS
  Filled 2021-02-03 (×28): qty 1

## 2021-02-03 MED ORDER — SODIUM CHLORIDE 0.9 % IV SOLN
500.0000 mg | INTRAVENOUS | Status: AC
  Administered 2021-02-03 – 2021-02-07 (×5): 500 mg via INTRAVENOUS
  Filled 2021-02-03 (×5): qty 500

## 2021-02-03 MED ORDER — LACTATED RINGERS IV SOLN: INTRAVENOUS | Status: AC

## 2021-02-03 MED ORDER — QUETIAPINE FUMARATE 25 MG PO TABS
25.0000 mg | ORAL_TABLET | Freq: Every day | ORAL | Status: DC
  Administered 2021-02-03 – 2021-02-07 (×5): 25 mg
  Filled 2021-02-03 (×5): qty 1

## 2021-02-03 MED ORDER — SODIUM CHLORIDE 0.9 % IV SOLN
INTRAVENOUS | Status: DC | PRN
  Administered 2021-02-03 – 2021-02-04 (×2): 250 mL via INTRAVENOUS

## 2021-02-03 MED ORDER — SODIUM CHLORIDE 0.9 % IV BOLUS
1000.0000 mL | Freq: Once | INTRAVENOUS | Status: AC
  Administered 2021-02-02: 1000 mL via INTRAVENOUS

## 2021-02-03 MED ORDER — ACETAMINOPHEN 325 MG PO TABS: 650.0000 mg | ORAL_TABLET | Freq: Four times a day (QID) | ORAL | Status: DC | PRN

## 2021-02-03 MED ORDER — NOREPINEPHRINE 4 MG/250ML-% IV SOLN
INTRAVENOUS | Status: AC
  Administered 2021-02-03: 2 ug/min via INTRAVENOUS
  Filled 2021-02-03: qty 250

## 2021-02-03 NOTE — Progress Notes (Signed)
Patient ventilator alarming. Found patient sitting up in bed, flushed facial, fighting the ventilator. HR 130s, O2 saturation 84%. Sedation increased and 1mg  Versed given as ordered PRN. Britton-Lee, NP at bedside. Patient stabilized and VSs returned within normal limits. Patient son updated via telephone. Will continue to monitor.

## 2021-02-03 NOTE — Progress Notes (Signed)
MD to bedside to speak with son and daughter. MD introduced himself, and son immediately became verbally defensive, asking why patient hadn't been transferred to [facility] yet, stating that they had requested transfer as soon as they got to the hospital. MD explained that he wasn't here and this was his first time meeting the patient, and son and daughter became to talk loudly over MD stating again that they had requested transfer the day prior and wanted to know why nothing had been done about this. MD again trying to speak with son and daughter, however they continued to be verbally aggressive with him. Shortly after, son excused himself to go outside and MD left room to go call [facility].

## 2021-02-03 NOTE — Plan of Care (Signed)

## 2021-02-03 NOTE — Progress Notes (Signed)
Patient down for CTA with primary RN, charge RN, and RT.

## 2021-02-03 NOTE — Progress Notes (Signed)
Patient's family requested transfer to [facility] for " COPD specialist". Transfer to [facility] medical center was initiated however per [facility] transfer center coordinator they are at capacity with no ICU meds. Will try again later.    Webb Silversmith, DNP, CCRN, FNP-C, AGACNP-BC Acute Care Nurse Practitioner  [facility] Pager: [phone] [facility]

## 2021-02-03 NOTE — Progress Notes (Signed)
AC and NP Ouma spoke with family downstairs regarding why [facility] wouldn't accept patient at this time. Per Loraine Grip, family understands and has no questions at this time.

## 2021-02-03 NOTE — Progress Notes (Signed)
Initial Nutrition Assessment  DOCUMENTATION CODES:   Obesity unspecified  INTERVENTION:   Tube feeding recommendations: -Vital HP @ 50 ml/hr via OGT -Prosource TF BID -Provides 1280 kcals, 127g protein and 1003 ml H2O  NUTRITION DIAGNOSIS:   Inadequate oral intake related to inability to eat as evidenced by NPO status.  GOAL:   Provide needs based on ASPEN/SCCM guidelines  MONITOR:   Vent status,Labs,Weight trends,I & O's  REASON FOR ASSESSMENT:   Consult,Ventilator Assessment of nutrition requirement/status  ASSESSMENT:   65 year old female with a known past medical history of COPD, anxiety & current smoker presenting to [facility] ED with several day history of wheezing and shortness of breath that became acutely worse overnight on 25-Feb-2021.  Per ED documentation she reported using her inhalers at home and denied any changes to her chronic cough, but did endorse chest tightness without chest pain.  Pt emergently intubated as of 2350 on 4/30 d/t respiratory distress per chart review.  Pt was on a heart healthy diet but only consuming bites of meals.  Pt sedated on propofol. With OGT placed. If pt to remain on vent, recommend initiation of enteral nutrition. Recommendations above.  Patient is currently intubated on ventilator support MV: 7.4 L/min Temp (24hrs), Avg:97.7 F (36.5 C), Min:96.8 F (36 C), Max:98.3 F (36.8 C)  Per weight records, no weight loss noted.  Propofol: 25.3 ml/hr -provides ~667 fat kcals  Medications: Colace, Miralax, Fentanyl, Levophed  Labs reviewed:  CBGs: 170-191 Low Na  NUTRITION - FOCUSED PHYSICAL EXAM:  Unable to complete at this time -working remote  Diet Order:   Diet Order            Diet NPO time specified  Diet effective now                 EDUCATION NEEDS:   No education needs have been identified at this time  Skin:  Skin Assessment: Reviewed RN Assessment  Last BM:  4/30 -type 5  Height:   Ht Readings  from Last 1 Encounters:  02/02/21 5\' 4"  (1.626 m)    Weight:   Wt Readings from Last 1 Encounters:  02/02/21 84.4 kg   BMI:  Body mass index is 31.94 kg/m.  Estimated Nutritional Needs:   Kcal:  02/04/21  Protein:  130-140g  Fluid:  1.5L/day  [phone], MS, RD, LDN Inpatient Clinical Dietitian Contact information available via Amion

## 2021-02-03 NOTE — Progress Notes (Signed)
NAME:  Alicia Rivera, MRN:  [id number], DOB:  1956/05/22, LOS: 1 ADMISSION DATE:  01/05/2021, CONSULTATION DATE: 01/14/2021 REFERRING MD: Dr. Damita Dunnings, CHIEF COMPLAINT: Shortness of breath  History of Present Illness:  65 year old female with a known past medical history of COPD, anxiety & current smoker presenting to [facility] ED with several day history of wheezing and shortness of breath that became acutely worse overnight on 01/31/2021.  Per ED documentation she reported using her inhalers at home and denied any changes to her chronic cough, but did endorse chest tightness without chest pain.  She confirmed she is still smoking cigarettes.    Pertinent  Medical History  Current smoker COPD -not on home oxygen Anxiety Hypertension Hyperlipidemia Ischemic left thalamic stroke -10/2020 TIA   Events: Including procedures, antibiotic start and stop dates in addition to other pertinent events   . 01/17/2021-admitted with [facility] for COPD exacerbation requiring acute oxygen . 02/02/2021- urgently transferred to ICU due to respiratory distress requiring emergent RSI and mechanical ventilation . 02/03/21- patient remains on ventilator - met with family (two people son and daughter) they were very irate and wanted patient immediately moved to [facility] to be evaluted by COPD specialist. I have explained this is very unlikely to occur and have agreed to request transfer to accommodate family.       Objective   Blood pressure 100/61, pulse 66, temperature (!) 97.1 F (36.2 C), temperature source Axillary, resp. rate 20, height '5\' 4"'  (1.626 m), weight 84.4 kg, SpO2 99 %.    Vent Mode: PRVC FiO2 (%):  [40 %-50 %] 40 % Set Rate:  [20 bmp] 20 bmp Vt Set:  [450 mL] 450 mL PEEP:  [5 cmH20] 5 cmH20 Plateau Pressure:  [18 cmH20-24 cmH20] 24 cmH20   Intake/Output Summary (Last 24 hours) at 02/03/2021 1134 Last data filed at 02/03/2021 1191 Gross per 24 hour  Intake 2699.1 ml  Output 400 ml  Net 2299.1 ml    Filed Weights   01/15/2021 2259 02/02/21 2345  Weight: 81.6 kg 84.4 kg    Examination: General: Adult female older then stated age 12: MM pink/moist, anicteric, atraumatic, neck supple Neuro: GCS-4t on ventilator CV: s1s2 RRR, sinus tachycardia on monitor, no r/m/g Pulm: Tachypneic/dyspneic/labored on 3 L nasal cannula, breath sounds severely diminished throughout GI: soft, rounded, non tender, bs x 4 Skin: Flushed, limited exam- no rashes/lesions noted Extremities: mildly edemaotous   IMAGING     Assessment & Plan:  Acute on chronic hypoxic Respiratory Failure secondary to COPD exacerbation  Sedation associated hypotension PMHx: COPD (no home oxygen), current smoker Per chart review patient had a recent COPD exacerbation and was placed on Levaquin with a prednisone taper on 01/17/2021. - Ventilator settings: PRVC  8 mL/kg, 50% FiO2, 5 PEEP, continue ventilator support & lung protective strategies - Wean PEEP & FiO2 as tolerated, maintain SpO2 > 88% - Head of bed elevated 30 degrees, VAP protocol in place - Plateau pressures less than 30 cm H20  - Intermittent chest x-ray & ABG PRN - Daily WUA with SBT as tolerated  - Ensure adequate pulmonary hygiene  - trend PCT - Continue ceftriaxone & add azithromycin for COPD exacerbation - Steroids slightly tapered from 125 mg every 8 h to: solu-medrol 60 mg BID - Budesonide nebs BID, bronchodilators PRN - PAD protocol in place: continue Fentanyl drip & Propofol drip, Versed PRN -Patient received 1 L NS bolus, will follow with peripheral Levophed PRN to maintain MAP > 65 -  Rule out for PE, D-dimer elevated > CT angio pending          -have asked for [facility] transfer per family request to see - PCCM provider has already spoken with [facility] and they have explained this institution is at capacity and are unable to take patient.   Anxiety -Restart patient's home Xanax as a BID PRN to assist with anxiety -Continue Seroquel nightly -Supportive  care  Hypertension -Marginal blood pressures post intubation with sedation on board -We will hold home regimen: Losartan, consider restarting as patient stabilizes  Hyperlipidemia PMHx: Left thalamic stroke, TIA -Per chart review patient was prescribed Lipitor 80 mg and baby aspirin, unclear if she is currently taking both -No current LFT's, will follow up with liver enzymes & lipid panel with a.m. labs consider restarting Lipitor as needed.  At risk for steroid induced hyperglycemia -Every 4 CBG monitoring -SSI as needed to maintain blood sugar between 140-180 -Follow ICU hypo-/hyper-glycemia protocol  Best practice (right click and "Reselect all SmartList Selections" daily)  Diet:  NPO Pain/Anxiety/Delirium protocol (if indicated): Yes (RASS goal -1) VAP protocol (if indicated): Yes DVT prophylaxis: LMWH GI prophylaxis: H2B Glucose control:  SSI No Central venous access:  N/A Arterial line:  N/A Foley:  N/A Mobility:  bed rest  PT consulted: N/A Last date of multidisciplinary goals of care discussion 02/02/21 Code Status:  full code Disposition: ICU  Labs   CBC: Recent Labs  Lab 01/15/2021 2300 02/03/21 0414  WBC 13.3* 28.7*  NEUTROABS  --  25.9*  HGB 14.6 11.3*  HCT 42.6 34.1*  MCV 93.4 95.8  PLT 274 789    Basic Metabolic Panel: Recent Labs  Lab 01/17/2021 2300 02/03/21 0414  NA 136 131*  K 3.2* 4.6  CL 104 103  CO2 22 19*  GLUCOSE 98 175*  BUN 8 19  CREATININE 0.83 0.77  CALCIUM 8.2* 8.3*  MG 4.8* 1.9  PHOS  --  3.8   GFR: Estimated Creatinine Clearance: 71.7 mL/min (by C-G formula based on SCr of 0.77 mg/dL). Recent Labs  Lab 01/10/2021 2300 02/03/21 0414  PROCALCITON <0.10 <0.10  WBC 13.3* 28.7*    Liver Function Tests: Recent Labs  Lab 02/03/21 0414  AST 22  ALT 19  ALKPHOS 54  BILITOT 0.7  PROT 6.1*  ALBUMIN 3.4*   No results for input(s): LIPASE, AMYLASE in the last 168 hours. No results for input(s): AMMONIA in the last 168  hours.  ABG    Component Value Date/Time   PHART 7.33 (L) 02/03/2021 0052   PCO2ART 45 02/03/2021 0052   PO2ART 153 (H) 02/03/2021 0052   HCO3 23.7 02/03/2021 0052   ACIDBASEDEF 2.4 (H) 02/03/2021 0052   O2SAT 99.2 02/03/2021 0052     Coagulation Profile: No results for input(s): INR, PROTIME in the last 168 hours.  Cardiac Enzymes: No results for input(s): CKTOTAL, CKMB, CKMBINDEX, TROPONINI in the last 168 hours.  HbA1C: No results found for: HGBA1C  CBG: Recent Labs  Lab 02/02/21 2322 02/03/21 0433 02/03/21 0737 02/03/21 1122  GLUCAP 136* 191* 170* 142*    Review of Systems: Positives in BOLD  Unable to perform full ROS d/t impending respiratory failure and need for emergent intubation. PULM: Denies shortness of breath, cough, sputum production, hemoptysis, wheezing  Past Medical History:  She,  has a past medical history of Anxiety, COPD (chronic obstructive pulmonary disease) ([place]), HTN (hypertension), Hyperlipidemia, Ischemic cerebrovascular accident (CVA) ([place]) (10/2020), Pneumonia (09/18/2017), TIA (transient ischemic attack), and Tobacco  abuse.   Surgical History:   Past Surgical History:  Procedure Laterality Date  . CATARACT EXTRACTION W/PHACO Left 12/13/2019   Procedure: CATARACT EXTRACTION PHACO AND INTRAOCULAR LENS PLACEMENT ([place]) LEFT;  Surgeon: Birder Robson, MD;  Location: [place];  Service: Ophthalmology;  Laterality: Left;  CDE 4.90 U/S 0:36.8  . CATARACT EXTRACTION W/PHACO Right 01/03/2020   Procedure: CATARACT EXTRACTION PHACO AND INTRAOCULAR LENS PLACEMENT ([facility]) RIGHT 6.01 00:44.5;  Surgeon: Birder Robson, MD;  Location: [place];  Service: Ophthalmology;  Laterality: Right;     Social History:   reports that she has been smoking cigarettes. She has smoked for the past 50.00 years. She has never used smokeless tobacco. She reports that she does not drink alcohol and does not use drugs.   Family History:  Her  family history includes Emphysema in her brother; Heart disease in her father; Lung disease in her mother.   Allergies Allergies  Allergen Reactions  . Crestor [Rosuvastatin] Nausea And Vomiting    Stopped medication 07/15/20  . Other Hives    trilogy  . Trelegy Ellipta [Fluticasone-Umeclidin-Vilant] Anaphylaxis  . Sulfa Antibiotics   . Sulfasalazine Hives     Home Medications  Prior to Admission medications   Medication Sig Start Date End Date Taking? Authorizing Provider  albuterol (VENTOLIN HFA) 108 (90 Base) MCG/ACT inhaler Inhale 2 puffs into the lungs every 4 (four) hours as needed for wheezing or shortness of breath. 01/22/21  Yes Allyne Gee, MD  ALPRAZolam Duanne Moron) 0.25 MG tablet Take 1 tablet (0.25 mg total) by mouth 2 (two) times daily as needed for anxiety. 01/22/21  Yes Allyne Gee, MD  aspirin 81 MG chewable tablet Chew 81 mg by mouth daily. 01/09/21  Yes [provider]  atorvastatin (LIPITOR) 80 MG tablet Take 80 mg by mouth daily. 12/10/20  Yes [provider]  budesonide-formoterol (SYMBICORT) 160-4.5 MCG/ACT inhaler Inhale 1 puff into the lungs 2 (two) times daily. 11/19/20  Yes Luiz Ochoa, NP  DULoxetine (CYMBALTA) 30 MG capsule Take 1 capsule (30 mg total) by mouth daily. 12/10/20  Yes Luiz Ochoa, NP  ipratropium (ATROVENT) 0.02 % nebulizer solution USE 1 VIAL VIA NEBULIZER EVERY 6 HOURS AS NEEDED FOR WHEEZING OR SHORTNESS OF BREATH Patient taking differently: Take 0.5 mg by nebulization every 6 (six) hours as needed for wheezing or shortness of breath. 06/28/20  Yes Lavera Guise, MD  losartan-hydrochlorothiazide (HYZAAR) 100-12.5 MG tablet Take 1 tablet by mouth daily.   Yes [provider]  montelukast (SINGULAIR) 10 MG tablet TAKE 1 TABLET(10 MG) BY MOUTH AT BEDTIME Patient taking differently: Take 10 mg by mouth daily. 10/30/20  Yes Luiz Ochoa, NP  QUEtiapine (SEROQUEL) 25 MG tablet Take 1 tablet (25 mg total) by mouth at  bedtime. 11/19/20  Yes Luiz Ochoa, NP  roflumilast (DALIRESP) 500 MCG TABS tablet TAKE 1 TABLET(500 MCG) BY MOUTH DAILY Patient taking differently: Take 500 mcg by mouth daily. 10/30/20  Yes Luiz Ochoa, NP  tiotropium (SPIRIVA) 18 MCG inhalation capsule Place 1 capsule (18 mcg total) into inhaler and inhale daily. 11/19/20  Yes Luiz Ochoa, NP  cetirizine (ZYRTEC) 10 MG tablet TAKE 1 TABLET(10 MG) BY MOUTH DAILY Patient not taking: No sig reported 12/05/19   Kendell Bane, NP  levofloxacin (LEVAQUIN) 500 MG tablet Take 1 tablet (500 mg total) by mouth daily. Patient not taking: No sig reported 01/17/21   Luiz Ochoa, NP  losartan (COZAAR)  25 MG tablet Take 1 tablet (25 mg total) by mouth daily. Patient not taking: No sig reported 11/19/20   Luiz Ochoa, NP  predniSONE (DELTASONE) 10 MG tablet Take 1 tablet three times a day with a meal for three for three days, take 1 tablet by twice daily with a meal for 3 days, take 1 tablet once daily with a meal for 3 days Patient not taking: No sig reported 01/17/21   Luiz Ochoa, NP     Critical care provider statement:    Critical care time (minutes):  33   Critical care time was exclusive of:  Separately billable procedures and  treating other patients   Critical care was necessary to treat or prevent imminent or  life-threatening deterioration of the following conditions:  Severe Acute COPD exacerbation, acute hypoxemic respiratory failure, tobacco smoking, anxiety disorder, multiple comorbid conditions.   Critical care was time spent personally by me on the following  activities:  Development of treatment plan with patient or surrogate,  discussions with consultants, evaluation of patient's response to  treatment, examination of patient, obtaining history from patient or  surrogate, ordering and performing treatments and interventions, ordering  and review of laboratory studies and re-evaluation of patient's condition    I assumed direction of critical care for this patient from another  provider in my specialty: no          Ottie Glazier, M.D.  Pulmonary & [place]

## 2021-02-03 NOTE — Progress Notes (Signed)
Patient's son and daughter express that they would like patient transferred to [facility], state that they wanted EMS to take her there but they wouldn't. MD notified that family would like to speak with him.

## 2021-02-03 NOTE — Progress Notes (Signed)
Patient's son Dannielle Huh and daughter [name] at bedside. Asking what led patient to be intubated versus using other means such as bipap, and asking if when bipap was attempted was she medicated for anxiety due to her severe claustrophobia. Explained to patient's children that I wasn't sure but that I would look through the chart to find out. They were also asking if patient had requested to be intubated, and I told them that to the best of my knowledge patient had requested to be intubated. Son and daughter at bedside, door closed, curtain open. Curtain had previously been pulled shut, but I explained to [place] and [name] that it needed be be at least partially open due to patient being on ventilator and sedated. Danny verbalized understanding. Dannielle Huh and [name] are going to facetime patient's other children.

## 2021-02-03 DEATH — deceased

## 2021-02-04 ENCOUNTER — Inpatient Hospital Stay: Payer: Medicare Other

## 2021-02-04 ENCOUNTER — Inpatient Hospital Stay (HOSPITAL_COMMUNITY)
Admit: 2021-02-04 | Discharge: 2021-02-04 | Disposition: A | Discharge status: Home / Self Care | Payer: Medicare Other | Attending: Internal Medicine | Admitting: Internal Medicine

## 2021-02-04 DIAGNOSIS — J9601 Acute respiratory failure with hypoxia: Secondary | ICD-10-CM

## 2021-02-04 DIAGNOSIS — R0603 Acute respiratory distress: Secondary | ICD-10-CM

## 2021-02-04 DIAGNOSIS — F17200 Nicotine dependence, unspecified, uncomplicated: Secondary | ICD-10-CM | POA: Diagnosis not present

## 2021-02-04 DIAGNOSIS — J441 Chronic obstructive pulmonary disease with (acute) exacerbation: Secondary | ICD-10-CM | POA: Diagnosis not present

## 2021-02-04 LAB — BLOOD GAS, ARTERIAL
Acid-base deficit: 3.6 mmol/L — ABNORMAL HIGH (ref 0.0–2.0)
Acid-base deficit: 3.7 mmol/L — ABNORMAL HIGH (ref 0.0–2.0)
Bicarbonate: 21.5 mmol/L (ref 20.0–28.0)
Bicarbonate: 23.8 mmol/L (ref 20.0–28.0)
FIO2: 40
FIO2: 0.3
MECHVT: 450 mL
MECHVT: 450 mL
Mechanical Rate: 20
O2 Saturation: 99.1 %
O2 Saturation: 89.3 %
PEEP: 5 cmH2O
PEEP: 5 cmH2O
Patient temperature: 37
Patient temperature: 37
RATE: 18 resp/min
pCO2 arterial: 38 mmHg (ref 32.0–48.0)
pCO2 arterial: 53 mmHg — ABNORMAL HIGH (ref 32.0–48.0)
pH, Arterial: 7.36 (ref 7.350–7.450)
pH, Arterial: 7.26 — ABNORMAL LOW (ref 7.350–7.450)
pO2, Arterial: 139 mmHg — ABNORMAL HIGH (ref 83.0–108.0)
pO2, Arterial: 66 mmHg — ABNORMAL LOW (ref 83.0–108.0)

## 2021-02-04 LAB — BASIC METABOLIC PANEL
Anion gap: 8 (ref 5–15)
BUN: 31 mg/dL — ABNORMAL HIGH (ref 8–23)
CO2: 23 mmol/L (ref 22–32)
Calcium: 8.3 mg/dL — ABNORMAL LOW (ref 8.9–10.3)
Chloride: 105 mmol/L (ref 98–111)
Creatinine, Ser: 0.86 mg/dL (ref 0.44–1.00)
GFR, Estimated: >60 mL/min (ref >60)
Glucose, Bld: 138 mg/dL — ABNORMAL HIGH (ref 70–99)
Potassium: 5.2 mmol/L — ABNORMAL HIGH (ref 3.5–5.1)
Sodium: 136 mmol/L (ref 135–145)

## 2021-02-04 LAB — GLUCOSE, CAPILLARY
Glucose-Capillary: 142 mg/dL — ABNORMAL HIGH (ref 70–99)
Glucose-Capillary: 154 mg/dL — ABNORMAL HIGH (ref 70–99)
Glucose-Capillary: 122 mg/dL — ABNORMAL HIGH (ref 70–99)
Glucose-Capillary: 111 mg/dL — ABNORMAL HIGH (ref 70–99)
Glucose-Capillary: 131 mg/dL — ABNORMAL HIGH (ref 70–99)
Glucose-Capillary: 140 mg/dL — ABNORMAL HIGH (ref 70–99)

## 2021-02-04 LAB — PROCALCITONIN: Procalcitonin: <0.1 ng/mL

## 2021-02-04 LAB — RENAL FUNCTION PANEL
Albumin: 3.6 g/dL (ref 3.5–5.0)
Anion gap: 7 (ref 5–15)
BUN: 33 mg/dL — ABNORMAL HIGH (ref 8–23)
CO2: 22 mmol/L (ref 22–32)
Calcium: 8.5 mg/dL — ABNORMAL LOW (ref 8.9–10.3)
Chloride: 103 mmol/L (ref 98–111)
Creatinine, Ser: 1.08 mg/dL — ABNORMAL HIGH (ref 0.44–1.00)
GFR, Estimated: 56 mL/min — ABNORMAL LOW (ref >60)
Glucose, Bld: 144 mg/dL — ABNORMAL HIGH (ref 70–99)
Phosphorus: 3.2 mg/dL (ref 2.5–4.6)
Potassium: 5.7 mmol/L — ABNORMAL HIGH (ref 3.5–5.1)
Sodium: 132 mmol/L — ABNORMAL LOW (ref 135–145)

## 2021-02-04 LAB — CBC WITH DIFFERENTIAL/PLATELET
Abs Immature Granulocytes: 0.2 10*3/uL — ABNORMAL HIGH (ref 0.00–0.07)
Basophils Absolute: 0 10*3/uL (ref 0.0–0.1)
Basophils Relative: 0 %
Eosinophils Absolute: 0 10*3/uL (ref 0.0–0.5)
Eosinophils Relative: 0 %
HCT: 34.2 % — ABNORMAL LOW (ref 36.0–46.0)
Hemoglobin: 11 g/dL — ABNORMAL LOW (ref 12.0–15.0)
Immature Granulocytes: 1 %
Lymphocytes Relative: 4 %
Lymphs Abs: 0.9 10*3/uL (ref 0.7–4.0)
MCH: 31.4 pg (ref 26.0–34.0)
MCHC: 32.2 g/dL (ref 30.0–36.0)
MCV: 97.7 fL (ref 80.0–100.0)
Monocytes Absolute: 1.1 10*3/uL — ABNORMAL HIGH (ref 0.1–1.0)
Monocytes Relative: 5 %
Neutro Abs: 21.7 10*3/uL — ABNORMAL HIGH (ref 1.7–7.7)
Neutrophils Relative %: 90 %
Platelets: 266 10*3/uL (ref 150–400)
RBC: 3.5 MIL/uL — ABNORMAL LOW (ref 3.87–5.11)
RDW: 14.5 % (ref 11.5–15.5)
WBC: 23.9 10*3/uL — ABNORMAL HIGH (ref 4.0–10.5)
nRBC: 0 % (ref 0.0–0.2)

## 2021-02-04 LAB — POTASSIUM
Potassium: 5.4 mmol/L — ABNORMAL HIGH (ref 3.5–5.1)
Potassium: 5.5 mmol/L — ABNORMAL HIGH (ref 3.5–5.1)

## 2021-02-04 LAB — ECHOCARDIOGRAM COMPLETE
Height: 64 in
S' Lateral: 2.18 cm
Weight: 2994.73 oz

## 2021-02-04 LAB — URINE CULTURE: Culture: NO GROWTH

## 2021-02-04 LAB — MAGNESIUM: Magnesium: 2.2 mg/dL (ref 1.7–2.4)

## 2021-02-04 MED ORDER — BUDESONIDE 0.5 MG/2ML IN SUSP
0.5000 mg | Freq: Two times a day (BID) | RESPIRATORY_TRACT | Status: DC
  Administered 2021-02-04 – 2021-02-08 (×8): 0.5 mg via RESPIRATORY_TRACT
  Filled 2021-02-04 (×8): qty 2

## 2021-02-04 MED ORDER — SODIUM BICARBONATE 8.4 % IV SOLN
INTRAVENOUS | Status: AC
  Administered 2021-02-04: 50 meq via INTRAVENOUS
  Filled 2021-02-04: qty 50

## 2021-02-04 MED ORDER — ALBUTEROL SULFATE (2.5 MG/3ML) 0.083% IN NEBU
2.5000 mg | INHALATION_SOLUTION | Freq: Once | RESPIRATORY_TRACT | Status: AC
  Administered 2021-02-04: 2.5 mg via RESPIRATORY_TRACT
  Filled 2021-02-04: qty 3

## 2021-02-04 MED ORDER — IPRATROPIUM-ALBUTEROL 0.5-2.5 (3) MG/3ML IN SOLN
RESPIRATORY_TRACT | Status: AC
  Filled 2021-02-04: qty 6

## 2021-02-04 MED ORDER — SODIUM BICARBONATE 8.4 % IV SOLN: 50.0000 meq | Freq: Once | INTRAVENOUS | Status: AC

## 2021-02-04 MED ORDER — INSULIN ASPART 100 UNIT/ML IV SOLN
10.0000 [IU] | Freq: Once | INTRAVENOUS | Status: AC
  Administered 2021-02-04: 10 [IU] via INTRAVENOUS
  Filled 2021-02-04: qty 0.1

## 2021-02-04 MED ORDER — ATORVASTATIN CALCIUM 20 MG PO TABS
80.0000 mg | ORAL_TABLET | Freq: Every day | ORAL | Status: DC
  Administered 2021-02-04 – 2021-02-08 (×5): 80 mg
  Filled 2021-02-04 (×5): qty 4

## 2021-02-04 MED ORDER — ADULT MULTIVITAMIN LIQUID CH
15.0000 mL | Freq: Every day | ORAL | Status: DC
  Administered 2021-02-04 – 2021-02-08 (×5): 15 mL
  Filled 2021-02-04 (×6): qty 15

## 2021-02-04 MED ORDER — VITAL HIGH PROTEIN PO LIQD
1000.0000 mL | ORAL | Status: DC
  Administered 2021-02-04: 1000 mL

## 2021-02-04 MED ORDER — FREE WATER
30.0000 mL | Status: DC
  Administered 2021-02-04 – 2021-02-05 (×6): 30 mL

## 2021-02-04 MED ORDER — PROSOURCE TF PO LIQD
45.0000 mL | Freq: Every day | ORAL | Status: DC
  Administered 2021-02-05: 45 mL
  Filled 2021-02-04: qty 45

## 2021-02-04 MED ORDER — INSULIN ASPART 100 UNIT/ML IV SOLN
10.0000 [IU] | Freq: Once | INTRAVENOUS | Status: AC
  Administered 2021-02-04: 10 [IU] via INTRAVENOUS
  Filled 2021-02-04 (×2): qty 0.1

## 2021-02-04 MED ORDER — ASPIRIN 81 MG PO CHEW
81.0000 mg | CHEWABLE_TABLET | Freq: Every day | ORAL | Status: DC
  Administered 2021-02-04 – 2021-02-08 (×5): 81 mg
  Filled 2021-02-04 (×5): qty 1

## 2021-02-04 MED ORDER — IPRATROPIUM-ALBUTEROL 0.5-2.5 (3) MG/3ML IN SOLN
3.0000 mL | RESPIRATORY_TRACT | Status: DC
  Administered 2021-02-04 – 2021-02-05 (×7): 3 mL via RESPIRATORY_TRACT
  Filled 2021-02-04 (×7): qty 3

## 2021-02-04 MED ORDER — MIDAZOLAM HCL 2 MG/2ML IJ SOLN
2.0000 mg | Freq: Once | INTRAMUSCULAR | Status: AC
  Administered 2021-02-04: 2 mg via INTRAVENOUS
  Filled 2021-02-04: qty 2

## 2021-02-04 MED ORDER — DEXTROSE 50 % IV SOLN
12.5000 g | Freq: Once | INTRAVENOUS | Status: AC
  Administered 2021-02-04: 12.5 g via INTRAVENOUS
  Filled 2021-02-04: qty 50

## 2021-02-04 MED ORDER — IPRATROPIUM-ALBUTEROL 0.5-2.5 (3) MG/3ML IN SOLN
6.0000 mL | Freq: Once | RESPIRATORY_TRACT | Status: AC
  Administered 2021-02-04: 6 mL via RESPIRATORY_TRACT

## 2021-02-04 MED ORDER — DEXTROSE 50 % IV SOLN
1.0000 | Freq: Once | INTRAVENOUS | Status: AC
  Administered 2021-02-04: 50 mL via INTRAVENOUS
  Filled 2021-02-04: qty 50

## 2021-02-04 MED ORDER — VECURONIUM BROMIDE 10 MG IV SOLR
10.0000 mg | Freq: Once | INTRAVENOUS | Status: AC
  Administered 2021-02-04: 10 mg via INTRAVENOUS
  Filled 2021-02-04: qty 10

## 2021-02-04 MED ORDER — SODIUM ZIRCONIUM CYCLOSILICATE 5 G PO PACK
10.0000 g | PACK | Freq: Once | ORAL | Status: AC
  Administered 2021-02-04: 10 g
  Filled 2021-02-04: qty 2

## 2021-02-04 MED ORDER — ARFORMOTEROL TARTRATE 15 MCG/2ML IN NEBU
15.0000 ug | INHALATION_SOLUTION | Freq: Two times a day (BID) | RESPIRATORY_TRACT | Status: DC
  Administered 2021-02-04 – 2021-02-08 (×6): 15 ug via RESPIRATORY_TRACT
  Filled 2021-02-04 (×9): qty 2

## 2021-02-04 MED ORDER — NICOTINE 14 MG/24HR TD PT24
14.0000 mg | MEDICATED_PATCH | Freq: Every day | TRANSDERMAL | Status: DC
  Administered 2021-02-04 – 2021-02-08 (×5): 14 mg via TRANSDERMAL
  Filled 2021-02-04 (×5): qty 1

## 2021-02-04 MED ORDER — SODIUM CHLORIDE 0.9 % IV BOLUS: 1000.0000 mL | Freq: Once | INTRAVENOUS | Status: DC

## 2021-02-04 NOTE — Progress Notes (Signed)
Patient currently on SBT with 2 daughters at bedside. Patient has elevated HR, BP, and increased work of breathing. NP at bedside to update 2 daughters. Decision made to re-sedate and return patient to [facility]. NP explained to family and explained weaning trial would be attempted again tomorrow.  RT flipped patient back and patient re-sedated see MAR and flowsheets for details.

## 2021-02-04 NOTE — Plan of Care (Signed)

## 2021-02-04 NOTE — Progress Notes (Signed)
NAME:  Alicia Rivera, MRN:  [id number], DOB:  [birth year], LOS: 2 ADMISSION DATE:  01/08/2021, CONSULTATION DATE: 01/26/2021 REFERRING MD: Dr. Damita Dunnings, CHIEF COMPLAINT: Shortness of breath  History of Present Illness:  66 year old female with a known past medical history of COPD, anxiety & current smoker presenting to [facility] ED with several day history of wheezing and shortness of breath that became acutely worse overnight on 01/17/2021.  Per ED documentation she reported using her inhalers at home and denied any changes to her chronic cough, but did endorse chest tightness without chest pain.  She confirmed she is still smoking cigarettes.    Pertinent  Medical History  Current smoker COPD -not on home oxygen Anxiety Hypertension Hyperlipidemia Ischemic left thalamic stroke -10/2020 TIA   Events: Including procedures, antibiotic start and stop dates in addition to other pertinent events   . 01/29/2021-admitted with TRH for COPD exacerbation requiring acute oxygen . 02/02/2021- urgently transferred to ICU due to respiratory distress requiring emergent RSI and mechanical ventilation . 02/03/2021-CTA Chest revealed subsegmental and distal lower lung branches are obscured by motion.  Elsewhere no pulmonary embolus identified.  Chronic Emphysema. Lower lung volumes with atelectasis, but no focal infection.  Osteopenia with new or progressed compression fractures of T8 through T10, T12, and L1 since 2019 . 02/03/21- patient remains on ventilator - met with family (two people son and daughter) they were very irate and wanted patient immediately moved to [facility] to be evaluted by COPD specialist. I have explained this is very unlikely to occur and spoke with [facility] to request transfer to accommodate family. However, no beds available at [facility] . 02/04/2021-will perform WUA and attempt SBT        Objective   Blood pressure 112/61, pulse 64, temperature 99.7 F (37.6 C), temperature source Axillary,  resp. rate 18, height '5\' 4"'  (1.626 m), weight 84.9 kg, SpO2 96 %.    Vent Mode: PRVC FiO2 (%):  [40 %] 40 % Set Rate:  [20 bmp] 20 bmp Vt Set:  [450 mL] 450 mL PEEP:  [5 cmH20] 5 cmH20 Plateau Pressure:  [25 cmH20-28 cmH20] 25 cmH20   Intake/Output Summary (Last 24 hours) at 02/04/2021 5056 Last data filed at 02/04/2021 0600 Gross per 24 hour  Intake 1830.46 ml  Output 1235 ml  Net 595.46 ml   Filed Weights   01/05/2021 2259 02/02/21 2345 02/04/21 0325  Weight: 81.6 kg 84.4 kg 84.9 kg    Examination: General: Acutely ill appearing female, mechanically intubated NAD HEENT: MM pink/moist, anicteric, atraumatic, neck supple Neuro: opening eyes spontaneously, not following commands weaning sedation off, PERRL CV: Sinus rhythm, S1S2, no M/R/G, 2+ radial/1+ distal pulses  Pulm: diminished with faint expiratory and inspiratory wheezes throughout, even, non labored  GI: +BS x4, obese, soft, non tender, non distended Skin: Limited exam- no rashes/lesions noted Extremities: Mildly edemaotous   IMAGING     Assessment & Plan:  Acute on chronic hypoxic Respiratory Failure secondary to COPD exacerbation with possible pneumonia although low suspicion will follow respiratory culture Mechanical intubation pain/discomfort  PMHx: COPD (no home oxygen), current smoker Per chart review patient had a recent COPD exacerbation and was placed on Levaquin with a prednisone taper on 01/17/2021. - Ventilator settings: PRVC  8 mL/kg, 50% FiO2, 5 PEEP, continue ventilator support & lung protective strategies - Wean PEEP & FiO2 as tolerated, maintain SpO2 > 88% - Head of bed elevated 30 degrees, VAP protocol in place - Plateau pressures less than 30 cm  H20  - Intermittent chest x-ray & ABG PRN - Daily WUA with SBT as tolerated  - Ensure adequate pulmonary hygiene  - Trend PCT - Continue ceftriaxone & add azithromycin for COPD exacerbation - Continue iv steroids for now wean as tolerated  - Budesonide  nebs BID, bronchodilators PRN - PAD protocol in place: continue Fentanyl drip & Propofol drip wean as tolerated, Versed PRN  Hypotension secondary to sedating medications  Hx: Ischemic CVA, TIA, HLD, and HTN  -Continuous telemetry monitoring  -Hold outpatient antihypertensive for now due to hypotension  -Peripheral levophed prn to maintain map >65 -Will resume outpatient aspirin and atorvastatin   Mild acute renal failure with hyperkalemia secondary to ATN  Urinary retention  Repeat K+ level  Trend BMP  Replace electrolytes as indicated  Monitor UOP  Will assess possibly removing indwelling foley catheter 02/05/2021  Anemia without signs of acute blood loss Trend CBC Monitor for s/sx of bleeding and transfuse for hgb <7  Steroid induced hyperglycemia  CBG q4hrs  SSI   Anxiety at baseline  -Continue scheduled outpatient xanax  -Continue Seroquel nightly -Supportive care  Best practice (right click and "Reselect all SmartList Selections" daily)  Diet:  NPO, will consult dietitian to start TF's  Pain/Anxiety/Delirium protocol (if indicated): Yes (RASS goal -1) VAP protocol (if indicated): Yes DVT prophylaxis: LMWH GI prophylaxis: H2B Glucose control:  SSI No Central venous access:  N/A Arterial line:  N/A Foley: Yes  Mobility:  bed rest  PT consulted: N/A Last date of multidisciplinary goals of care discussion 02/04/2021 Code Status:  full code Disposition: ICU  Labs   CBC: Recent Labs  Lab 01/10/2021 2300 02/03/21 0414 02/04/21 0329  WBC 13.3* 28.7* 23.9*  NEUTROABS  --  25.9* 21.7*  HGB 14.6 11.3* 11.0*  HCT 42.6 34.1* 34.2*  MCV 93.4 95.8 97.7  PLT 274 291 400    Basic Metabolic Panel: Recent Labs  Lab 01/14/2021 2300 02/03/21 0414 02/04/21 0329  NA 136 131* 132*  K 3.2* 4.6 5.7*  CL 104 103 103  CO2 22 19* 22  GLUCOSE 98 175* 144*  BUN 8 19 33*  CREATININE 0.83 0.77 1.08*  CALCIUM 8.2* 8.3* 8.5*  MG 4.8* 1.9 2.2  PHOS  --  3.8 3.2    GFR: Estimated Creatinine Clearance: 53.3 mL/min (A) (by C-G formula based on SCr of 1.08 mg/dL (H)). Recent Labs  Lab 01/31/2021 2300 02/03/21 0414 02/04/21 0329  PROCALCITON <0.10 <0.10 <0.10  WBC 13.3* 28.7* 23.9*    Liver Function Tests: Recent Labs  Lab 02/03/21 0414 02/04/21 0329  AST 22  --   ALT 19  --   ALKPHOS 54  --   BILITOT 0.7  --   PROT 6.1*  --   ALBUMIN 3.4* 3.6   No results for input(s): LIPASE, AMYLASE in the last 168 hours. No results for input(s): AMMONIA in the last 168 hours.  ABG    Component Value Date/Time   PHART 7.36 02/04/2021 0451   PCO2ART 38 02/04/2021 0451   PO2ART 139 (H) 02/04/2021 0451   HCO3 21.5 02/04/2021 0451   ACIDBASEDEF 3.6 (H) 02/04/2021 0451   O2SAT 99.1 02/04/2021 0451     Coagulation Profile: No results for input(s): INR, PROTIME in the last 168 hours.  Cardiac Enzymes: No results for input(s): CKTOTAL, CKMB, CKMBINDEX, TROPONINI in the last 168 hours.  HbA1C: Hgb A1c MFr Bld  Date/Time Value Ref Range Status  02/03/2021 04:14 AM 5.7 (H) 4.8 - 5.6 %  Final    Comment:    (NOTE) Pre diabetes:          5.7%-6.4%  Diabetes:              >6.4%  Glycemic control for   <7.0% adults with diabetes     CBG: Recent Labs  Lab 02/03/21 1535 02/03/21 1929 02/03/21 2343 02/04/21 0329 02/04/21 0717  GLUCAP 127* 117* 128* 142* 154*    Review of Systems: Positives in BOLD  Unable to perform pt mechanically intubated   Past Medical History:  She,  has a past medical history of Anxiety, COPD (chronic obstructive pulmonary disease) ([place]), HTN (hypertension), Hyperlipidemia, Ischemic cerebrovascular accident (CVA) ([place]) (10/2020), Pneumonia (09/18/2017), TIA (transient ischemic attack), and Tobacco abuse.   Surgical History:   Past Surgical History:  Procedure Laterality Date  . CATARACT EXTRACTION W/PHACO Left 12/13/2019   Procedure: CATARACT EXTRACTION PHACO AND INTRAOCULAR LENS PLACEMENT ([place]) LEFT;  Surgeon:  Birder Robson, MD;  Location: [place];  Service: Ophthalmology;  Laterality: Left;  CDE 4.90 U/S 0:36.8  . CATARACT EXTRACTION W/PHACO Right 01/03/2020   Procedure: CATARACT EXTRACTION PHACO AND INTRAOCULAR LENS PLACEMENT ([facility]) RIGHT 6.01 00:44.5;  Surgeon: Birder Robson, MD;  Location: [place];  Service: Ophthalmology;  Laterality: Right;     Social History:   reports that she has been smoking cigarettes. She has smoked for the past 50.00 years. She has never used smokeless tobacco. She reports that she does not drink alcohol and does not use drugs.   Family History:  Her family history includes Emphysema in her brother; Heart disease in her father; Lung disease in her mother.   Allergies Allergies  Allergen Reactions  . Crestor [Rosuvastatin] Nausea And Vomiting    Stopped medication 07/15/20  . Other Hives    trilogy  . Trelegy Ellipta [Fluticasone-Umeclidin-Vilant] Anaphylaxis  . Sulfa Antibiotics   . Sulfasalazine Hives     Home Medications  Prior to Admission medications   Medication Sig Start Date End Date Taking? Authorizing Provider  albuterol (VENTOLIN HFA) 108 (90 Base) MCG/ACT inhaler Inhale 2 puffs into the lungs every 4 (four) hours as needed for wheezing or shortness of breath. 01/22/21  Yes Allyne Gee, MD  ALPRAZolam Duanne Moron) 0.25 MG tablet Take 1 tablet (0.25 mg total) by mouth 2 (two) times daily as needed for anxiety. 01/22/21  Yes Allyne Gee, MD  aspirin 81 MG chewable tablet Chew 81 mg by mouth daily. 01/09/21  Yes [provider]  atorvastatin (LIPITOR) 80 MG tablet Take 80 mg by mouth daily. 12/10/20  Yes [provider]  budesonide-formoterol (SYMBICORT) 160-4.5 MCG/ACT inhaler Inhale 1 puff into the lungs 2 (two) times daily. 11/19/20  Yes Luiz Ochoa, NP  DULoxetine (CYMBALTA) 30 MG capsule Take 1 capsule (30 mg total) by mouth daily. 12/10/20  Yes Luiz Ochoa, NP  ipratropium (ATROVENT) 0.02 %  nebulizer solution USE 1 VIAL VIA NEBULIZER EVERY 6 HOURS AS NEEDED FOR WHEEZING OR SHORTNESS OF BREATH Patient taking differently: Take 0.5 mg by nebulization every 6 (six) hours as needed for wheezing or shortness of breath. 06/28/20  Yes Lavera Guise, MD  losartan-hydrochlorothiazide (HYZAAR) 100-12.5 MG tablet Take 1 tablet by mouth daily.   Yes [provider]  montelukast (SINGULAIR) 10 MG tablet TAKE 1 TABLET(10 MG) BY MOUTH AT BEDTIME Patient taking differently: Take 10 mg by mouth daily. 10/30/20  Yes Luiz Ochoa, NP  QUEtiapine (SEROQUEL) 25 MG tablet Take 1  tablet (25 mg total) by mouth at bedtime. 11/19/20  Yes Luiz Ochoa, NP  roflumilast (DALIRESP) 500 MCG TABS tablet TAKE 1 TABLET(500 MCG) BY MOUTH DAILY Patient taking differently: Take 500 mcg by mouth daily. 10/30/20  Yes Luiz Ochoa, NP  tiotropium (SPIRIVA) 18 MCG inhalation capsule Place 1 capsule (18 mcg total) into inhaler and inhale daily. 11/19/20  Yes Luiz Ochoa, NP  cetirizine (ZYRTEC) 10 MG tablet TAKE 1 TABLET(10 MG) BY MOUTH DAILY Patient not taking: No sig reported 12/05/19   Kendell Bane, NP  levofloxacin (LEVAQUIN) 500 MG tablet Take 1 tablet (500 mg total) by mouth daily. Patient not taking: No sig reported 01/17/21   Luiz Ochoa, NP  losartan (COZAAR) 25 MG tablet Take 1 tablet (25 mg total) by mouth daily. Patient not taking: No sig reported 11/19/20   Luiz Ochoa, NP  predniSONE (DELTASONE) 10 MG tablet Take 1 tablet three times a day with a meal for three for three days, take 1 tablet by twice daily with a meal for 3 days, take 1 tablet once daily with a meal for 3 days Patient not taking: No sig reported 01/17/21   Luiz Ochoa, NP    -Updated pts daughters Andee Poles and [place] who are both at bedside regarding plan of care and all questions were answered.  Both daughters were satisfied with plan of care.  Marda Stalker, [place] Pager [phone]  (please enter 7 digits) PCCM Consult Pager [phone] (please enter 7 digits)

## 2021-02-04 NOTE — Progress Notes (Signed)
1900: Received report from [place], Charity fundraiser. Patient received a one time dose of Vecuronium at the time of bedside shift report to promote ventilator compliance. Patient stacking breaths, sats down to 80%, tachycardic. Patient appears more comfortable and peak pressures WNL post medication administration. Unable to illicit  Basic neuro reflexes due to medication. VSS, BP soft, Levophed on standby. 2000: Patient son Dannielle Huh called for an update. Advised that patient is more sedated at this time to improve ventilator compliance.  2200: Repeat ABG and BMP collected, results improved from previous. Physician notified. No new orders at this time. 0000: No change in head to toe assessment. Patient remains deeply sedated.  0300: Minimal sedation weaning attempted  to obtain a RASS score, prepare for SBT this AM, and complete full neuro assessment. Pt has been deeply sedated since shift change. Even with minimal decrease, pt stacking breaths, increased WOB, Peak pressures elevated to the upper 40s, dysync.w/vent, low tidal volumes. Sedation placed back on max settings and PRN Versed given. Sats remain stable thoughout event but ETCO2 went from 30s to 50s and patient appeared to be struggling. eLink contacted and orders received. RT at bedside. Patient stabilized, slight improvement of breath sounds. Will continue to monitor.   0400: No assessment changes at this time. Labs collected.  0700: Report given to Asher Muir, RN and patient care transferred.

## 2021-02-04 NOTE — Progress Notes (Signed)
Nutrition Follow-up  DOCUMENTATION CODES:   Obesity unspecified  INTERVENTION:   Initiate Vital HP @ 62ml/hr + ProSource TF 34ml daily via tube   Free water flushes 84ml q4 hours to maintain tube patency   Propofol: 9.79 ml/hr- provides 258kcal/day   Regimen provides 1240kcal/day, 116g/day protein and 1133ml/day free water (with propofol provides 1498kcal/day)   Liquid MVI daily via tube   NUTRITION DIAGNOSIS:   Inadequate oral intake related to inability to eat as evidenced by NPO status.  GOAL:   Provide needs based on ASPEN/SCCM guidelines  MONITOR:   Vent status,Labs,Weight trends,I & O's  REASON FOR ASSESSMENT:   Consult Enteral/tube feeding initiation and management  ASSESSMENT:   65 year old female with a known past medical history of COPD, anxiety & current smoker presenting to [facility] ED with several day history of wheezing and shortness of breath that became acutely worse overnight on 01/16/2021.   Pt sedated and ventilated. OGT in place. Plan is to start tube feeds today as pt failed weaning trials.   Medications reviewed and include: aspirin, colace, lovenox, insulin, solu-medrol, nicotine, miralax, azithromycin, ceftriaxone, pepcid, levophed   Labs reviewed: Na 132(L), K 5.4(H), BUN 33(H), creat 1.08(H), P 3.2 wnl, Mg 2.2 wnl Wbc- 23.9(H) cbgs- 142, 154, 122 x 24 hrs AIC 5.7- 5/1  Patient is currently intubated on ventilator support MV: 7.0 L/min Temp (24hrs), Avg:98.7 F (37.1 C), Min:97.9 F (36.6 C), Max:99.7 F (37.6 C)  Propofol: 9.79 ml/hr- provides 258kcal/day   MAP- >79mmHg  UOP- 1138ml/day   NUTRITION - FOCUSED PHYSICAL EXAM:  Flowsheet Row Most Recent Value  Orbital Region No depletion  Upper Arm Region No depletion  Thoracic and Lumbar Region No depletion  Buccal Region No depletion  Temple Region Moderate depletion  Clavicle Bone Region No depletion  Clavicle and Acromion Bone Region No depletion  Scapular Bone Region No  depletion  Dorsal Hand No depletion  Patellar Region Moderate depletion  Anterior Thigh Region Moderate depletion  Posterior Calf Region Moderate depletion  Edema (RD Assessment) Mild  Hair Reviewed  Eyes Reviewed  Mouth Reviewed  Skin Reviewed  Nails Reviewed     Diet Order:   Diet Order            Diet NPO time specified  Diet effective now                EDUCATION NEEDS:   No education needs have been identified at this time  Skin:  Skin Assessment: Reviewed RN Assessment  Last BM:  4/30  Height:   Ht Readings from Last 1 Encounters:  02/02/21 5\' 4"  (1.626 m)    Weight:   Wt Readings from Last 1 Encounters:  02/04/21 84.9 kg   BMI:  Body mass index is 32.13 kg/m.  Estimated Nutritional Needs:   Kcal:  [phone]  Protein:  >110g/day  Fluid:  1.4-1.6L/day  04/06/21 MS, RD, LDN Please refer to [facility] for RD and/or RD on-call/weekend/after hours pager

## 2021-02-04 NOTE — Progress Notes (Signed)
*  PRELIMINARY RESULTS* Echocardiogram 2D Echocardiogram has been performed.  Alicia Rivera 02/04/2021, 2:35 PM

## 2021-02-05 DIAGNOSIS — J441 Chronic obstructive pulmonary disease with (acute) exacerbation: Secondary | ICD-10-CM | POA: Diagnosis not present

## 2021-02-05 DIAGNOSIS — J9601 Acute respiratory failure with hypoxia: Secondary | ICD-10-CM | POA: Diagnosis not present

## 2021-02-05 DIAGNOSIS — F17200 Nicotine dependence, unspecified, uncomplicated: Secondary | ICD-10-CM | POA: Diagnosis not present

## 2021-02-05 LAB — CBC WITH DIFFERENTIAL/PLATELET
Abs Immature Granulocytes: 0.18 10*3/uL — ABNORMAL HIGH (ref 0.00–0.07)
Basophils Absolute: 0 10*3/uL (ref 0.0–0.1)
Basophils Relative: 0 %
Eosinophils Absolute: 0 10*3/uL (ref 0.0–0.5)
Eosinophils Relative: 0 %
HCT: 32.1 % — ABNORMAL LOW (ref 36.0–46.0)
Hemoglobin: 10.3 g/dL — ABNORMAL LOW (ref 12.0–15.0)
Immature Granulocytes: 1 %
Lymphocytes Relative: 5 %
Lymphs Abs: 0.7 10*3/uL (ref 0.7–4.0)
MCH: 31.7 pg (ref 26.0–34.0)
MCHC: 32.1 g/dL (ref 30.0–36.0)
MCV: 98.8 fL (ref 80.0–100.0)
Monocytes Absolute: 1.5 10*3/uL — ABNORMAL HIGH (ref 0.1–1.0)
Monocytes Relative: 9 %
Neutro Abs: 13.6 10*3/uL — ABNORMAL HIGH (ref 1.7–7.7)
Neutrophils Relative %: 85 %
Platelets: 215 10*3/uL (ref 150–400)
RBC: 3.25 MIL/uL — ABNORMAL LOW (ref 3.87–5.11)
RDW: 14.4 % (ref 11.5–15.5)
WBC: 16 10*3/uL — ABNORMAL HIGH (ref 4.0–10.5)
nRBC: 0 % (ref 0.0–0.2)

## 2021-02-05 LAB — GLUCOSE, CAPILLARY
Glucose-Capillary: 140 mg/dL — ABNORMAL HIGH (ref 70–99)
Glucose-Capillary: 159 mg/dL — ABNORMAL HIGH (ref 70–99)
Glucose-Capillary: 127 mg/dL — ABNORMAL HIGH (ref 70–99)
Glucose-Capillary: 135 mg/dL — ABNORMAL HIGH (ref 70–99)
Glucose-Capillary: 122 mg/dL — ABNORMAL HIGH (ref 70–99)
Glucose-Capillary: 126 mg/dL — ABNORMAL HIGH (ref 70–99)
Glucose-Capillary: 142 mg/dL — ABNORMAL HIGH (ref 70–99)

## 2021-02-05 LAB — RENAL FUNCTION PANEL
Albumin: 3.2 g/dL — ABNORMAL LOW (ref 3.5–5.0)
Anion gap: 5 (ref 5–15)
BUN: 34 mg/dL — ABNORMAL HIGH (ref 8–23)
CO2: 26 mmol/L (ref 22–32)
Calcium: 8.1 mg/dL — ABNORMAL LOW (ref 8.9–10.3)
Chloride: 105 mmol/L (ref 98–111)
Creatinine, Ser: 0.85 mg/dL (ref 0.44–1.00)
GFR, Estimated: >60 mL/min (ref >60)
Glucose, Bld: 140 mg/dL — ABNORMAL HIGH (ref 70–99)
Phosphorus: 3.4 mg/dL (ref 2.5–4.6)
Potassium: 5.2 mmol/L — ABNORMAL HIGH (ref 3.5–5.1)
Sodium: 136 mmol/L (ref 135–145)

## 2021-02-05 LAB — CULTURE, RESPIRATORY W GRAM STAIN
Culture: NORMAL
Gram Stain: NONE SEEN

## 2021-02-05 LAB — MAGNESIUM: Magnesium: 2.7 mg/dL — ABNORMAL HIGH (ref 1.7–2.4)

## 2021-02-05 LAB — PROCALCITONIN: Procalcitonin: <0.1 ng/mL

## 2021-02-05 MED ORDER — VECURONIUM BROMIDE 10 MG IV SOLR
INTRAVENOUS | Status: AC
  Administered 2021-02-05: 10 mg via INTRAVENOUS
  Filled 2021-02-05: qty 10

## 2021-02-05 MED ORDER — PROSOURCE TF PO LIQD
45.0000 mL | Freq: Four times a day (QID) | ORAL | Status: DC
  Administered 2021-02-05 – 2021-02-08 (×11): 45 mL
  Filled 2021-02-05 (×15): qty 45

## 2021-02-05 MED ORDER — FREE WATER
100.0000 mL | Status: DC
  Administered 2021-02-05 – 2021-02-08 (×18): 100 mL

## 2021-02-05 MED ORDER — VECURONIUM BROMIDE 10 MG IV SOLR
10.0000 mg | Freq: Once | INTRAVENOUS | Status: AC
  Administered 2021-02-05: 10 mg via INTRAVENOUS

## 2021-02-05 MED ORDER — ALBUTEROL SULFATE (2.5 MG/3ML) 0.083% IN NEBU
INHALATION_SOLUTION | RESPIRATORY_TRACT | Status: AC
  Administered 2021-02-05: 10 mg via RESPIRATORY_TRACT
  Filled 2021-02-05: qty 12

## 2021-02-05 MED ORDER — ALBUTEROL (5 MG/ML) CONTINUOUS INHALATION SOLN
10.0000 mg/h | INHALATION_SOLUTION | RESPIRATORY_TRACT | Status: DC
  Filled 2021-02-05: qty 20

## 2021-02-05 MED ORDER — IPRATROPIUM-ALBUTEROL 0.5-2.5 (3) MG/3ML IN SOLN: 3.0000 mL | RESPIRATORY_TRACT | Status: DC

## 2021-02-05 MED ORDER — VECURONIUM BROMIDE 10 MG IV SOLR: 10.0000 mg | Freq: Once | INTRAVENOUS | Status: AC

## 2021-02-05 MED ORDER — SODIUM ZIRCONIUM CYCLOSILICATE 5 G PO PACK
10.0000 g | PACK | Freq: Once | ORAL | Status: AC
  Administered 2021-02-05: 10 g
  Filled 2021-02-05: qty 2

## 2021-02-05 MED ORDER — VITAL HIGH PROTEIN PO LIQD
1000.0000 mL | ORAL | Status: DC
  Administered 2021-02-05 – 2021-02-07 (×3): 1000 mL

## 2021-02-05 MED ORDER — METHYLPREDNISOLONE SODIUM SUCC 40 MG IJ SOLR
40.0000 mg | Freq: Four times a day (QID) | INTRAMUSCULAR | Status: DC
  Administered 2021-02-05 – 2021-02-08 (×13): 40 mg via INTRAVENOUS
  Filled 2021-02-05 (×13): qty 1

## 2021-02-05 MED ORDER — IPRATROPIUM-ALBUTEROL 0.5-2.5 (3) MG/3ML IN SOLN
3.0000 mL | Freq: Four times a day (QID) | RESPIRATORY_TRACT | Status: DC
  Administered 2021-02-05 – 2021-02-06 (×4): 3 mL via RESPIRATORY_TRACT
  Filled 2021-02-05 (×4): qty 3

## 2021-02-05 NOTE — Progress Notes (Signed)
Nutrition Brief Follow-up Note   INTERVENTION:   Decrease Vital HP to 34ml/hr + ProSource TF 2ml QID via tube   Free water flushes q4 hours to maintain tube patency   Propofol: 19.58 ml/hr- provides 516kcal/day   Regimen provides 1516kcal/day, 118g/day protein and 1363ml/day free water  Liquid MVI daily via tube   ASSESSMENT:   65 year old female with a known past medical history of COPD, anxiety & current smoker presenting to [facility] ED with several day history of wheezing and shortness of breath that became acutely worse overnight on 01/19/2021.   Pt tolerating tube feeds well at goal rate. RD will adjust tube feeds in setting of propofol rate increase.   Estimated Nutritional Needs:   Kcal:  [phone]  Protein:  >110g/day  Fluid:  1.4-1.6L/day  Betsey Holiday MS, RD, LDN Please refer to [facility] for RD and/or RD on-call/weekend/after hours pager

## 2021-02-05 NOTE — Progress Notes (Signed)
eLink Physician-Brief Progress Note Patient Name: Alicia Rivera DOB: 1956-08-08 MRN: [id number]   Date of Service  02/05/2021  HPI/Events of Note  Notified of vent desynchrony despite Propofol 50, Fentanyl 200 and Versed push RASS -4 On VAC 17/440/30%/5 PEEP Ongoing nebulization still with tight air entry  eICU Interventions  Was given vecuronium earlier, will give another dose. May require continuous NMB.     Intervention Category Intermediate Interventions: Respiratory distress - evaluation and management  Darl Pikes 02/05/2021, 3:29 AM

## 2021-02-05 NOTE — Plan of Care (Signed)
Neuro: not fully sedated on maxed ordered doses of sedation, MD aware, multiple boluses required with little relief Resp: dyssynchronous on the ventilator throughout the day, could benefit from scheduled paralytic CV: afebrile, vital signs stable, edema throughout GIGU: foley in place, tolerating tube feeds well, no BM today Skin: clean dry, flaky and intact Social: 3 of the patients children and sister visited today. The family met with Dr. Tacy Learn to discuss plan of care and goals.   Problem: Education: Goal: Knowledge of General Education information will improve Description: Including pain rating scale, medication(s)/side effects and non-pharmacologic comfort measures Outcome: Not Progressing   Problem: Health Behavior/Discharge Planning: Goal: Ability to manage health-related needs will improve Outcome: Not Progressing   Problem: Clinical Measurements: Goal: Ability to maintain clinical measurements within normal limits will improve Outcome: Not Progressing Goal: Will remain free from infection Outcome: Not Progressing Goal: Diagnostic test results will improve Outcome: Not Progressing Goal: Respiratory complications will improve Outcome: Not Progressing Goal: Cardiovascular complication will be avoided Outcome: Not Progressing   Problem: Activity: Goal: Risk for activity intolerance will decrease Outcome: Not Progressing   Problem: Nutrition: Goal: Adequate nutrition will be maintained Outcome: Not Progressing   Problem: Coping: Goal: Level of anxiety will decrease Outcome: Not Progressing   Problem: Elimination: Goal: Will not experience complications related to bowel motility Outcome: Not Progressing Goal: Will not experience complications related to urinary retention Outcome: Not Progressing   Problem: Pain Managment: Goal: General experience of comfort will improve Outcome: Not Progressing   Problem: Safety: Goal: Ability to remain free from injury will  improve Outcome: Not Progressing   Problem: Skin Integrity: Goal: Risk for impaired skin integrity will decrease Outcome: Not Progressing   Problem: Activity: Goal: Ability to tolerate increased activity will improve Outcome: Not Progressing   Problem: Respiratory: Goal: Ability to maintain a clear airway and adequate ventilation will improve Outcome: Not Progressing   Problem: Role Relationship: Goal: Method of communication will improve Outcome: Not Progressing

## 2021-02-05 NOTE — Progress Notes (Signed)
NAME:  Alicia Rivera, MRN:  [id number], DOB:  10-16-1955, LOS: 3 ADMISSION DATE:  01/26/2021, CONSULTATION DATE: 01/06/2021 REFERRING MD: Dr. Damita Dunnings, CHIEF COMPLAINT: Shortness of breath  History of Present Illness:  65 year old female with a known past medical history of COPD, anxiety & current smoker presenting to [facility] ED with several day history of wheezing and shortness of breath that became acutely worse overnight on 01/22/2021.  Per ED documentation she reported using her inhalers at home and denied any changes to her chronic cough, but did endorse chest tightness without chest pain.  She confirmed she is still smoking cigarettes.     Events: Including procedures, antibiotic start and stop dates in addition to other pertinent events   . 01/06/2021-admitted with TRH for COPD exacerbation requiring acute oxygen . 02/02/2021- urgently transferred to ICU due to respiratory distress requiring emergent RSI and mechanical ventilation . 02/03/2021-CTA Chest revealed subsegmental and distal lower lung branches are obscured by motion.  Elsewhere no pulmonary embolus identified.  Chronic Emphysema. Lower lung volumes with atelectasis, but no focal infection.  Osteopenia with new or progressed compression fractures of T8 through T10, T12, and L1 since 2019 . 02/03/21- patient remains on ventilator - met with family (two people son and daughter) they were very irate and wanted patient immediately moved to [facility] to be evaluted by COPD specialist. I have explained this is very unlikely to occur and spoke with [facility] to request transfer to accommodate family. However, no beds available at [facility] . 02/04/2021-will perform WUA and attempt SBT  . Continue to double stack on the ventilator and generating auto PEEP, placed on continuous albuterol  Interim history/subjective  Patient continued to struggle in terms of synchronization with ventilator Requiring 2 doses of neuromuscular blocker overnight   Objective    Blood pressure 103/63, pulse 66, temperature 97.9 F (36.6 C), temperature source Axillary, resp. rate 15, height '5\' 4"'  (1.626 m), weight 84.9 kg, SpO2 97 %.    Vent Mode: PRVC FiO2 (%):  [30 %] 30 % Set Rate:  [18 bmp-24 bmp] 24 bmp Vt Set:  [400 mL-450 mL] 400 mL PEEP:  [5 cmH20] 5 cmH20 Plateau Pressure:  [24 OJJ00-93 cmH20] 28 cmH20   Intake/Output Summary (Last 24 hours) at 02/05/2021 0857 Last data filed at 02/05/2021 0500 Gross per 24 hour  Intake 3008.74 ml  Output 1590 ml  Net 1418.74 ml   Filed Weights   01/20/2021 2259 02/02/21 2345 02/04/21 0325  Weight: 81.6 kg 84.4 kg 84.9 kg    Examination: General: Acutely ill appearing female, lying in the bed, orally intubated HEENT: MM pink/moist, anicteric, atraumatic, neck supple Neuro: Deeply sedated, not following commands  CV: Sinus rhythm, S1S2, no M/R/G, 2+ radial/1+ distal pulses  Pulm: Bilateral expiratory wheezes, reduced air entry all over.  No crackles  GI: +BS x4, obese, soft, non tender, non distended Skin: Limited exam- no rashes/lesions noted Extremities: Mildly edemaotous   IMAGING    Assessment & Plan:  Acute on chronic hypoxic Respiratory Failure secondary to COPD exacerbation  Patient continued to have bilateral expiratory wheezes, she is double stacking with auto PEEP Increased PEEP to 12, currently on 50% FiO2 Decrease tidal volume to 7 cc/kg to decrease peak pressures Started on continuous albuterol nebulization Continue IV steroid 40 mg every 6 hours  Head of bed elevated 30 degrees, VAP protocol in place Plateau pressures is less than 30 cm H20  Continue ceftriaxone & add azithromycin for COPD exacerbation  Budesonide nebs  BID and Brovana Continue sedation with propofol and fentanyl   Acute kidney injury with hyperkalemia secondary to ATN  Urinary retention  Serum creatinine improved back to baseline Serum potassium is stable around 5-5 0.5  repeat K+ level  Trend BMP  Replace  electrolytes as indicated   Anemia of critical illness Trend CBC Monitor for s/sx of bleeding and transfuse for hgb <7  Steroid induced hyperglycemia  CBG q4hrs  SSI   Anxiety at baseline  -Continue scheduled outpatient xanax  -Continue Seroquel nightly -Supportive care  Best practice (right click and "Reselect all SmartList Selections" daily)  Diet:  NPO, feeds Pain/Anxiety/Delirium protocol (if indicated): Yes RASS goal -3 VAP protocol (if indicated): Yes DVT prophylaxis: LMWH GI prophylaxis: H2B Glucose control:  SSI No Central venous access:  N/A Arterial line:  N/A Foley: Yes, discontinue today Mobility:  bed rest  PT consulted: N/A Last date of multidisciplinary goals of care discussion 02/04/2021.  Patient's family was updated at bedside Code Status:  full code Disposition: ICU  Labs   CBC: Recent Labs  Lab 01/04/2021 2300 02/03/21 0414 02/04/21 0329 02/05/21 0436  WBC 13.3* 28.7* 23.9* 16.0*  NEUTROABS  --  25.9* 21.7* 13.6*  HGB 14.6 11.3* 11.0* 10.3*  HCT 42.6 34.1* 34.2* 32.1*  MCV 93.4 95.8 97.7 98.8  PLT 274 291 266 917    Basic Metabolic Panel: Recent Labs  Lab 01/18/2021 2300 02/03/21 0414 02/04/21 0329 02/04/21 0916 02/04/21 1718 02/04/21 2108 02/05/21 0436  NA 136 131* 132*  --   --  136 136  K 3.2* 4.6 5.7* 5.4* 5.5* 5.2* 5.2*  CL 104 103 103  --   --  105 105  CO2 22 19* 22  --   --  23 26  GLUCOSE 98 175* 144*  --   --  138* 140*  BUN 8 19 33*  --   --  31* 34*  CREATININE 0.83 0.77 1.08*  --   --  0.86 0.85  CALCIUM 8.2* 8.3* 8.5*  --   --  8.3* 8.1*  MG 4.8* 1.9 2.2  --   --   --  2.7*  PHOS  --  3.8 3.2  --   --   --  3.4   GFR: Estimated Creatinine Clearance: 67.7 mL/min (by C-G formula based on SCr of 0.85 mg/dL). Recent Labs  Lab 01/24/2021 2300 02/03/21 0414 02/04/21 0329 02/05/21 0436  PROCALCITON <0.10 <0.10 <0.10 <0.10  WBC 13.3* 28.7* 23.9* 16.0*    Liver Function Tests: Recent Labs  Lab 02/03/21 0414  02/04/21 0329 02/05/21 0436  AST 22  --   --   ALT 19  --   --   ALKPHOS 54  --   --   BILITOT 0.7  --   --   PROT 6.1*  --   --   ALBUMIN 3.4* 3.6 3.2*   No results for input(s): LIPASE, AMYLASE in the last 168 hours. No results for input(s): AMMONIA in the last 168 hours.  ABG    Component Value Date/Time   PHART 7.38 02/04/2021 2130   PCO2ART 40 02/04/2021 2130   PO2ART 127 (H) 02/04/2021 2130   HCO3 23.7 02/04/2021 2130   ACIDBASEDEF 1.3 02/04/2021 2130   O2SAT 98.8 02/04/2021 2130     Coagulation Profile: No results for input(s): INR, PROTIME in the last 168 hours.  Cardiac Enzymes: No results for input(s): CKTOTAL, CKMB, CKMBINDEX, TROPONINI in the last 168 hours.  HbA1C: Hgb A1c  MFr Bld  Date/Time Value Ref Range Status  02/03/2021 04:14 AM 5.7 (H) 4.8 - 5.6 % Final    Comment:    (NOTE) Pre diabetes:          5.7%-6.4%  Diabetes:              >6.4%  Glycemic control for   <7.0% adults with diabetes     CBG: Recent Labs  Lab 02/04/21 1624 02/04/21 1913 02/04/21 2308 02/05/21 0327 02/05/21 0710  GLUCAP 111* 131* 140* 159* 127*    Total critical care time: 49 minutes  Performed by: [place] care time was exclusive of separately billable procedures and treating other patients.   Critical care was necessary to treat or prevent imminent or life-threatening deterioration.   Critical care was time spent personally by me on the following activities: development of treatment plan with patient and/or surrogate as well as nursing, discussions with consultants, evaluation of patient's response to treatment, examination of patient, obtaining history from patient or surrogate, ordering and performing treatments and interventions, ordering and review of laboratory studies, ordering and review of radiographic studies, pulse oximetry and re-evaluation of patient's condition.   Jacky Kindle MD [facility] See Amion for  pager If no response to pager, please call [phone] until 7pm After 7pm, Please call E-link [phone]

## 2021-02-06 DIAGNOSIS — J441 Chronic obstructive pulmonary disease with (acute) exacerbation: Secondary | ICD-10-CM | POA: Diagnosis not present

## 2021-02-06 LAB — BASIC METABOLIC PANEL
Anion gap: 6 (ref 5–15)
BUN: 52 mg/dL — ABNORMAL HIGH (ref 8–23)
CO2: 26 mmol/L (ref 22–32)
Calcium: 8.5 mg/dL — ABNORMAL LOW (ref 8.9–10.3)
Chloride: 105 mmol/L (ref 98–111)
Creatinine, Ser: 0.99 mg/dL (ref 0.44–1.00)
GFR, Estimated: >60 mL/min (ref >60)
Glucose, Bld: 132 mg/dL — ABNORMAL HIGH (ref 70–99)
Potassium: 6 mmol/L — ABNORMAL HIGH (ref 3.5–5.1)
Sodium: 137 mmol/L (ref 135–145)

## 2021-02-06 LAB — BLOOD GAS, ARTERIAL
Acid-Base Excess: 0 mmol/L (ref 0.0–2.0)
Bicarbonate: 27.8 mmol/L (ref 20.0–28.0)
FIO2: 0.4
MECHVT: 380 mL
O2 Saturation: 95.2 %
PEEP: 8 cmH2O
Patient temperature: 37
RATE: 24 resp/min
pCO2 arterial: 62 mmHg — ABNORMAL HIGH (ref 32.0–48.0)
pH, Arterial: 7.26 — ABNORMAL LOW (ref 7.350–7.450)
pO2, Arterial: 88 mmHg (ref 83.0–108.0)

## 2021-02-06 LAB — GLUCOSE, CAPILLARY
Glucose-Capillary: 134 mg/dL — ABNORMAL HIGH (ref 70–99)
Glucose-Capillary: 129 mg/dL — ABNORMAL HIGH (ref 70–99)
Glucose-Capillary: 168 mg/dL — ABNORMAL HIGH (ref 70–99)
Glucose-Capillary: 132 mg/dL — ABNORMAL HIGH (ref 70–99)
Glucose-Capillary: 130 mg/dL — ABNORMAL HIGH (ref 70–99)
Glucose-Capillary: 140 mg/dL — ABNORMAL HIGH (ref 70–99)

## 2021-02-06 LAB — CBC
HCT: 33.3 % — ABNORMAL LOW (ref 36.0–46.0)
Hemoglobin: 10.6 g/dL — ABNORMAL LOW (ref 12.0–15.0)
MCH: 31.7 pg (ref 26.0–34.0)
MCHC: 31.8 g/dL (ref 30.0–36.0)
MCV: 99.7 fL (ref 80.0–100.0)
Platelets: 206 10*3/uL (ref 150–400)
RBC: 3.34 MIL/uL — ABNORMAL LOW (ref 3.87–5.11)
RDW: 14.6 % (ref 11.5–15.5)
WBC: 16.6 10*3/uL — ABNORMAL HIGH (ref 4.0–10.5)
nRBC: 0 % (ref 0.0–0.2)

## 2021-02-06 LAB — TRIGLYCERIDES: Triglycerides: 50 mg/dL (ref <150)

## 2021-02-06 LAB — POTASSIUM
Potassium: 5.6 mmol/L — ABNORMAL HIGH (ref 3.5–5.1)
Potassium: 5.1 mmol/L (ref 3.5–5.1)

## 2021-02-06 MED ORDER — INSULIN REGULAR HUMAN 100 UNIT/ML IJ SOLN: 10.0000 [IU] | Freq: Once | INTRAMUSCULAR | Status: DC

## 2021-02-06 MED ORDER — DEXTROSE 50 % IV SOLN
1.0000 | Freq: Once | INTRAVENOUS | Status: AC
  Administered 2021-02-06: 50 mL via INTRAVENOUS
  Filled 2021-02-06: qty 50

## 2021-02-06 MED ORDER — DEXMEDETOMIDINE HCL IN NACL 400 MCG/100ML IV SOLN
0.4000 ug/kg/h | INTRAVENOUS | Status: DC
  Administered 2021-02-06: 0.4 ug/kg/h via INTRAVENOUS
  Administered 2021-02-06 – 2021-02-07 (×2): 0.6 ug/kg/h via INTRAVENOUS
  Administered 2021-02-07: 1 ug/kg/h via INTRAVENOUS
  Administered 2021-02-07: 0.9 ug/kg/h via INTRAVENOUS
  Administered 2021-02-07 – 2021-02-08 (×7): 1.2 ug/kg/h via INTRAVENOUS
  Filled 2021-02-06 (×12): qty 100

## 2021-02-06 MED ORDER — IPRATROPIUM-ALBUTEROL 0.5-2.5 (3) MG/3ML IN SOLN
3.0000 mL | RESPIRATORY_TRACT | Status: DC
  Administered 2021-02-06 – 2021-02-08 (×13): 3 mL via RESPIRATORY_TRACT
  Filled 2021-02-06 (×13): qty 3

## 2021-02-06 MED ORDER — SODIUM ZIRCONIUM CYCLOSILICATE 5 G PO PACK
10.0000 g | PACK | Freq: Two times a day (BID) | ORAL | Status: AC
  Administered 2021-02-06 (×2): 10 g
  Filled 2021-02-06 (×3): qty 2

## 2021-02-06 MED ORDER — INSULIN ASPART 100 UNIT/ML IV SOLN
10.0000 [IU] | Freq: Once | INTRAVENOUS | Status: AC
  Administered 2021-02-06: 10 [IU] via INTRAVENOUS
  Filled 2021-02-06: qty 0.1

## 2021-02-06 MED ORDER — ALBUTEROL SULFATE (2.5 MG/3ML) 0.083% IN NEBU
7.5000 mg | INHALATION_SOLUTION | Freq: Once | RESPIRATORY_TRACT | Status: AC
  Administered 2021-02-06: 7.5 mg via RESPIRATORY_TRACT
  Filled 2021-02-06: qty 9

## 2021-02-06 NOTE — Progress Notes (Signed)
Pt care assumed. Pt remains on vent support. Pt continues to be dy-synchronous w/ the vent. Pt on fent/prop gtt. Bbs: decreased/ wheeze t/o lung fields. Neb tx given as ordered. pPlat 25, pips  varying mid to upper 30's. Plan to continue as ordered.

## 2021-02-06 NOTE — Plan of Care (Signed)
Neuro: remains sedated, Precedex ordered to assist in Propofol wean Resp: continues to be dyssynchronous on the ventilator, wheezing present-very little air movement CV: afebrile. Vital signs stable, increasing edema GIGU: tolerating tube feeds well, foley in place, no BM Skin: clean dry and intact Social: Multiple family members visited throughout the day today, all questions and concerns addressed  Problem: Education: Goal: Knowledge of General Education information will improve Description: Including pain rating scale, medication(s)/side effects and non-pharmacologic comfort measures Outcome: Not Progressing   Problem: Health Behavior/Discharge Planning: Goal: Ability to manage health-related needs will improve Outcome: Not Progressing   Problem: Clinical Measurements: Goal: Ability to maintain clinical measurements within normal limits will improve Outcome: Not Progressing Goal: Will remain free from infection Outcome: Not Progressing Goal: Diagnostic test results will improve Outcome: Not Progressing Goal: Respiratory complications will improve Outcome: Not Progressing Goal: Cardiovascular complication will be avoided Outcome: Not Progressing   Problem: Activity: Goal: Risk for activity intolerance will decrease Outcome: Not Progressing   Problem: Nutrition: Goal: Adequate nutrition will be maintained Outcome: Not Progressing   Problem: Coping: Goal: Level of anxiety will decrease Outcome: Not Progressing   Problem: Elimination: Goal: Will not experience complications related to bowel motility Outcome: Not Progressing Goal: Will not experience complications related to urinary retention Outcome: Not Progressing   Problem: Pain Managment: Goal: General experience of comfort will improve Outcome: Not Progressing   Problem: Safety: Goal: Ability to remain free from injury will improve Outcome: Not Progressing   Problem: Skin Integrity: Goal: Risk for impaired  skin integrity will decrease Outcome: Not Progressing   Problem: Activity: Goal: Ability to tolerate increased activity will improve Outcome: Not Progressing   Problem: Respiratory: Goal: Ability to maintain a clear airway and adequate ventilation will improve Outcome: Not Progressing   Problem: Role Relationship: Goal: Method of communication will improve Outcome: Not Progressing

## 2021-02-06 NOTE — Progress Notes (Signed)
Patient continues to be dyssynchronous with the ventilator. PRNs given without much improvement.   2 daughters briefly visited, witnessed patient's discomfort. Communication attempted regarding the seriousness of the patient's status. Daughters have flat affect and show signs of denial and anger. Both left abruptly without further discussion.

## 2021-02-06 NOTE — Progress Notes (Signed)
NAME:  Alicia Rivera, MRN:  [id number], DOB:  06-19-1956, LOS: 4 ADMISSION DATE:  01/14/2021, CONSULTATION DATE: 02/02/2021 REFERRING MD: Dr. Damita Dunnings, CHIEF COMPLAINT: Shortness of breath  History of Present Illness:  65 year old female with a known past medical history of COPD, anxiety & current smoker presenting to [facility] ED with several day history of wheezing and shortness of breath that became acutely worse overnight on 01/04/2021.  Per ED documentation she reported using her inhalers at home and denied any changes to her chronic cough, but did endorse chest tightness without chest pain.  She confirmed she is still smoking cigarettes.     Events: Including procedures, antibiotic start and stop dates in addition to other pertinent events   . 01/31/2021-admitted with TRH for COPD exacerbation requiring acute oxygen . 02/02/2021- urgently transferred to ICU due to respiratory distress requiring emergent RSI and mechanical ventilation . 02/03/2021-CTA Chest revealed subsegmental and distal lower lung branches are obscured by motion.  Elsewhere no pulmonary embolus identified.  Chronic Emphysema. Lower lung volumes with atelectasis, but no focal infection.  Osteopenia with new or progressed compression fractures of T8 through T10, T12, and L1 since 2019 . 02/03/21- patient remains on ventilator - met with family (two people son and daughter) they were very irate and wanted patient immediately moved to [facility] to be evaluted by COPD specialist. I have explained this is very unlikely to occur and spoke with [facility] to request transfer to accommodate family. However, no beds available at [facility] . 02/04/2021-will perform WUA and attempt SBT  . Continue to double stack on the ventilator and generating auto PEEP, placed on continuous albuterol . 02/06/2021- Remains critically ill, continues to be Asynchronous with vent, vent settings adjusted with some improvement in asynchrony   Interim history/subjective   -Continues to have issues with vent asynchrony -Dr. Loanne Drilling adjusted vent trigger and tidal volume (to 350 ml), follow up ABG pending -Afebrile, hemodynamically stable, no vasopressors -Hyperkalemia this morning, treating with insulin and Lokelma; repeat K+ this afternoon -Adding precedex to assist with anxiety   Objective   Blood pressure 119/72, pulse 93, temperature 99.6 F (37.6 C), temperature source Oral, resp. rate (!) 29, height _0  (1.626 m), weight 84.9 kg, SpO2 96 %.    Vent Mode: PRVC FiO2 (%):  [30 %-50 %] 40 % Set Rate:  [24 bmp-32 bmp] 24 bmp Vt Set:  [350 mL-400 mL] 350 mL PEEP:  [5 cmH20-12 cmH20] 8 cmH20 Plateau Pressure:  [15 cmH20-25 cmH20] 25 cmH20   Intake/Output Summary (Last 24 hours) at 02/06/2021 0904 Last data filed at 02/06/2021 0800 Gross per 24 hour  Intake 3237.46 ml  Output 1700 ml  Net 1537.46 ml   Filed Weights   01/15/2021 2259 02/02/21 2345 02/04/21 0325  Weight: 81.6 kg 84.4 kg 84.9 kg    Examination: General: Critically ill-appearing female, intubated and sedated on the ventilator, no acute distress HEENT: Atraumatic, normocephalic, neck supple, no JVD, ET tube in place,MM pink and moist Neuro: Deeply sedated due to vent asynchrony, does not follow commands, pupils PERRLA CV: Regular rate and rhythm, S1-S2, no murmurs, rubs, gallops, 2+ distal pulses Pulm: Diffuse inspiratory and expiratory wheezing, no rales, asynchronous with the ventilator GI: Soft, nontender, nondistended, no guarding or rebound tenderness, bowel sounds positive x4 Skin: Limited exam-warm and dry.  No obvious rashes, lesions, ulcerations Extremities: No deformities, mild edema   IMAGING    Assessment & Plan:   Acute on chronic hypoxic Respiratory Failure secondary to  COPD exacerbation  -Full vent support, continue lung protective strategies -Dr. Loanne Drilling adjusted trigger settings and tidal volumes today, PEEP decreased to 8 -Wean FiO2 and PEEP as tolerated  maintain O2 sats 88 to 92% -Follow intermittent chest x-ray and ABG as needed -Implement VAP bundle -Spontaneous breathing trials when respiratory parameters met ~ no SBT performed today 5/4 after discussion with Dr. Loanne Drilling due to severe wheezing and vent asynchrony -To receive continuous albuterol nebulization x1 -Bronchodilators scheduled and as needed -Continue budesonide and Brovana nebs -Continue Solu-Medrol 40 mg every 6 hours -Continue ceftriaxone and azithromycin for severe COPD exacerbation (plan for 5-day course), tracheal aspirate with Normal respiratory flora  Acute kidney injury secondary to ATN  Hyperkalemia Urinary retention  -Monitor I&O's / urinary output -Follow BMP -Ensure adequate renal perfusion -Avoid nephrotoxic agents as able -Replace electrolytes as indicated -Will give 10 units insulin, continuous Albuterol x1, and Lokelma -Follow up K+ later this afternoon  Anemia of critical illness -Monitor for S/Sx of bleeding -Trend CBC -Lovenox for VTE Prophylaxis  -Transfuse for Hgb <7  Steroid induced hyperglycemia  -CBG q4hrs  -SSI  -Follow ICU Hypo/hyperglycemia protocol  Anxiety at baseline  Sedation needs in setting of mechanical Ventilation -Maintain a RASS goal of -3 due to vent asynchrony -Fentanyl and Propofol gtt's as needed -Daily wake up assessment -Continue scheduled outpatient xanax  -Continue Seroquel nightly -Supportive care -Will add Precedex   Best practice (right click and "Reselect all SmartList Selections" daily)  Diet:  NPO, feeds Pain/Anxiety/Delirium protocol (if indicated): Yes RASS goal -3 VAP protocol (if indicated): Yes DVT prophylaxis: LMWH GI prophylaxis: H2B Glucose control:  SSI Central venous access:  N/A Arterial line:  N/A Foley: Yes,  Mobility:  bed rest  PT consulted: N/A Last date of multidisciplinary goals of care discussion 02/06/2021.  Patient's son (POA) updated at bedside 02/06/21. Code Status:  full  code Disposition: ICU  Labs   CBC: Recent Labs  Lab 01/15/2021 2300 02/03/21 0414 02/04/21 0329 02/05/21 0436 02/06/21 0837  WBC 13.3* 28.7* 23.9* 16.0* 16.6*  NEUTROABS  --  25.9* 21.7* 13.6*  --   HGB 14.6 11.3* 11.0* 10.3* 10.6*  HCT 42.6 34.1* 34.2* 32.1* 33.3*  MCV 93.4 95.8 97.7 98.8 99.7  PLT 274 291 266 215 081    Basic Metabolic Panel: Recent Labs  Lab 01/07/2021 2300 02/03/21 0414 02/04/21 0329 02/04/21 0916 02/04/21 1718 02/04/21 2108 02/05/21 0436 02/06/21 0837  NA 136 131* 132*  --   --  136 136 137  K 3.2* 4.6 5.7* 5.4* 5.5* 5.2* 5.2* 6.0*  CL 104 103 103  --   --  105 105 105  CO2 22 19* 22  --   --  _0 GLUCOSE 98 175* 144*  --   --  138* 140* 132*  BUN 8 19 33*  --   --  31* 34* 52*  CREATININE 0.83 0.77 1.08*  --   --  0.86 0.85 0.99  CALCIUM 8.2* 8.3* 8.5*  --   --  8.3* 8.1* 8.5*  MG 4.8* 1.9 2.2  --   --   --  2.7*  --   PHOS  --  3.8 3.2  --   --   --  3.4  --    GFR: Estimated Creatinine Clearance: 58.2 mL/min (by C-G formula based on SCr of 0.99 mg/dL). Recent Labs  Lab 01/23/2021 2300 02/03/21 0414 02/04/21 0329 02/05/21 0436 02/06/21 0837  PROCALCITON <0.10 <0.10 <0.10 <0.10  --  WBC 13.3* 28.7* 23.9* 16.0* 16.6*    Liver Function Tests: Recent Labs  Lab 02/03/21 0414 02/04/21 0329 02/05/21 0436  AST 22  --   --   ALT 19  --   --   ALKPHOS 54  --   --   BILITOT 0.7  --   --   PROT 6.1*  --   --   ALBUMIN 3.4* 3.6 3.2*   No results for input(s): LIPASE, AMYLASE in the last 168 hours. No results for input(s): AMMONIA in the last 168 hours.  ABG    Component Value Date/Time   PHART 7.38 02/04/2021 2130   PCO2ART 40 02/04/2021 2130   PO2ART 127 (H) 02/04/2021 2130   HCO3 23.7 02/04/2021 2130   ACIDBASEDEF 1.3 02/04/2021 2130   O2SAT 98.8 02/04/2021 2130     Coagulation Profile: No results for input(s): INR, PROTIME in the last 168 hours.  Cardiac Enzymes: No results for input(s): CKTOTAL, CKMB, CKMBINDEX,  TROPONINI in the last 168 hours.  HbA1C: Hgb A1c MFr Bld  Date/Time Value Ref Range Status  02/03/2021 04:14 AM 5.7 (H) 4.8 - 5.6 % Final    Comment:    (NOTE) Pre diabetes:          5.7%-6.4%  Diabetes:              >6.4%  Glycemic control for   <7.0% adults with diabetes     CBG: Recent Labs  Lab 02/05/21 1613 02/05/21 1940 02/05/21 2315 02/06/21 0340 02/06/21 0728  GLUCAP 122* 126* 142* 134* 129*      Darel Hong, AGACNP-BC [facility] Prefer epic messenger for cross cover needs If after hours, please call E-link

## 2021-02-06 NOTE — Progress Notes (Signed)
PHARMACY CONSULT NOTE - FOLLOW UP  Pharmacy Consult for Electrolyte Monitoring and Replacement   Recent Labs: Potassium (mmol/L)  Date Value  02/06/2021 5.6 (H)  07/04/2014 4.0   Magnesium (mg/dL)  Date Value  93/90/3009 2.7 (H)   Calcium (mg/dL)  Date Value  23/30/0762 8.5 (L)   Calcium, Total (mg/dL)  Date Value  26/33/3545 8.8   Albumin (g/dL)  Date Value  62/56/3893 3.2 (L)  07/04/2014 3.8   Phosphorus (mg/dL)  Date Value  73/42/8768 3.4   Sodium (mmol/L)  Date Value  02/06/2021 137  07/04/2014 135 (L)   Corrected Ca: 9.14 mg/dL  Assessment: 65 y.o. female with medical history significant for HTN, COPD and anxiety presented with a  COPD exacerbation and transferred fto CCU requiring intubation.  She has had persistent hyperkalemia. This morning, following a potassium level of 6.0, she received 10 units of IV insulin + one amp D50 and 10 grams po Lokelma. Pharmacy was asked to follow electrolytes and replace.  Goal of Therapy:  Electrolytes WNL  Plan:   Repeat 10 units of IV insulin + one amp D50  Repeat 10 grams po Lokelma  Both orders were reviewed with NP  Re-check potassium at 1800  Lowella Bandy ,PharmD Clinical Pharmacist 02/06/2021 2:53 PM

## 2021-02-07 ENCOUNTER — Inpatient Hospital Stay: Payer: Medicare Other

## 2021-02-07 DIAGNOSIS — J441 Chronic obstructive pulmonary disease with (acute) exacerbation: Secondary | ICD-10-CM | POA: Diagnosis not present

## 2021-02-07 LAB — BASIC METABOLIC PANEL
Anion gap: 7 (ref 5–15)
BUN: 52 mg/dL — ABNORMAL HIGH (ref 8–23)
CO2: 28 mmol/L (ref 22–32)
Calcium: 8.5 mg/dL — ABNORMAL LOW (ref 8.9–10.3)
Chloride: 108 mmol/L (ref 98–111)
Creatinine, Ser: 0.74 mg/dL (ref 0.44–1.00)
GFR, Estimated: >60 mL/min (ref >60)
Glucose, Bld: 149 mg/dL — ABNORMAL HIGH (ref 70–99)
Potassium: 5.6 mmol/L — ABNORMAL HIGH (ref 3.5–5.1)
Sodium: 143 mmol/L (ref 135–145)

## 2021-02-07 LAB — CBC
HCT: 32.4 % — ABNORMAL LOW (ref 36.0–46.0)
Hemoglobin: 10.1 g/dL — ABNORMAL LOW (ref 12.0–15.0)
MCH: 31.7 pg (ref 26.0–34.0)
MCHC: 31.2 g/dL (ref 30.0–36.0)
MCV: 101.6 fL — ABNORMAL HIGH (ref 80.0–100.0)
Platelets: 195 10*3/uL (ref 150–400)
RBC: 3.19 MIL/uL — ABNORMAL LOW (ref 3.87–5.11)
RDW: 14.5 % (ref 11.5–15.5)
WBC: 13.9 10*3/uL — ABNORMAL HIGH (ref 4.0–10.5)
nRBC: 0 % (ref 0.0–0.2)

## 2021-02-07 LAB — GLUCOSE, CAPILLARY
Glucose-Capillary: 136 mg/dL — ABNORMAL HIGH (ref 70–99)
Glucose-Capillary: 158 mg/dL — ABNORMAL HIGH (ref 70–99)
Glucose-Capillary: 116 mg/dL — ABNORMAL HIGH (ref 70–99)
Glucose-Capillary: 152 mg/dL — ABNORMAL HIGH (ref 70–99)
Glucose-Capillary: 148 mg/dL — ABNORMAL HIGH (ref 70–99)
Glucose-Capillary: 128 mg/dL — ABNORMAL HIGH (ref 70–99)

## 2021-02-07 LAB — MAGNESIUM: Magnesium: 2.7 mg/dL — ABNORMAL HIGH (ref 1.7–2.4)

## 2021-02-07 LAB — POTASSIUM: Potassium: 5.2 mmol/L — ABNORMAL HIGH (ref 3.5–5.1)

## 2021-02-07 MED ORDER — SODIUM ZIRCONIUM CYCLOSILICATE 5 G PO PACK
10.0000 g | PACK | Freq: Once | ORAL | Status: AC
  Administered 2021-02-07: 10 g
  Filled 2021-02-07: qty 2

## 2021-02-07 MED ORDER — DEXTROSE 50 % IV SOLN
1.0000 | Freq: Once | INTRAVENOUS | Status: AC
  Administered 2021-02-07: 50 mL via INTRAVENOUS
  Filled 2021-02-07: qty 50

## 2021-02-07 MED ORDER — INSULIN ASPART 100 UNIT/ML IV SOLN
10.0000 [IU] | Freq: Once | INTRAVENOUS | Status: AC
  Administered 2021-02-07: 10 [IU] via INTRAVENOUS
  Filled 2021-02-07: qty 0.1

## 2021-02-07 NOTE — Progress Notes (Addendum)
NAME:  Alicia Rivera, MRN:  [id number], DOB:  12/14/1955, LOS: 5 ADMISSION DATE:  01/20/2021, CONSULTATION DATE: 01/31/2021 REFERRING MD: Dr. Damita Dunnings, CHIEF COMPLAINT: Shortness of breath  History of Present Illness:  65 year old female with a known past medical history of COPD, anxiety & current smoker presenting to [facility] ED with several day history of wheezing and shortness of breath that became acutely worse overnight on 01/23/2021.  Per ED documentation she reported using her inhalers at home and denied any changes to her chronic cough, but did endorse chest tightness without chest pain.  She confirmed she is still smoking cigarettes.     Events: Including procedures, antibiotic start and stop dates in addition to other pertinent events   . 01/28/2021-admitted with TRH for COPD exacerbation requiring acute oxygen . 02/02/2021- urgently transferred to ICU due to respiratory distress requiring emergent RSI and mechanical ventilation . 02/03/2021-CTA Chest revealed subsegmental and distal lower lung branches are obscured by motion.  Elsewhere no pulmonary embolus identified.  Chronic Emphysema. Lower lung volumes with atelectasis, but no focal infection.  Osteopenia with new or progressed compression fractures of T8 through T10, T12, and L1 since 2019 . 02/03/21- patient remains on ventilator - met with family (two people son and daughter) they were very irate and wanted patient immediately moved to [facility] to be evaluted by COPD specialist. I have explained this is very unlikely to occur and spoke with [facility] to request transfer to accommodate family. However, no beds available at [facility] . 02/04/2021-will perform WUA and attempt SBT  . Continue to double stack on the ventilator and generating auto PEEP, placed on continuous albuterol . 02/06/2021- Remains critically ill, continues to be Asynchronous with vent, vent settings adjusted with some improvement in asynchrony  . 02/07/2021- Still with vent  asychrony, still with episodes of Bronchospasm, obtain CT head, Palliative Care consulted  Interim history/subjective  -No acute events reported overnight -Afebrile, hemodynamically stable, no vasopressors -still with episodes of Bronchospasm and vent asynchrony -Calmer with addition of Precedex -Plan for CT head -Consult Palliative Care  Objective   Blood pressure 118/62, pulse 69, temperature (!) 97.16 F (36.2 C), resp. rate (!) 21, height '5\' 4"'  (1.626 m), weight 84.9 kg, SpO2 94 %.    Vent Mode: PRVC FiO2 (%):  [35 %-40 %] (P) 35 % Set Rate:  [24 bmp] 24 bmp Vt Set:  [370 mL-430 mL] 370 mL PEEP:  [8 cmH20] 8 cmH20 Plateau Pressure:  [22 cmH20-24 cmH20] 22 cmH20   Intake/Output Summary (Last 24 hours) at 02/07/2021 0837 Last data filed at 02/07/2021 0600 Gross per 24 hour  Intake 2342.67 ml  Output 3525 ml  Net -1182.33 ml   Filed Weights   01/13/2021 2259 02/02/21 2345 02/04/21 0325  Weight: 81.6 kg 84.4 kg 84.9 kg    Examination: General: Critically ill-appearing female, intubated and sedated on the ventilator, in no acute distress HEENT: Atraumatic, normocephalic, neck supple, no JVD, ET tube in place,MM pink and moist Neuro: Deeply sedated due to vent asynchrony, does not follow commands, pupils PERRLA CV: Regular rate and rhythm, S1-S2, no murmurs, rubs, gallops, 2+ distal pulses Pulm: Diffuse inspiratory and expiratory wheezing, no rales, asynchronous with the ventilator (but less than yesterday), even GI: Soft, nontender, nondistended, no guarding or rebound tenderness, bowel sounds positive x4 Skin: Limited exam-warm and dry.  No obvious rashes, lesions, ulcerations Extremities: No deformities, mild edema   IMAGING    Assessment & Plan:   Acute on chronic hypoxic  Respiratory Failure secondary to COPD exacerbation  -Full vent support, continue lung protective strategies -Wean FiO2 and PEEP as tolerated maintain O2 sats 88 to 92% -Follow intermittent chest x-ray  and ABG as needed -Implement VAP bundle -Spontaneous breathing trials when respiratory parameters met  -Bronchodilators scheduled and PRN -Continue budesonide and Brovana nebs -Continue Solu-Medrol 40 mg every 6 hours -Completed 5 day of course of Azithromycin & Ceftriaxone on 5/5 for severe COPD exacerbation, tracheal aspirate with Normal respiratory flora  Acute kidney injury secondary to ATN  Hyperkalemia Urinary retention  -Monitor I&O's / urinary output -Follow BMP -Ensure adequate renal perfusion -Avoid nephrotoxic agents as able -Replace electrolytes as indicated -Will give 10 units insulin + D50, and Lokelma -Follow up K+ later this afternoon  Anemia of critical illness -Monitor for S/Sx of bleeding -Trend CBC -Lovenox for VTE Prophylaxis  -Transfuse for Hgb <7  Steroid induced hyperglycemia  -CBG q4hrs  -SSI  -Follow ICU Hypo/hyperglycemia protocol  Anxiety at baseline  Sedation needs in setting of mechanical Ventilation -Maintain a RASS goal of -3 due to vent asynchrony -Fentanyl, Precedex, & Propofol gtt's as needed -Daily wake up assessment -Continue scheduled outpatient xanax  -Continue Seroquel nightly -Supportive care     Pt is critically ill.  Prognosis is very guarded.  High risk for cardiac arrest and death.  Low likelihood for weaning from ventilator and survival without Tracheostomy.  Had family discussion today 02/07/21 with pt's youngest son (POA) and oldest daughter.  Discussed poor prognosis, lack of progress with weaning from vent, and high likelihood of needing  Trach for survival.  Pt's family is very Adamant that their mother WOULD NOT want A TRACH.  We will continue current aggressive measures and daily SBT and see how she progresses.  Will consult Palliative Care to assist with goals of care.   Best practice (right click and "Reselect all SmartList Selections" daily)  Diet:  NPO, feeds Pain/Anxiety/Delirium protocol (if indicated): Yes RASS  goal -3 VAP protocol (if indicated): Yes DVT prophylaxis: LMWH GI prophylaxis: H2B Glucose control:  SSI Central venous access:  N/A Arterial line:  N/A Foley: Yes,  Mobility:  bed rest  PT consulted: N/A Last date of multidisciplinary goals of care discussion 02/07/2021.   Code Status:  full code Disposition: ICU  Labs   CBC: Recent Labs  Lab 02/03/21 0414 02/04/21 0329 02/05/21 0436 02/06/21 0837 02/07/21 0425  WBC 28.7* 23.9* 16.0* 16.6* 13.9*  NEUTROABS 25.9* 21.7* 13.6*  --   --   HGB 11.3* 11.0* 10.3* 10.6* 10.1*  HCT 34.1* 34.2* 32.1* 33.3* 32.4*  MCV 95.8 97.7 98.8 99.7 101.6*  PLT 291 266 215 206 165    Basic Metabolic Panel: Recent Labs  Lab 01/29/2021 2300 02/03/21 0414 02/04/21 0329 02/04/21 0916 02/04/21 2108 02/05/21 0436 02/06/21 0837 02/06/21 1423 02/06/21 1753 02/07/21 0425  NA 136 131* 132*  --  136 136 137  --   --  143  K 3.2* 4.6 5.7*   < > 5.2* 5.2* 6.0* 5.6* 5.1 5.6*  CL 104 103 103  --  105 105 105  --   --  108  CO2 22 19* 22  --  '23 26 26  ' --   --  28  GLUCOSE 98 175* 144*  --  138* 140* 132*  --   --  149*  BUN 8 19 33*  --  31* 34* 52*  --   --  52*  CREATININE 0.83 0.77 1.08*  --  0.86 0.85 0.99  --   --  0.74  CALCIUM 8.2* 8.3* 8.5*  --  8.3* 8.1* 8.5*  --   --  8.5*  MG 4.8* 1.9 2.2  --   --  2.7*  --   --   --  2.7*  PHOS  --  3.8 3.2  --   --  3.4  --   --   --   --    < > = values in this interval not displayed.   GFR: Estimated Creatinine Clearance: 72 mL/min (by C-G formula based on SCr of 0.74 mg/dL). Recent Labs  Lab 01/31/2021 2300 02/03/21 0414 02/04/21 0329 02/05/21 0436 02/06/21 0837 02/07/21 0425  PROCALCITON <0.10 <0.10 <0.10 <0.10  --   --   WBC 13.3* 28.7* 23.9* 16.0* 16.6* 13.9*    Liver Function Tests: Recent Labs  Lab 02/03/21 0414 02/04/21 0329 02/05/21 0436  AST 22  --   --   ALT 19  --   --   ALKPHOS 54  --   --   BILITOT 0.7  --   --   PROT 6.1*  --   --   ALBUMIN 3.4* 3.6 3.2*   No  results for input(s): LIPASE, AMYLASE in the last 168 hours. No results for input(s): AMMONIA in the last 168 hours.  ABG    Component Value Date/Time   PHART 7.31 (L) 02/07/2021 0500   PCO2ART 64 (H) 02/07/2021 0500   PO2ART 115 (H) 02/07/2021 0500   HCO3 32.2 (H) 02/07/2021 0500   ACIDBASEDEF 1.3 02/04/2021 2130   O2SAT 98.1 02/07/2021 0500     Coagulation Profile: No results for input(s): INR, PROTIME in the last 168 hours.  Cardiac Enzymes: No results for input(s): CKTOTAL, CKMB, CKMBINDEX, TROPONINI in the last 168 hours.  HbA1C: Hgb A1c MFr Bld  Date/Time Value Ref Range Status  02/03/2021 04:14 AM 5.7 (H) 4.8 - 5.6 % Final    Comment:    (NOTE) Pre diabetes:          5.7%-6.4%  Diabetes:              >6.4%  Glycemic control for   <7.0% adults with diabetes     CBG: Recent Labs  Lab 02/06/21 1531 02/06/21 1917 02/06/21 2310 02/07/21 0312 02/07/21 0717  GLUCAP 132* 130* 140* 136* 158*    Critical Care time: 40 minutes  Darel Hong, AGACNP-BC [facility] Prefer epic messenger for cross cover needs If after hours, please call E-link

## 2021-02-07 NOTE — Progress Notes (Signed)
PHARMACY CONSULT NOTE - FOLLOW UP  Pharmacy Consult for Electrolyte Monitoring and Replacement   Recent Labs: Potassium (mmol/L)  Date Value  02/07/2021 5.6 (H)  07/04/2014 4.0   Magnesium (mg/dL)  Date Value  49/20/1007 2.7 (H)   Calcium (mg/dL)  Date Value  09/24/7587 8.5 (L)   Calcium, Total (mg/dL)  Date Value  32/54/9826 8.8   Albumin (g/dL)  Date Value  41/58/3094 3.2 (L)  07/04/2014 3.8   Phosphorus (mg/dL)  Date Value  07/68/0881 3.4   Sodium (mmol/L)  Date Value  02/07/2021 143  07/04/2014 135 (L)   Corrected Ca: 9.14 mg/dL  Assessment: 65 y.o. female with medical history significant for HTN, COPD and anxiety presented with a  COPD exacerbation and transferred fto CCU requiring intubation.  She has had persistent hyperkalemia. This morning, following a potassium level of 6.0, she received 10 units of IV insulin + one amp D50 and 10 grams po Lokelma. Pharmacy was asked to follow electrolytes and replace.  Free water q4h PROSource 88mL QID Vital High Protein @35mL /hr  Goal of Therapy:  Electrolytes WNL  Plan:   K 5.6 s/p 10u IV insulin + amp D50 + 10g PO lokelma x2; discussed with NP, will repeat 10u IV insulin + amp D50 + 10g PO lokelma x1 and repeat K 1400   Re-check electrolytes with AM labs   , PharmD Pharmacy Resident  02/07/2021 6:34 AM

## 2021-02-07 NOTE — Progress Notes (Signed)
Pt was transported to CT from CCU and back while on the vent. 

## 2021-02-07 NOTE — Progress Notes (Signed)
During initial assessment patient unresponsive, dyssynchronous breathing with the ventalitor. MD in to evaluate patient. Propofol stopped, fentanyl decreased by 50% and precedex infusing. During rounds discussed patients heart rate of 120-140's and elevated blood pressure. Orders for CT. Family at bedside and updated. Continue to assess.

## 2021-02-08 DIAGNOSIS — Z7189 Other specified counseling: Secondary | ICD-10-CM

## 2021-02-08 DIAGNOSIS — J441 Chronic obstructive pulmonary disease with (acute) exacerbation: Secondary | ICD-10-CM | POA: Diagnosis not present

## 2021-02-08 LAB — PHOSPHORUS: Phosphorus: 3.8 mg/dL (ref 2.5–4.6)

## 2021-02-08 LAB — BASIC METABOLIC PANEL
Anion gap: 7 (ref 5–15)
BUN: 56 mg/dL — ABNORMAL HIGH (ref 8–23)
CO2: 33 mmol/L — ABNORMAL HIGH (ref 22–32)
Calcium: 8.4 mg/dL — ABNORMAL LOW (ref 8.9–10.3)
Chloride: 106 mmol/L (ref 98–111)
Creatinine, Ser: 0.78 mg/dL (ref 0.44–1.00)
GFR, Estimated: >60 mL/min (ref >60)
Glucose, Bld: 139 mg/dL — ABNORMAL HIGH (ref 70–99)
Potassium: 5.3 mmol/L — ABNORMAL HIGH (ref 3.5–5.1)
Sodium: 146 mmol/L — ABNORMAL HIGH (ref 135–145)

## 2021-02-08 LAB — GLUCOSE, CAPILLARY
Glucose-Capillary: 123 mg/dL — ABNORMAL HIGH (ref 70–99)
Glucose-Capillary: 153 mg/dL — ABNORMAL HIGH (ref 70–99)
Glucose-Capillary: 143 mg/dL — ABNORMAL HIGH (ref 70–99)

## 2021-02-08 LAB — CBC
HCT: 37.2 % (ref 36.0–46.0)
Hemoglobin: 11.8 g/dL — ABNORMAL LOW (ref 12.0–15.0)
MCH: 31.5 pg (ref 26.0–34.0)
MCHC: 31.7 g/dL (ref 30.0–36.0)
MCV: 99.2 fL (ref 80.0–100.0)
Platelets: 243 10*3/uL (ref 150–400)
RBC: 3.75 MIL/uL — ABNORMAL LOW (ref 3.87–5.11)
RDW: 14.5 % (ref 11.5–15.5)
WBC: 16.4 10*3/uL — ABNORMAL HIGH (ref 4.0–10.5)
nRBC: 0 % (ref 0.0–0.2)

## 2021-02-08 LAB — MAGNESIUM: Magnesium: 2.4 mg/dL (ref 1.7–2.4)

## 2021-02-08 MED ORDER — LABETALOL HCL 5 MG/ML IV SOLN: 10.0000 mg | INTRAVENOUS | Status: DC | PRN

## 2021-02-08 MED ORDER — LACTULOSE 10 GM/15ML PO SOLN
30.0000 g | Freq: Three times a day (TID) | ORAL | Status: DC
  Administered 2021-02-08: 30 g via ORAL
  Filled 2021-02-08: qty 60

## 2021-02-08 MED ORDER — METHYLPREDNISOLONE SODIUM SUCC 125 MG IJ SOLR: 60.0000 mg | Freq: Every day | INTRAMUSCULAR | Status: DC

## 2021-02-08 MED ORDER — OXYCODONE HCL 5 MG PO TABS
5.0000 mg | ORAL_TABLET | Freq: Four times a day (QID) | ORAL | Status: DC
  Administered 2021-02-08: 5 mg
  Filled 2021-02-08: qty 1

## 2021-02-08 MED ORDER — HYDROCHLOROTHIAZIDE 25 MG PO TABS
12.5000 mg | ORAL_TABLET | Freq: Every day | ORAL | Status: DC
  Administered 2021-02-08: 12.5 mg
  Filled 2021-02-08: qty 1

## 2021-02-08 MED ORDER — MORPHINE 100MG IN NS 100ML (1MG/ML) PREMIX INFUSION
0.0000 mg/h | INTRAVENOUS | Status: DC
Start: 2021-02-08 — End: 2021-02-09
  Administered 2021-02-08: 2 mg/h via INTRAVENOUS
  Filled 2021-02-08: qty 100

## 2021-02-08 MED ORDER — GLYCOPYRROLATE 0.2 MG/ML IJ SOLN
0.2000 mg | Freq: Once | INTRAMUSCULAR | Status: AC
  Administered 2021-02-08: 0.2 mg via INTRAVENOUS
  Filled 2021-02-08: qty 1

## 2021-02-08 MED ORDER — PROSOURCE TF PO LIQD: 45.0000 mL | Freq: Every day | ORAL | Status: DC

## 2021-02-08 MED ORDER — LORAZEPAM 2 MG/ML IJ SOLN: 2.0000 mg | INTRAMUSCULAR | Status: DC | PRN

## 2021-02-08 MED ORDER — ALBUTEROL SULFATE (2.5 MG/3ML) 0.083% IN NEBU
2.5000 mg | INHALATION_SOLUTION | RESPIRATORY_TRACT | Status: DC | PRN
Start: 2021-02-08 — End: 2021-02-09

## 2021-02-08 MED ORDER — VITAL HIGH PROTEIN PO LIQD
1000.0000 mL | ORAL | Status: DC
  Administered 2021-02-08: 1000 mL

## 2021-02-08 MED ORDER — QUETIAPINE FUMARATE 25 MG PO TABS
50.0000 mg | ORAL_TABLET | Freq: Two times a day (BID) | ORAL | Status: DC
  Administered 2021-02-08: 50 mg
  Filled 2021-02-08: qty 2

## 2021-02-08 MED ORDER — MORPHINE BOLUS VIA INFUSION
4.0000 mg | INTRAVENOUS | Status: DC | PRN
  Administered 2021-02-08: 4 mg via INTRAVENOUS
  Filled 2021-02-08: qty 4

## 2021-02-08 MED ORDER — LACTULOSE 10 GM/15ML PO SOLN: 30.0000 g | Freq: Three times a day (TID) | ORAL | Status: DC

## 2021-02-08 MED ORDER — HYDROCHLOROTHIAZIDE 12.5 MG PO CAPS: 12.5000 mg | ORAL_CAPSULE | Freq: Every day | ORAL | Status: DC

## 2021-02-12 LAB — BLOOD GAS, ARTERIAL
Acid-Base Excess: 4.5 mmol/L — ABNORMAL HIGH (ref 0.0–2.0)
Acid-base deficit: 1.3 mmol/L (ref 0.0–2.0)
Bicarbonate: 23.7 mmol/L (ref 20.0–28.0)
Bicarbonate: 32.2 mmol/L — ABNORMAL HIGH (ref 20.0–28.0)
FIO2: 0.3
FIO2: 0.4
MECHVT: 370 mL
O2 Saturation: 98.8 %
O2 Saturation: 98.1 %
PEEP: 5 cmH2O
PEEP: 8 cmH2O
Patient temperature: 37
Patient temperature: 37
RATE: 18 resp/min
RATE: 24 resp/min
pCO2 arterial: 40 mmHg (ref 32.0–48.0)
pCO2 arterial: 64 mmHg — ABNORMAL HIGH (ref 32.0–48.0)
pH, Arterial: 7.38 (ref 7.350–7.450)
pH, Arterial: 7.31 — ABNORMAL LOW (ref 7.350–7.450)
pO2, Arterial: 127 mmHg — ABNORMAL HIGH (ref 83.0–108.0)
pO2, Arterial: 115 mmHg — ABNORMAL HIGH (ref 83.0–108.0)

## 2021-02-16 ENCOUNTER — Other Ambulatory Visit: Payer: Self-pay | Admitting: Nurse Practitioner

## 2021-02-16 DIAGNOSIS — J440 Chronic obstructive pulmonary disease with acute lower respiratory infection: Secondary | ICD-10-CM

## 2021-02-25 ENCOUNTER — Ambulatory Visit: Payer: Medicare Other | Admitting: Physician Assistant

## 2021-03-06 NOTE — TOC Initial Note (Signed)
Transition of Care [facility]) - Initial/Assessment Note    Patient Details  Name: Alicia Rivera MRN: [id number] Date of Birth: [birth year]  [facility]) CM/SW Contact:    Marina Goodell Phone Number: [phone] 02/07/2021, 10:49 AM  Clinical Narrative:                  Patient presents at [facility] due to SOB for a few days. Patient has a hx of CODP and uses inhalers at home. Patient continues to smokes cigarettes. Patient is currently in the ICU, intubated and sedated.  Wake up assessment attempted this AM, after 15 minutes patient's BP 214-115 and HR 143. Patient remains critically ill. Patient has seven children, complex family dynamic.  Her son Rudie Meyer [phone] is POA. Family scheduled to meet with palliative care today.  Expected Discharge Plan: Home w Home Health Services Barriers to Discharge: Continued Medical Work up   Patient Goals and CMS Choice     Choice offered to / list presented to : [facility] POA / Guardian  Expected Discharge Plan and Services Expected Discharge Plan: Home w Home Health Services In-house Referral: Clinical Social Work   Post Acute Care Choice: NA Living arrangements for the past 2 months: Single Family Home                                      Prior Living Arrangements/Services Living arrangements for the past 2 months: Single Family Home Lives with:: Self Patient language and need for interpreter reviewed:: Yes Do you feel safe going back to the place where you live?: Yes      Need for Family Participation in Patient Care: Yes (Comment) Care giver support system in place?: Yes (comment)   Criminal Activity/Legal Involvement Pertinent to Current Situation/Hospitalization: No - Comment as needed  Activities of Daily Living Home Assistive Devices/Equipment: None ADL Screening (condition at time of admission) Patient's cognitive ability adequate to safely complete daily activities?: Yes Is the patient deaf or have  difficulty hearing?: No Does the patient have difficulty seeing, even when wearing glasses/contacts?: No Does the patient have difficulty concentrating, remembering, or making decisions?: No Patient able to express need for assistance with ADLs?: Yes Does the patient have difficulty dressing or bathing?: No Independently performs ADLs?: Yes (appropriate for developmental age) Does the patient have difficulty walking or climbing stairs?: No Weakness of Legs: None Weakness of Arms/Hands: None  Permission Sought/Granted Permission sought to share information with : Facility Medical sales representative    Share Information with NAME: Pearla, Mckinny (Son)   [phone], Cain Sieve Daughter [phone], Erie Noe [phone], Cresenciano Genre Daughter [phone]           Emotional Assessment Appearance:: Appears stated age Attitude/Demeanor/Rapport: Unable to Assess Affect (typically observed): Unable to Assess   Alcohol / Substance Use: Not Applicable    Admission diagnosis:  COPD exacerbation (HCC) [J44.1] Acute respiratory failure with hypoxia (HCC) [J96.01] Patient Active Problem List   Diagnosis Date Noted  . Hyperlipidemia 02/03/2021  . Need for vaccination against Streptococcus pneumoniae using pneumococcal conjugate vaccine 7 08/18/2020  . Atherosclerosis of aorta (HCC) 06/26/2020  . Cough 08/12/2019  . Bronchitis 06/03/2019  . Obstructive chronic bronchitis without exacerbation (HCC) 06/03/2019  . Generalized anxiety disorder 06/03/2019  . Pneumonia 06/04/2018  . Elevated troponin level 01/30/2018  . Encephalopathy, metabolic 01/25/2018  . Acute on chronic respiratory failure with hypercapnia (  HCC) 01/08/2018  . Anxiety 01/03/2018  . Essential hypertension 01/03/2018  . Smoker   . Acute encephalopathy   . Acute respiratory failure with hypoxia (HCC) 04/16/2017  . COPD exacerbation (HCC)   . Palliative care by specialist   . Encounter for intubation    . Respiratory insufficiency 01/14/2014  . Hypoxemia 01/14/2014   PCP:  Lyndon Code, MD Pharmacy:   [facility] [phone] [place], [place] - [address] AT [facility] [address] [place] [address] Phone: [phone] Fax: [phone]     Social Determinants of Health (SDOH) Interventions    Readmission Risk Interventions No flowsheet data found.

## 2021-03-06 NOTE — Progress Notes (Signed)
  Initial assessment showed patient to be unresponsive.  Turned off patients Fentanyl and Precedex for Wake Up Assessment.  After 15 minutes patients BP at 214/114 and HR 143. Patient dyssynchronous with ventilator, grimacing and unable to follow commands Sedation medications resumed at 100%  MD aware, will continue to monitor patient closely.   Approximately 15 minutes after resuming sedation Patients BP 174/104  and HR down to 120's

## 2021-03-06 NOTE — Consult Note (Addendum)
Consultation Note Date: Mar 05, 2021   Patient Name: Alicia Rivera  DOB: [birth year]  MRN: [id number]  Age / Sex: 65 y.o., female  PCP: Lavera Guise, MD Referring Physician: Tyna Jaksch, MD  Reason for Consultation: Establishing goals of care  HPI/Patient Profile: Alicia Rivera is a 65 y.o. female with medical history significant for HTN, COPD and anxiety presents with several day history of wheezing that became acutely worse on the night of arrival, not adequately responding to home bronchodilator treatment.  She has associated chest tightness without nausea vomiting or diaphoresis.  She has a chronic cough which is her baseline.  She denies fever or chills.  Denies leg pain or swelling.    Clinical Assessment and Goals of Care: Patient is resting in bed on ventilator. Per CCM and RT she is unable to tolerate weaning trials (agitation, tachycardia and increased BP), is stacking breaths, air trapping with high PEEP values, and upon listening to lung sounds, very little air movement has made it very difficult to auscultate.   Met with large family in waiting room, they stated they were fine staying in the waiting room to talk. Patient has 8 children, and many other family members. One of her sons is her HPOA, and he was present.   They state since her stroke around 5 months ago she has declined. Prior to that she took her of her brother and was the backbone of the family. They state over the past few months her QOL has not been good.   We discussed her diagnosis, poor prognosis, GOC, EOL wishes disposition and options.  Created space and opportunity for patient  to explore thoughts and feelings regarding current medical information. Some of the family members state they were able to see signs she was headed to the place she is now with her health. She enjoyed smoking and continued to until this point.   A  detailed discussion was had today regarding advanced directives.  Concepts specific to code status, artifical feeding and hydration, IV antibiotics and rehospitalization were discussed.  The difference between an aggressive medical intervention path and a comfort care path was discussed.  Values and goals of care important to patient and family were attempted to be elicited.  Discussed limitations of medical interventions to prolong quality of life in some situations and discussed the concept of human mortality. Discussed concept of an acceptable QOL to her, and what she would want in this situation. Discussed conversations with CCM and RT regarding her sedation and attempts at SBTs, and her intolerance of trials.   They are a family of faith and discuss God's sovereignty. They all state she would not want a tracheostomy and would not want to be placed in a facility. They state they do not want her to suffer and upon discussing projected best and worst case scenario would like to shift to comfort care once all family has been able to visit.     SUMMARY OF RECOMMENDATIONS   Shift to comfort care once  family has been able to visit.   Prognosis:   Hours - Days  Discharge Planning: Anticipated Hospital Death      Primary Diagnoses: Present on Admission: . COPD exacerbation ([facility]) . Essential hypertension . Generalized anxiety disorder . Smoker   I have reviewed the medical record, interviewed the patient and family, and examined the patient. The following aspects are pertinent.  Past Medical History:  Diagnosis Date  . Anxiety   . COPD (chronic obstructive pulmonary disease) ([place])   . HTN (hypertension)   . Hyperlipidemia   . Ischemic cerebrovascular accident (CVA) ([place]) 10/2020   L thalamic cva  . Pneumonia 09/18/2017  . TIA (transient ischemic attack)   . Tobacco abuse    Social History   Socioeconomic History  . Marital status: Single    Spouse name: Not on file  . Number  of children: Not on file  . Years of education: Not on file  . Highest education level: Not on file  Occupational History  . Occupation: disabled  Tobacco Use  . Smoking status: Current Some Day Smoker    Years: 50.00    Types: Cigarettes  . Smokeless tobacco: Never Used  . Tobacco comment: 2 a week  Vaping Use  . Vaping Use: Never used  Substance and Sexual Activity  . Alcohol use: No  . Drug use: No  . Sexual activity: Never  Other Topics Concern  . Not on file  Social History Narrative  . Not on file   Social Determinants of Health   Financial Resource Strain: Not on file  Food Insecurity: Not on file  Transportation Needs: Not on file  Physical Activity: Not on file  Stress: Not on file  Social Connections: Not on file   Family History  Problem Relation Age of Onset  . Emphysema Brother   . Lung disease Mother   . Heart disease Father    Scheduled Meds: . ALPRAZolam  0.5 mg Per Tube BID  . arformoterol  15 mcg Nebulization BID  . aspirin  81 mg Per Tube Daily  . atorvastatin  80 mg Per Tube Daily  . budesonide (PULMICORT) nebulizer solution  0.5 mg Nebulization BID  . chlorhexidine gluconate (MEDLINE KIT)  15 mL Mouth Rinse BID  . Chlorhexidine Gluconate Cloth  6 each Topical Daily  . docusate  100 mg Per Tube BID  . enoxaparin (LOVENOX) injection  40 mg Subcutaneous Q24H  . [START ON 02/09/2021] feeding supplement (PROSource TF)  45 mL Per Tube Daily  . fentaNYL (SUBLIMAZE) injection  200 mcg Intravenous Once  . free water  100 mL Per Tube Q4H  . hydrochlorothiazide  12.5 mg Per Tube Daily  . insulin aspart  0-15 Units Subcutaneous Q4H  . ipratropium-albuterol  3 mL Nebulization Q4H  . lactulose  30 g Oral TID  . loratadine  10 mg Per Tube Daily  . mouth rinse  15 mL Mouth Rinse 10 times per day  . methylPREDNISolone (SOLU-MEDROL) injection  40 mg Intravenous Q6H  . multivitamin  15 mL Per Tube Daily  . nicotine  14 mg Transdermal Daily  . oxyCODONE  5 mg  Per Tube Q6H  . polyethylene glycol  17 g Per Tube Daily  . QUEtiapine  50 mg Per Tube BID   Continuous Infusions: . sodium chloride 10 mL/hr at 03-06-21 1200  . sodium chloride    . albuterol    . dexmedetomidine (PRECEDEX) IV infusion 1.2 mcg/kg/hr (03-06-21 1200)  . famotidine (  PEPCID) IV Stopped (Mar 02, 2021 1109)  . feeding supplement (VITAL HIGH PROTEIN) 1,000 mL (2021/03/02 1112)  . fentaNYL infusion INTRAVENOUS 100 mcg/hr (03/02/21 1200)  . sodium chloride Stopped (02/04/21 1544)   PRN Meds:.sodium chloride, acetaminophen, albuterol, fentaNYL, labetalol, midazolam Medications Prior to Admission:  Prior to Admission medications   Medication Sig Start Date End Date Taking? Authorizing Provider  albuterol (VENTOLIN HFA) 108 (90 Base) MCG/ACT inhaler Inhale 2 puffs into the lungs every 4 (four) hours as needed for wheezing or shortness of breath. 01/22/21  Yes Allyne Gee, MD  ALPRAZolam Duanne Moron) 0.25 MG tablet Take 1 tablet (0.25 mg total) by mouth 2 (two) times daily as needed for anxiety. 01/22/21  Yes Allyne Gee, MD  aspirin 81 MG chewable tablet Chew 81 mg by mouth daily. 01/09/21  Yes [provider]  atorvastatin (LIPITOR) 80 MG tablet Take 80 mg by mouth daily. 12/10/20  Yes [provider]  budesonide-formoterol (SYMBICORT) 160-4.5 MCG/ACT inhaler Inhale 1 puff into the lungs 2 (two) times daily. 11/19/20  Yes Luiz Ochoa, NP  DULoxetine (CYMBALTA) 30 MG capsule Take 1 capsule (30 mg total) by mouth daily. 12/10/20  Yes Luiz Ochoa, NP  ipratropium (ATROVENT) 0.02 % nebulizer solution USE 1 VIAL VIA NEBULIZER EVERY 6 HOURS AS NEEDED FOR WHEEZING OR SHORTNESS OF BREATH Patient taking differently: Take 0.5 mg by nebulization every 6 (six) hours as needed for wheezing or shortness of breath. 06/28/20  Yes Lavera Guise, MD  losartan-hydrochlorothiazide (HYZAAR) 100-12.5 MG tablet Take 1 tablet by mouth daily.   Yes [provider]  montelukast  (SINGULAIR) 10 MG tablet TAKE 1 TABLET(10 MG) BY MOUTH AT BEDTIME Patient taking differently: Take 10 mg by mouth daily. 10/30/20  Yes Luiz Ochoa, NP  QUEtiapine (SEROQUEL) 25 MG tablet Take 1 tablet (25 mg total) by mouth at bedtime. 11/19/20  Yes Luiz Ochoa, NP  roflumilast (DALIRESP) 500 MCG TABS tablet TAKE 1 TABLET(500 MCG) BY MOUTH DAILY Patient taking differently: Take 500 mcg by mouth daily. 10/30/20  Yes Luiz Ochoa, NP  tiotropium (SPIRIVA) 18 MCG inhalation capsule Place 1 capsule (18 mcg total) into inhaler and inhale daily. 11/19/20  Yes Luiz Ochoa, NP  cetirizine (ZYRTEC) 10 MG tablet TAKE 1 TABLET(10 MG) BY MOUTH DAILY Patient not taking: No sig reported 12/05/19   Kendell Bane, NP  levofloxacin (LEVAQUIN) 500 MG tablet Take 1 tablet (500 mg total) by mouth daily. Patient not taking: No sig reported 01/17/21   Luiz Ochoa, NP  predniSONE (DELTASONE) 10 MG tablet Take 1 tablet three times a day with a meal for three for three days, take 1 tablet by twice daily with a meal for 3 days, take 1 tablet once daily with a meal for 3 days Patient not taking: No sig reported 01/17/21   Luiz Ochoa, NP   Allergies  Allergen Reactions  . Crestor [Rosuvastatin] Nausea And Vomiting    Stopped medication 07/15/20  . Other Hives    trilogy  . Trelegy Ellipta [Fluticasone-Umeclidin-Vilant] Anaphylaxis  . Sulfa Antibiotics   . Sulfasalazine Hives   Review of Systems  Unable to perform ROS   Physical Exam Constitutional:      Comments: Sedated and on ventilator.      Vital Signs: BP 129/67 (BP Location: Right Arm)   Pulse 78   Temp (!) 101.3 F (38.5 C)   Resp (!) 26   Ht _0  (1.626 m)  Wt 84.9 kg   SpO2 93%   BMI 32.13 kg/m  Pain Scale: CPOT   Pain Score: Asleep   SpO2: SpO2: 93 % O2 Device:SpO2: 93 % O2 Flow Rate: .O2 Flow Rate (L/min): 3 L/min  IO: Intake/output summary:   Intake/Output Summary (Last 24 hours) at 02/26/2021 1334 Last  data filed at 02/26/2021 1200 Gross per 24 hour  Intake 1131.78 ml  Output 2650 ml  Net -1518.22 ml    LBM: Last BM Date: 02/02/21 Baseline Weight: Weight: 81.6 kg Most recent weight: Weight: 84.9 kg         Time In: 11:20 Time Out: 12:30 Time Total: 70 min Greater than 50%  of this time was spent counseling and coordinating care related to the above assessment and plan.  Signed by: Asencion Gowda, NP   Please contact Palliative Medicine Team phone at [phone] for questions and concerns.  For individual provider: See Shea Evans

## 2021-03-06 NOTE — Progress Notes (Signed)
PHARMACY CONSULT NOTE - FOLLOW UP  Pharmacy Consult for Electrolyte Monitoring and Replacement   Recent Labs: Potassium (mmol/L)  Date Value  2021/02/15 5.3 (H)  07/04/2014 4.0   Magnesium (mg/dL)  Date Value  25/42/7062 2.4   Calcium (mg/dL)  Date Value  37/62/8315 8.4 (L)   Calcium, Total (mg/dL)  Date Value  17/61/6073 8.8   Albumin (g/dL)  Date Value  71/03/2693 3.2 (L)  07/04/2014 3.8   Phosphorus (mg/dL)  Date Value  85/46/2703 3.8   Sodium (mmol/L)  Date Value  Feb 15, 2021 146 (H)  07/04/2014 135 (L)   Corrected Ca: 9.14 mg/dL  Assessment: 65 y.o. female with medical history significant for HTN, COPD and anxiety presented with a  COPD exacerbation and transferred fto CCU requiring intubation.  She has had persistent hyperkalemia. Patient has received 10u IV insulin x2 + amp D50 x2 + 10g PO lokelma x3. Pharmacy was asked to follow electrolytes and replace.  Free water q4h PROSource 59mL QID Vital High Protein @35mL /hr  Goal of Therapy:  Electrolytes WNL  Plan:   K 5.3 s/p 10u IV insulin x2 + amp D50 x2 + 10g PO lokelma x3; d/w MD - will start lactulose to help with bowel movement to get full effect of lokelma. Will follow up with dietary if tube feeds maybe contributing.   Na 146, slightly elevated - will monitor for now, if continues to increase may consider increasing free water   Re-check electrolytes with AM labs   , PharmD Pharmacy Resident  02/15/2021 9:02 AM

## 2021-03-06 NOTE — Progress Notes (Signed)
NAME:  Alicia Rivera, MRN:  [id number], DOB:  12-Sep-1956, LOS: 6 ADMISSION DATE:  01/29/2021, CONSULTATION DATE: 01/24/2021 REFERRING MD: Dr. Damita Dunnings, CHIEF COMPLAINT: Shortness of breath  History of Present Illness:  65 year old female with a known past medical history of COPD, anxiety & current smoker presenting to [facility] ED with several day history of wheezing and shortness of breath that became acutely worse overnight on 01/11/2021.  Per ED documentation she reported using her inhalers at home and denied any changes to her chronic cough, but did endorse chest tightness without chest pain.  She confirmed she is still smoking cigarettes.     Events: Including procedures, antibiotic start and stop dates in addition to other pertinent events   . 01/19/2021-admitted with TRH for COPD exacerbation requiring acute oxygen . 02/02/2021- urgently transferred to ICU due to respiratory distress requiring emergent RSI and mechanical ventilation . 02/03/2021-CTA Chest revealed subsegmental and distal lower lung branches are obscured by motion.  Elsewhere no pulmonary embolus identified.  Chronic Emphysema. Lower lung volumes with atelectasis, but no focal infection.  Osteopenia with new or progressed compression fractures of T8 through T10, T12, and L1 since 2019 . 02/03/21- patient remains on ventilator - met with family (two people son and daughter) they were very irate and wanted patient immediately moved to [facility] to be evaluted by COPD specialist. I have explained this is very unlikely to occur and spoke with [facility] to request transfer to accommodate family. However, no beds available at [facility] . 02/04/2021-will perform WUA and attempt SBT  . Continue to double stack on the ventilator and generating auto PEEP, placed on continuous albuterol . 02/06/2021- Remains critically ill, continues to be Asynchronous with vent, vent settings adjusted with some improvement in asynchrony  . 02/07/2021- Still with vent  asychrony, still with episodes of Bronchospasm, obtain CT head, Palliative Care consulted  Interim history/subjective  -Palliative consulted -Patient attempted SAT.  Became profoundly agitated with systolics in the 937D requiring reinitiation of sedation. Objective   Blood pressure 110/68, pulse 75, temperature (!) 100.58 F (38.1 C), resp. rate (!) 26, height 5' 4" (1.626 m), weight 84.9 kg, SpO2 96 %.    Vent Mode: PRVC FiO2 (%):  [35 %] 35 % Set Rate:  [24 bmp-26 bmp] 26 bmp Vt Set:  [370 mL-440 mL] 440 mL PEEP:  [5 cmH20-8 cmH20] 5 cmH20 Plateau Pressure:  [25 cmH20-26 cmH20] 26 cmH20   Intake/Output Summary (Last 24 hours) at Feb 25, 2021 1111 Last data filed at Feb 25, 2021 4287 Gross per 24 hour  Intake 1276.4 ml  Output 2250 ml  Net -973.6 ml   Filed Weights   01/22/2021 2259 02/02/21 2345 02/04/21 0325  Weight: 81.6 kg 84.4 kg 84.9 kg    Examination: General: Critically ill-appearing female, intubated and sedated on the ventilator, in no acute distress HEENT: Atraumatic, normocephalic, neck supple, no JVD, ET tube in place,MM pink and moist Neuro: Deeply sedated due to vent asynchrony, does not follow commands, pupils PERRLA CV: Regular rate and rhythm, S1-S2, no murmurs, rubs, gallops, 2+ distal pulses Pulm: Diffuse inspiratory and expiratory wheezing, no rales, asynchronous with the ventilator (but less than yesterday), even GI: Soft, nontender, nondistended, no guarding or rebound tenderness, bowel sounds positive x4 Skin: Limited exam-warm and dry.  No obvious rashes, lesions, ulcerations Extremities: No deformities, mild edema   IMAGING    Assessment & Plan:   Acute on chronic hypoxic Respiratory Failure secondary to COPD exacerbation  -Full vent support, continue lung protective  strategies -Wean FiO2 and PEEP as tolerated maintain O2 sats 88 to 92% -Follow intermittent chest x-ray and ABG as needed -Implement VAP bundle -Spontaneous breathing trials when  respiratory parameters met  -Bronchodilators scheduled and PRN -Continue budesonide and Brovana nebs -Continue Solu-Medrol 60 mg daily decreased from 40 mg every 6 hours previously. -Completed 5 day of course of Azithromycin & Ceftriaxone on 5/5 for severe COPD exacerbation, tracheal aspirate with Normal respiratory flora   Acute kidney injury secondary to ATN  Hyperkalemia Urinary retention  -Monitor I&O's / urinary output -Follow BMP -Ensure adequate renal perfusion -Avoid nephrotoxic agents as able -Replace electrolytes as indicated -Will give 10 units insulin + D50, and Lokelma -Added lactulose to help encourage stooling which will be helpful with [place] working properly and potassium excretion.  Anemia of critical illness -Monitor for S/Sx of bleeding -Trend CBC -Lovenox for VTE Prophylaxis  -Transfuse for Hgb <7  Steroid induced hyperglycemia  -CBG q4hrs  -SSI  -Decrease steroids to 60 mg of Solu-Medrol from 240 previously.  Anxiety at baseline  Sedation needs in setting of mechanical Ventilation -Maintain a RASS goal of -3 due to vent asynchrony -Fentanyl, Precedex, & Propofol gtt's as needed -Daily wake up assessment -Continue scheduled outpatient xanax  -Continue Seroquel 50 mg twice daily.  QTC checked and within acceptable range. -Supportive care -Perhaps decreasing Solu-Medrol will help with any element of steroid-induced psychosis. -Have started 5 mg every 6 of Roxicodone to help offset any current analgesia needs that is only being addressed by fentanyl.  In hopes to aid with weaning of sedation.     Pt is critically ill.  Prognosis is very guarded.  High risk for cardiac arrest and death.  Low likelihood for weaning from ventilator and survival without Tracheostomy.  Had family discussion today 02-15-2021 with pt's youngest son (POA) and oldest daughter.  Discussed poor prognosis, lack of progress with weaning from vent, and high likelihood of needing  Trach  for survival.  They are aware of poor prognosis.  They are considering having a decision by next Monday which would be closer to 10 days of intubation.  At which point they would consider compassionate extubation versus trach.  Son who is the power of attorney is leaning towards compassionate extubation as he knows the patient would not want to be on long-term life support.  Palliative care will meet with patient later on this morning to afternoon.   Best practice (right click and "Reselect all SmartList Selections" daily)  Diet:  NPO, feeds Pain/Anxiety/Delirium protocol (if indicated): Yes RASS goal -2 VAP protocol (if indicated): Yes DVT prophylaxis: LMWH GI prophylaxis: H2B Glucose control:  SSI Central venous access:  N/A Arterial line:  N/A Foley: Yes,  Mobility:  bed rest  PT consulted: N/A Last date of multidisciplinary goals of care discussion Feb 15, 2021.   Code Status:  full code Disposition: ICU  Labs   CBC: Recent Labs  Lab 02/03/21 0414 02/04/21 0329 02/05/21 0436 02/06/21 0837 02/07/21 0425 02/15/2021 0346  WBC 28.7* 23.9* 16.0* 16.6* 13.9* 16.4*  NEUTROABS 25.9* 21.7* 13.6*  --   --   --   HGB 11.3* 11.0* 10.3* 10.6* 10.1* 11.8*  HCT 34.1* 34.2* 32.1* 33.3* 32.4* 37.2  MCV 95.8 97.7 98.8 99.7 101.6* 99.2  PLT 291 266 215 206 195 811    Basic Metabolic Panel: Recent Labs  Lab 02/03/21 0414 02/04/21 0329 02/04/21 9147 02/04/21 2108 02/05/21 8295 02/06/21 6213 02/06/21 1423 02/06/21 1753 02/07/21 0425 02/07/21 1348 02/15/21 0346  NA 131* 132*  --  136 136 137  --   --  143  --  146*  K 4.6 5.7*   < > 5.2* 5.2* 6.0* 5.6* 5.1 5.6* 5.2* 5.3*  CL 103 103  --  105 105 105  --   --  108  --  106  CO2 19* 22  --  _0 --   --  28  --  33*  GLUCOSE 175* 144*  --  138* 140* 132*  --   --  149*  --  139*  BUN 19 33*  --  31* 34* 52*  --   --  52*  --  56*  CREATININE 0.77 1.08*  --  0.86 0.85 0.99  --   --  0.74  --  0.78  CALCIUM 8.3* 8.5*  --  8.3* 8.1*  8.5*  --   --  8.5*  --  8.4*  MG 1.9 2.2  --   --  2.7*  --   --   --  2.7*  --  2.4  PHOS 3.8 3.2  --   --  3.4  --   --   --   --   --  3.8   < > = values in this interval not displayed.   GFR: Estimated Creatinine Clearance: 72 mL/min (by C-G formula based on SCr of 0.78 mg/dL). Recent Labs  Lab 01/14/2021 2300 02/03/21 0414 02/04/21 0329 02/05/21 0436 02/06/21 0837 02/07/21 0425 03-08-21 0346  PROCALCITON <0.10 <0.10 <0.10 <0.10  --   --   --   WBC 13.3* 28.7* 23.9* 16.0* 16.6* 13.9* 16.4*    Liver Function Tests: Recent Labs  Lab 02/03/21 0414 02/04/21 0329 02/05/21 0436  AST 22  --   --   ALT 19  --   --   ALKPHOS 54  --   --   BILITOT 0.7  --   --   PROT 6.1*  --   --   ALBUMIN 3.4* 3.6 3.2*   No results for input(s): LIPASE, AMYLASE in the last 168 hours. No results for input(s): AMMONIA in the last 168 hours.  ABG    Component Value Date/Time   PHART 7.31 (L) 02/07/2021 0500   PCO2ART 64 (H) 02/07/2021 0500   PO2ART 115 (H) 02/07/2021 0500   HCO3 32.2 (H) 02/07/2021 0500   ACIDBASEDEF 1.3 02/04/2021 2130   O2SAT 98.1 02/07/2021 0500     Coagulation Profile: No results for input(s): INR, PROTIME in the last 168 hours.  Cardiac Enzymes: No results for input(s): CKTOTAL, CKMB, CKMBINDEX, TROPONINI in the last 168 hours.  HbA1C: Hgb A1c MFr Bld  Date/Time Value Ref Range Status  02/03/2021 04:14 AM 5.7 (H) 4.8 - 5.6 % Final    Comment:    (NOTE) Pre diabetes:          5.7%-6.4%  Diabetes:              >6.4%  Glycemic control for   <7.0% adults with diabetes     CBG: Recent Labs  Lab 02/07/21 1611 02/07/21 1920 02/07/21 2310 2021-03-08 0318 03/08/21 0726  GLUCAP 152* 148* 128* 123* 153*    Critical Care time: 40 minutes  Newell Coral DO Internal Medicine/Pediatrics Pulmonary and Critical Care Fellow PGY-7

## 2021-03-06 NOTE — Progress Notes (Addendum)
Have spoken to [facility] who is the patients POA. They are in agreement with pursuing comfort care at this time. However before any big changes they would like the whole family to see patient. Additionally they want to make patient DNR for now and stop any lab draws or things that could potentially be hurtful to patient. Will make this so.  Have spoken to family to family. They wish to pursue compassionate extubation. Have put in order for this. Patient has been placed on morphine drip and has ativan as needed.   Vertis Kelch DO Internal Medicine/Pediatrics Pulmonary and Critical Care Fellow PGY-7

## 2021-03-06 NOTE — Progress Notes (Signed)
Patient transitioned to comfort care this afternoon. Patient resting comfortably.  Family at bedside.   Patient time of death 1904-01-17 pronounced by Jolyn Nap, MD.

## 2021-03-06 NOTE — Death Summary Note (Signed)
NAME:  Alicia Rivera, MRN:  [id number], DOB:  1956/03/04, LOS: 6 ADMISSION DATE:  01/29/2021, CONSULTATION DATE:  02/12/21 REFERRING MD:  , CHIEF COMPLAINT:  Respiratory failure  History of Present Illness:  65 year old female with a known past medical history of COPD, anxiety & current smoker presenting to [facility] ED with several day history of wheezing and shortness of breath that became acutely worse overnight on 01/05/2021.  Per ED documentation she reported using her inhalers at home and denied any changes to her chronic cough, but did endorse chest tightness without chest pain.  She confirmed she is still smoking cigarettes.     Pertinent  Medical History   01/13/2021-admitted with TRH for COPD exacerbation requiring acute oxygen  02/02/2021- urgently transferred to ICU due to respiratory distress requiring emergent RSI and mechanical ventilation  02/03/2021-CTA Chest revealed subsegmental and distal lower lung branches are obscured by motion.  Elsewhere no pulmonary embolus identified.  Chronic Emphysema. Lower lung volumes with atelectasis, but no focal infection.  Osteopenia with new or progressed compression fractures of T8 through T10, T12, and L1 since 2019  02/03/21- patient remains on ventilator - met with family (two people son and daughter) they were very irate and wanted patient immediately moved to [facility] to be evaluted by COPD specialist. I have explained this is very unlikely to occur and spoke with [facility] to request transfer to accommodate family. However, no beds available at [facility]  02/04/2021-will perform WUA and attempt SBT   Continue to double stack on the ventilator and generating auto PEEP, placed on continuous albuterol  02/06/2021- Remains critically ill, continues to be Asynchronous with vent, vent settings adjusted with some improvement in asynchrony   02/07/2021- Still with vent asychrony, still with episodes of Bronchospasm, obtain CT head, Palliative Care  consulted   Significant Hospital Events: Including procedures, antibiotic start and stop dates in addition to other pertinent events   -Palliative consulted -Patient attempted SAT.  Became profoundly agitated with systolics in the 259D requiring reinitiation of sedation.  Interim History / Subjective:  Patient was compassionately extubated on Feb 12, 2021. Shortly thereafter patient became profoundly hypoxic and than arrested.    Objective   Blood pressure 138/81, pulse 92, temperature (!) 102.02 F (38.9 C), resp. rate (!) 24, height _0  (1.626 m), weight 84.9 kg, SpO2 94 %.    Vent Mode: PRVC FiO2 (%):  [35 %] 35 % Set Rate:  [24 bmp-26 bmp] 26 bmp Vt Set:  [370 mL-440 mL] 440 mL PEEP:  [8 cmH20] 8 cmH20 Plateau Pressure:  [25 cmH20-26 cmH20] 26 cmH20   Intake/Output Summary (Last 24 hours) at 02-12-21 1847 Last data filed at 12-Feb-2021 1812 Gross per 24 hour  Intake 1280.5 ml  Output 3200 ml  Net -1919.5 ml   Filed Weights   01/06/2021 2259 02/02/21 2345 02/04/21 0325  Weight: 81.6 kg 84.4 kg 84.9 kg    Examination: prior to compassionate extubation General: Critically ill-appearing female, intubated and sedated on the ventilator, in no acute distress HEENT: Atraumatic, normocephalic, neck supple, no JVD, ET tube in place,MM pink and moist Neuro: Deeply sedated due to vent asynchrony, does not follow commands, pupils PERRLA CV: Regular rate and rhythm, S1-S2, no murmurs, rubs, gallops, 2+ distal pulses Pulm: Diffuse inspiratory and expiratory wheezing, no rales, asynchronous with the ventilator (but less than yesterday), even GI: Soft, nontender, nondistended, no guarding or rebound tenderness, bowel sounds positive x4 Skin: Limited exam-warm and dry.  No obvious rashes, lesions, ulcerations  Extremities: No deformities, mild edema  Death exam: Heart listened to for 1 minute.  Absent cardiac sounds on auscultation Lungs listened to 1 minute absent breath sounds  bilaterally Patient had no movement to painful stimuli or verbal stimuli Additionally there were no pupillary reflexes Patient's time of death was noted to be 7:05 PM.    Hospital summary   Patient was intubated on 02/02/21-She was treated for CAP with CTX and azithromycin. Additionally she was treated for COPD exacerbation with solumedrol. She continued to fail weaning trials unfortunately due to agitation. On 12-Feb-2021 patient had meeting with palliative care.  In meeting it was ascertained that patient would not have wanted to be on long-term life support.  Currently she is on life support and there does not seem to be any definitive endpoint insight.  With this in mind it was decided to make patient comfort care.  All family stopped by and was within patient in room they collectively made the decision to compassionately extubate patient.  Patient was compassionately extubated and shortly thereafter expired from hypoxic respiratory failure secondary to her severe COPD exacerbation and community-acquired pneumonia.   Labs   CBC: Recent Labs  Lab 02/03/21 0414 02/04/21 0329 02/05/21 0436 02/06/21 0837 02/07/21 0425 02/12/21 0346  WBC 28.7* 23.9* 16.0* 16.6* 13.9* 16.4*  NEUTROABS 25.9* 21.7* 13.6*  --   --   --   HGB 11.3* 11.0* 10.3* 10.6* 10.1* 11.8*  HCT 34.1* 34.2* 32.1* 33.3* 32.4* 37.2  MCV 95.8 97.7 98.8 99.7 101.6* 99.2  PLT 291 266 215 206 195 583    Basic Metabolic Panel: Recent Labs  Lab 02/03/21 0414 02/04/21 0329 02/04/21 0916 02/04/21 2108 02/05/21 0436 02/06/21 0837 02/06/21 1423 02/06/21 1753 02/07/21 0425 02/07/21 1348 Feb 12, 2021 0346  NA 131* 132*  --  136 136 137  --   --  143  --  146*  K 4.6 5.7*   < > 5.2* 5.2* 6.0* 5.6* 5.1 5.6* 5.2* 5.3*  CL 103 103  --  105 105 105  --   --  108  --  106  CO2 19* 22  --  _0 --   --  28  --  33*  GLUCOSE 175* 144*  --  138* 140* 132*  --   --  149*  --  139*  BUN 19 33*  --  31* 34* 52*  --   --  52*  --  56*   CREATININE 0.77 1.08*  --  0.86 0.85 0.99  --   --  0.74  --  0.78  CALCIUM 8.3* 8.5*  --  8.3* 8.1* 8.5*  --   --  8.5*  --  8.4*  MG 1.9 2.2  --   --  2.7*  --   --   --  2.7*  --  2.4  PHOS 3.8 3.2  --   --  3.4  --   --   --   --   --  3.8   < > = values in this interval not displayed.   GFR: Estimated Creatinine Clearance: 72 mL/min (by C-G formula based on SCr of 0.78 mg/dL). Recent Labs  Lab 01/28/2021 2300 02/03/21 0414 02/04/21 0329 02/05/21 0436 02/06/21 0837 02/07/21 0425 02/12/21 0346  PROCALCITON <0.10 <0.10 <0.10 <0.10  --   --   --   WBC 13.3* 28.7* 23.9* 16.0* 16.6* 13.9* 16.4*    Liver Function Tests: Recent Labs  Lab 02/03/21 0414  02/04/21 0329 02/05/21 0436  AST 22  --   --   ALT 19  --   --   ALKPHOS 54  --   --   BILITOT 0.7  --   --   PROT 6.1*  --   --   ALBUMIN 3.4* 3.6 3.2*   No results for input(s): LIPASE, AMYLASE in the last 168 hours. No results for input(s): AMMONIA in the last 168 hours.  ABG    Component Value Date/Time   PHART 7.31 (L) 02/07/2021 0500   PCO2ART 64 (H) 02/07/2021 0500   PO2ART 115 (H) 02/07/2021 0500   HCO3 32.2 (H) 02/07/2021 0500   ACIDBASEDEF 1.3 02/04/2021 2130   O2SAT 98.1 02/07/2021 0500     Coagulation Profile: No results for input(s): INR, PROTIME in the last 168 hours.  Cardiac Enzymes: No results for input(s): CKTOTAL, CKMB, CKMBINDEX, TROPONINI in the last 168 hours.  HbA1C: Hgb A1c MFr Bld  Date/Time Value Ref Range Status  02/03/2021 04:14 AM 5.7 (H) 4.8 - 5.6 % Final    Comment:    (NOTE) Pre diabetes:          5.7%-6.4%  Diabetes:              >6.4%  Glycemic control for   <7.0% adults with diabetes     CBG: Recent Labs  Lab 02/07/21 1920 02/07/21 2310 Mar 02, 2021 0318 2021-03-02 0726 03/02/2021 1136  GLUCAP 148* 128* 123* 153* 143*      Past Medical History:  She,  has a past medical history of Anxiety, COPD (chronic obstructive pulmonary disease) ([place]), HTN (hypertension),  Hyperlipidemia, Ischemic cerebrovascular accident (CVA) ([place]) (10/2020), Pneumonia (09/18/2017), TIA (transient ischemic attack), and Tobacco abuse.   Surgical History:   Past Surgical History:  Procedure Laterality Date  . CATARACT EXTRACTION W/PHACO Left 12/13/2019   Procedure: CATARACT EXTRACTION PHACO AND INTRAOCULAR LENS PLACEMENT ([place]) LEFT;  Surgeon: Birder Robson, MD;  Location: [place];  Service: Ophthalmology;  Laterality: Left;  CDE 4.90 U/S 0:36.8  . CATARACT EXTRACTION W/PHACO Right 01/03/2020   Procedure: CATARACT EXTRACTION PHACO AND INTRAOCULAR LENS PLACEMENT ([facility]) RIGHT 6.01 00:44.5;  Surgeon: Birder Robson, MD;  Location: [place];  Service: Ophthalmology;  Laterality: Right;     Social History:   reports that she has been smoking cigarettes. She has smoked for the past 50.00 years. She has never used smokeless tobacco. She reports that she does not drink alcohol and does not use drugs.   Family History:  Her family history includes Emphysema in her brother; Heart disease in her father; Lung disease in her mother.   Allergies Allergies  Allergen Reactions  . Crestor [Rosuvastatin] Nausea And Vomiting    Stopped medication 07/15/20  . Other Hives    trilogy  . Trelegy Ellipta [Fluticasone-Umeclidin-Vilant] Anaphylaxis  . Sulfa Antibiotics   . Sulfasalazine Hives     Home Medications  Prior to Admission medications   Medication Sig Start Date End Date Taking? Authorizing Provider  albuterol (VENTOLIN HFA) 108 (90 Base) MCG/ACT inhaler Inhale 2 puffs into the lungs every 4 (four) hours as needed for wheezing or shortness of breath. 01/22/21  Yes Allyne Gee, MD  ALPRAZolam Duanne Moron) 0.25 MG tablet Take 1 tablet (0.25 mg total) by mouth 2 (two) times daily as needed for anxiety. 01/22/21  Yes Allyne Gee, MD  aspirin 81 MG chewable tablet Chew 81 mg by mouth daily. 01/09/21  Yes [provider]  atorvastatin (LIPITOR)  80 MG  tablet Take 80 mg by mouth daily. 12/10/20  Yes [provider]  budesonide-formoterol (SYMBICORT) 160-4.5 MCG/ACT inhaler Inhale 1 puff into the lungs 2 (two) times daily. 11/19/20  Yes Luiz Ochoa, NP  DULoxetine (CYMBALTA) 30 MG capsule Take 1 capsule (30 mg total) by mouth daily. 12/10/20  Yes Luiz Ochoa, NP  ipratropium (ATROVENT) 0.02 % nebulizer solution USE 1 VIAL VIA NEBULIZER EVERY 6 HOURS AS NEEDED FOR WHEEZING OR SHORTNESS OF BREATH Patient taking differently: Take 0.5 mg by nebulization every 6 (six) hours as needed for wheezing or shortness of breath. 06/28/20  Yes Lavera Guise, MD  losartan-hydrochlorothiazide (HYZAAR) 100-12.5 MG tablet Take 1 tablet by mouth daily.   Yes [provider]  montelukast (SINGULAIR) 10 MG tablet TAKE 1 TABLET(10 MG) BY MOUTH AT BEDTIME Patient taking differently: Take 10 mg by mouth daily. 10/30/20  Yes Luiz Ochoa, NP  QUEtiapine (SEROQUEL) 25 MG tablet Take 1 tablet (25 mg total) by mouth at bedtime. 11/19/20  Yes Luiz Ochoa, NP  roflumilast (DALIRESP) 500 MCG TABS tablet TAKE 1 TABLET(500 MCG) BY MOUTH DAILY Patient taking differently: Take 500 mcg by mouth daily. 10/30/20  Yes Luiz Ochoa, NP  tiotropium (SPIRIVA) 18 MCG inhalation capsule Place 1 capsule (18 mcg total) into inhaler and inhale daily. 11/19/20  Yes Luiz Ochoa, NP  cetirizine (ZYRTEC) 10 MG tablet TAKE 1 TABLET(10 MG) BY MOUTH DAILY Patient not taking: No sig reported 12/05/19   Kendell Bane, NP  levofloxacin (LEVAQUIN) 500 MG tablet Take 1 tablet (500 mg total) by mouth daily. Patient not taking: No sig reported 01/17/21   Luiz Ochoa, NP  predniSONE (DELTASONE) 10 MG tablet Take 1 tablet three times a day with a meal for three for three days, take 1 tablet by twice daily with a meal for 3 days, take 1 tablet once daily with a meal for 3 days Patient not taking: No sig reported 01/17/21   Luiz Ochoa, NP     Critical care time:  27

## 2021-03-06 DEATH — deceased

## 2021-03-21 ENCOUNTER — Ambulatory Visit: Payer: Medicare Other | Admitting: Internal Medicine

## 2021-05-28 ENCOUNTER — Ambulatory Visit: Payer: Medicare Other | Admitting: Internal Medicine

## 2021-06-28 ENCOUNTER — Ambulatory Visit: Payer: Medicare Other | Admitting: Hospice and Palliative Medicine
